# Patient Record
Sex: Female | Born: 1935
Health system: Southern US, Community
[De-identification: ages and names within clinical notes are randomized; demographics above are authoritative.]

## PROBLEM LIST (undated history)

## (undated) DIAGNOSIS — H548 Legal blindness, as defined in USA: Secondary | ICD-10-CM

## (undated) DIAGNOSIS — K5900 Constipation, unspecified: Secondary | ICD-10-CM

## (undated) DIAGNOSIS — S32000A Wedge compression fracture of unspecified lumbar vertebra, initial encounter for closed fracture: Secondary | ICD-10-CM

## (undated) DIAGNOSIS — M1990 Unspecified osteoarthritis, unspecified site: Secondary | ICD-10-CM

## (undated) DIAGNOSIS — M549 Dorsalgia, unspecified: Secondary | ICD-10-CM

## (undated) DIAGNOSIS — S72009A Fracture of unspecified part of neck of unspecified femur, initial encounter for closed fracture: Secondary | ICD-10-CM

## (undated) DIAGNOSIS — E871 Hypo-osmolality and hyponatremia: Secondary | ICD-10-CM

## (undated) DIAGNOSIS — M810 Age-related osteoporosis without current pathological fracture: Secondary | ICD-10-CM

## (undated) DIAGNOSIS — F419 Anxiety disorder, unspecified: Secondary | ICD-10-CM

## (undated) DIAGNOSIS — K219 Gastro-esophageal reflux disease without esophagitis: Secondary | ICD-10-CM

## (undated) DIAGNOSIS — R739 Hyperglycemia, unspecified: Secondary | ICD-10-CM

## (undated) DIAGNOSIS — C50912 Malignant neoplasm of unspecified site of left female breast: Secondary | ICD-10-CM

## (undated) DIAGNOSIS — S72142A Displaced intertrochanteric fracture of left femur, initial encounter for closed fracture: Secondary | ICD-10-CM

## (undated) DIAGNOSIS — R7303 Prediabetes: Secondary | ICD-10-CM

## (undated) DIAGNOSIS — H3530 Unspecified macular degeneration: Secondary | ICD-10-CM

## (undated) HISTORY — DX: Displaced intertrochanteric fracture of left femur, initial encounter for closed fracture: S72.142A

## (undated) HISTORY — PX: TONSILLECTOMY: SUR1361

## (undated) HISTORY — DX: Constipation, unspecified: K59.00

## (undated) HISTORY — DX: Hyperglycemia, unspecified: R73.9

## (undated) HISTORY — DX: Fracture of unspecified part of neck of unspecified femur, initial encounter for closed fracture: S72.009A

## (undated) HISTORY — PX: CATARACT EXTRACTION: SUR2

## (undated) HISTORY — DX: Hypo-osmolality and hyponatremia: E87.1

---

## 2007-08-28 ENCOUNTER — Encounter: Admission: RE | Admit: 2007-08-28 | Discharge: 2007-08-28 | Payer: Self-pay | Admitting: Orthopedic Surgery

## 2011-06-16 DIAGNOSIS — H31012 Macula scars of posterior pole (postinflammatory) (post-traumatic), left eye: Secondary | ICD-10-CM | POA: Insufficient documentation

## 2011-06-28 DIAGNOSIS — S32000A Wedge compression fracture of unspecified lumbar vertebra, initial encounter for closed fracture: Secondary | ICD-10-CM

## 2011-06-28 HISTORY — DX: Wedge compression fracture of unspecified lumbar vertebra, initial encounter for closed fracture: S32.000A

## 2011-11-04 ENCOUNTER — Emergency Department (HOSPITAL_COMMUNITY): Payer: Medicare Other

## 2011-11-04 ENCOUNTER — Encounter (HOSPITAL_COMMUNITY): Payer: Self-pay | Admitting: *Deleted

## 2011-11-04 ENCOUNTER — Emergency Department (HOSPITAL_COMMUNITY)
Admission: EM | Admit: 2011-11-04 | Discharge: 2011-11-04 | Disposition: A | Discharge status: Home / Self Care | Payer: Medicare Other | Attending: Emergency Medicine | Admitting: Emergency Medicine

## 2011-11-04 DIAGNOSIS — S32009A Unspecified fracture of unspecified lumbar vertebra, initial encounter for closed fracture: Secondary | ICD-10-CM | POA: Insufficient documentation

## 2011-11-04 DIAGNOSIS — M545 Low back pain, unspecified: Secondary | ICD-10-CM | POA: Insufficient documentation

## 2011-11-04 DIAGNOSIS — K5903 Drug induced constipation: Secondary | ICD-10-CM

## 2011-11-04 DIAGNOSIS — S32000A Wedge compression fracture of unspecified lumbar vertebra, initial encounter for closed fracture: Secondary | ICD-10-CM

## 2011-11-04 DIAGNOSIS — K5909 Other constipation: Secondary | ICD-10-CM | POA: Insufficient documentation

## 2011-11-04 DIAGNOSIS — X503XXA Overexertion from repetitive movements, initial encounter: Secondary | ICD-10-CM | POA: Insufficient documentation

## 2011-11-04 DIAGNOSIS — M129 Arthropathy, unspecified: Secondary | ICD-10-CM | POA: Insufficient documentation

## 2011-11-04 HISTORY — DX: Unspecified osteoarthritis, unspecified site: M19.90

## 2011-11-04 HISTORY — DX: Dorsalgia, unspecified: M54.9

## 2011-11-04 LAB — URINALYSIS, ROUTINE W REFLEX MICROSCOPIC
Glucose, UA: NEGATIVE mg/dL
Ketones, ur: 40 mg/dL — AB
Protein, ur: NEGATIVE mg/dL

## 2011-11-04 LAB — OCCULT BLOOD, POC DEVICE: Fecal Occult Bld: NEGATIVE

## 2011-11-04 MED ORDER — LUBIPROSTONE 24 MCG PO CAPS: 24.0000 ug | ORAL_CAPSULE | Freq: Two times a day (BID) | ORAL | Status: AC

## 2011-11-04 MED ORDER — DIPHENHYDRAMINE HCL 25 MG PO CAPS
ORAL_CAPSULE | ORAL | Status: AC
  Administered 2011-11-04: 12:00:00
  Filled 2011-11-04: qty 1

## 2011-11-04 MED ORDER — POLYETHYLENE GLYCOL 3350 17 GM/SCOOP PO POWD: 17.0000 g | Freq: Every day | ORAL | Status: AC

## 2011-11-04 MED ORDER — MORPHINE SULFATE 4 MG/ML IJ SOLN
4.0000 mg | Freq: Once | INTRAMUSCULAR | Status: AC
  Administered 2011-11-04: 4 mg via INTRAMUSCULAR
  Filled 2011-11-04: qty 1

## 2011-11-04 NOTE — ED Notes (Signed)
Patient transported to CT 

## 2011-11-04 NOTE — ED Notes (Signed)
Per ems: pt is having lower pain x 1. Pt is moving plants in her yard. Pt went to her pcp for pain no relief. Pt states she is also having decrease in appetite and constipation

## 2011-11-04 NOTE — ED Notes (Signed)
AVW:[id number]> Expected date:11/04/11<BR> Expected time: 9:14 AM<BR> Means of arrival:Ambulance<BR> Comments:<BR> Elderly,lower back pain

## 2011-11-04 NOTE — ED Provider Notes (Signed)
History     CSN: [id number]  Arrival date & time 11/04/11  7846   First MD Initiated Contact with Patient 11/04/11 [id number]      Chief Complaint  Patient presents with  . Back Pain    (Consider location/radiation/quality/duration/timing/severity/associated sxs/prior treatment) HPI  A generally healthy 78 year old female with history of arthritis presents with low back pain. Patient states for the past week she has been having pain to the low back. She described pain as a sharp and aching sensation worsening with movement and improves with rest. Pain initially started in the morning and at night but now has been ongoing throughout the day. Pain is nonradiating. Pain initially started with spasm sensation, worsening with positional changes.  Patient admits to having injured her back she was young due to heavy lifting. Sts she noticed that her back occasionally felt "weak, with occasional aches" but usually resolves with taking tylenol. Patient is very active and has been doing yard work more so than usual for the past several weeks.  She also has a job that has arthritis which requires for her to carry her 30lbs dog at least 8 times daily in and out of the house.  Pt also walks 45 minutes each day, which she is trying to continue walking but her back has been bothersome which limits her walk.  Pt was seen by her pcp for her pain and was given vicodin and muscle relaxant.  Pt has been taking her medication with some relief. Patient also complaining of having constipation for the past week and states her normal bowel routine is once daily.  Sts she doesn't have the urge to go.  Pt denies fever, rash, urinary or bowel incontinence, and no caudal equina sxs.  Pt denies nightsweats, or unexplained weight loss.    Past Medical History  Diagnosis Date  . Back pain   . Arthritis     Past Surgical History  Procedure Date  . Tonsillectomy     No family history on file.  History  Substance Use Topics   . Smoking status: Former Games developer  . Smokeless tobacco: Not on file  . Alcohol Use: Yes     wine    OB History    Grav Para Term Preterm Abortions TAB SAB Ect Mult Living                  Review of Systems  All other systems reviewed and are negative.    Allergies  Review of patient's allergies indicates no known allergies.  Home Medications  No current outpatient prescriptions on file.  BP 142/68  Pulse 83  Temp(Src) 98 F (36.7 C) (Oral)  Resp 17  SpO2 100%  Physical Exam  Nursing note and vitals reviewed. Constitutional: She is oriented to person, place, and time. She appears well-developed and well-nourished. No distress.       Awake, alert, nontoxic appearance  HENT:  Head: Atraumatic.  Eyes: Conjunctivae are normal. Right eye exhibits no discharge. Left eye exhibits no discharge.  Neck: Neck supple.  Cardiovascular: Normal rate and regular rhythm.   Pulmonary/Chest: Effort normal. No respiratory distress. She exhibits no tenderness.  Abdominal: Soft. There is no tenderness. There is no rebound.  Genitourinary: Rectum normal. Rectal exam shows no mass and anal tone normal. Guaiac negative stool.       Chaperone present  Musculoskeletal: She exhibits no tenderness.       Cervical back: Normal.       Thoracic back:  Normal.       Lumbar back: She exhibits decreased range of motion, tenderness and bony tenderness. She exhibits no swelling, no edema, no deformity and no laceration.       ROM appears intact, no obvious focal weakness  Neurological: She is alert and oriented to person, place, and time. She has normal strength. No sensory deficit. She exhibits normal muscle tone. She displays a negative Romberg sign. Coordination and gait normal. GCS eye subscore is 4. GCS verbal subscore is 5. GCS motor subscore is 6.  Reflex Scores:      Patellar reflexes are 2+ on the right side and 2+ on the left side.      Mental status and motor strength appears intact.  No foot  drop  Skin: No rash noted.  Psychiatric: She has a normal mood and affect.    ED Course  Procedures (including critical care time)  Labs Reviewed - No data to display No results found.   No diagnosis found.  Results for orders placed during the hospital encounter of 11/04/11  URINALYSIS, ROUTINE W REFLEX MICROSCOPIC      Component Value Range   Color, Urine YELLOW  YELLOW    APPearance CLOUDY (*) CLEAR    Specific Gravity, Urine 1.022  1.005 - 1.030    pH 7.0  5.0 - 8.0    Glucose, UA NEGATIVE  NEGATIVE (mg/dL)   Hgb urine dipstick NEGATIVE  NEGATIVE    Bilirubin Urine NEGATIVE  NEGATIVE    Ketones, ur 40 (*) NEGATIVE (mg/dL)   Protein, ur NEGATIVE  NEGATIVE (mg/dL)   Urobilinogen, UA 1.0  0.0 - 1.0 (mg/dL)   Nitrite NEGATIVE  NEGATIVE    Leukocytes, UA SMALL (*) NEGATIVE   URINE MICROSCOPIC-ADD ON      Component Value Range   Squamous Epithelial / LPF RARE  RARE    WBC, UA 0-2  <3 (WBC/hpf)   RBC / HPF 0-2  <3 (RBC/hpf)  OCCULT BLOOD, POC DEVICE      Component Value Range   Fecal Occult Bld NEGATIVE     Dg Lumbar Spine Complete  11/04/2011  *RADIOLOGY REPORT*  Clinical Data: Low back pain  LUMBAR SPINE - COMPLETE 4+ VIEW  Comparison: None.  Findings: Osteopenia.  Anatomic alignment.  There are compression deformities at L1 and T12.  Anterior cortical step off occurs at L1 worrisome for an acute element.  There is 20% loss of height anteriorly without obvious retropulsion.  T12 compression fracture has a more chronic appearance with 30% loss of height anteriorly and no obvious retropulsion.  Mild superior end plate depression at L4 is suspected with anterior cortical buckling.  IMPRESSION: Compression fractures of T12, L1, and L4 as described.  Superior endplate compression at L1 may be acute.  Further characterization with MRI can be performed.  Age of the T12 and L4 compression fractures are indeterminate.  Original Report Authenticated By: Donavan Burnet, M.D.   Dg Abd  Acute W/chest  11/04/2011  *RADIOLOGY REPORT*  Clinical Data: Abdominal pain with loss of left-sided constipation for past week.  ACUTE ABDOMEN SERIES (ABDOMEN 2 VIEW & CHEST 1 VIEW)  Comparison: Lumbar spine plain films same date.  No comparison chest x-ray.  Findings: Hyperinflation lungs with findings suggesting chronic obstructive pulmonary type changes with minimal peribronchial thickening. No infiltrate, congestive heart failure or pneumothorax.  No plain film evidence of pulmonary malignancy.  If this were of concern, CT imaging can be obtained for further delineation.  Heart size within normal limits.  Compression deformities lower thoracic / upper lumbar spine as seen on accompanying lumbar spine plain films.  Moderate amount of stool throughout the right colon / proximal transverse colon.  Gas filled slightly prominent sized small bowel loops.  No free intraperitoneal air.  IMPRESSION: Nonspecific bowel gas pattern with prominent amount of stool in the right colon and gas filled prominent sized small bowel loops. Please see above discussion.  Original Report Authenticated By: Fuller Canada, M.D.      MDM  Back pain likely secondary to inflammation due to arthralgia.  Constipation likely secondary to recent narcotic use.  Will perform rectal exam to r/o obstruction or impaction.  No red flags sign.    11:43 AM L-spine x-ray shows evidence of a compression fracture at T12, L1, and L4 of unknown age and a suspected acute fracture at L1.  My attending has seen xray and has discussed with patient.  Plan to have pt f/u with [facility] orthopedic for further management.  Discussion of kyphoplasty as option, pt acknowledge.  Will dc with stool softener, laxative and referral. Pt voice understanding.       Fayrene Helper, PA-C 11/04/11 1158

## 2011-11-04 NOTE — Discharge Instructions (Signed)
It appears that you have compression fracture of your lower back at T12, L1, and L4.  Please follow up with your orthopedic doctor for further evaluation.  Your constipation is likely due to pain medication.  Take stool softener as prescribed.  Return to ER if your symptoms worsen.  Avoid heavy lifting or strenuous work in the mean time.    Back, Compression Fracture A compression fracture happens when a force is put upon the length of your spine. Slipping and falling on your bottom are examples of such a force. When this happens, sometimes the force is great enough to compress the building blocks (vertebral bodies) of your spine. Although this causes a lot of pain, this can usually be treated at home, unless your caregiver feels hospitalization is needed for pain control. Your backbone (spinal column) is made up of 24 main vertebral bodies in addition to the sacrum and coccyx (see illustration). These are held together by tough fibrous tissues (ligaments) and by support of your muscles. Nerve roots pass through the openings between the vertebrae. A sudden wrenching move, injury, or a fall may cause a compression fracture of one of the vertebral bodies. This may result in back pain or spread of pain into the belly (abdomen), the buttocks, and down the leg into the foot. Pain may also be created by muscle spasm alone. Large studies have been undertaken to determine the best possible course of action to help your back following injury and also to prevent future problems. The recommendations are as follows. FOLLOWING A COMPRESSION FRACTURE: Do the following only if advised by your caregiver.   If a back brace has been suggested or provided, wear it as directed.   DO NOT stop wearing the back brace unless instructed by your caregiver.   When allowed to return to regular activities, avoid a sedentary life style. Actively exercise. Sporadic weekend binges of tennis, racquetball, water skiing, may actually  aggravate or create problems, especially if you are not in condition for that activity.   Avoid sports requiring sudden body movements until you are in condition for them. Swimming and walking are safer activities.   Maintain good posture.   Avoid obesity.   If not already done, you should have a DEXA scan. Based on the results, be treated for osteoporosis.  FOLLOWING ACUTE (SUDDEN) INJURY:  Only take over-the-counter or prescription medicines for pain, discomfort, or fever as directed by your caregiver.   Use bed rest for only the most extreme acute episode. Prolonged bed rest may aggravate your condition. Ice used for acute conditions is effective. Use a large plastic bag filled with ice. Wrap it in a towel. This also provides excellent pain relief. This may be continuous. Or use it for 30 minutes every 2 hours during acute phase, then as needed. Heat for 30 minutes prior to activities is helpful.   As soon as the acute phase (the time when your back is too painful for you to do normal activities) is over, it is important to resume normal activities and work Arboriculturist. Back injuries can cause potentially marked changes in lifestyle. So it is important to attack these problems aggressively.   See your caregiver for continued problems. He or she can help or refer you for appropriate exercises, physical therapy and work hardening if needed.   If you are given narcotic medications for your condition, for the next 24 hours DO NOT:   Drive   Operate machinery or power tools.  Sign legal documents.   DO NOT drink alcohol, take sleeping pills or other medications that may interfere with treatment.  If your caregiver has given you a follow-up appointment, it is very important to keep that appointment. Not keeping the appointment could result in a chronic or permanent injury, pain, and disability. If there is any problem keeping the appointment, you must call back to this facility for  assistance.  SEEK IMMEDIATE MEDICAL CARE IF:  You develop numbness, tingling, weakness, or problems with the use of your arms or legs.   You develop severe back pain not relieved with medications.   You have changes in bowel or bladder control.   You have increasing pain in any areas of the body.  Document Released: 06/13/2005 Document Revised: 06/02/2011 Document Reviewed: 01/16/2008 [facility] Patient Information 2012 [place], [place].  Constipation in Adults Constipation is having fewer than 2 bowel movements per week. Usually, the stools are hard. As we grow older, constipation is more common. If you try to fix constipation with laxatives, the problem may get worse. This is because laxatives taken over a long period of time make the colon muscles weaker. A low-fiber diet, not taking in enough fluids, and taking some medicines may make these problems worse. MEDICATIONS THAT MAY CAUSE CONSTIPATION  Water pills (diuretics).   Calcium channel blockers (used to control blood pressure and for the heart).   Certain pain medicines (narcotics).   Anticholinergics.   Anti-inflammatory agents.   Antacids that contain aluminum.  DISEASES THAT CONTRIBUTE TO CONSTIPATION  Diabetes.   Parkinson's disease.   Dementia.   Stroke.   Depression.   Illnesses that cause problems with salt and water metabolism.  HOME CARE INSTRUCTIONS   Constipation is usually best cared for without medicines. Increasing dietary fiber and eating more fruits and vegetables is the best way to manage constipation.   Slowly increase fiber intake to 25 to 38 grams per day. Whole grains, fruits, vegetables, and legumes are good sources of fiber. A dietitian can further help you incorporate high-fiber foods into your diet.   Drink enough water and fluids to keep your urine clear or pale yellow.   A fiber supplement may be added to your diet if you cannot get enough fiber from foods.   Increasing your activities  also helps improve regularity.   Suppositories, as suggested by your caregiver, will also help. If you are using antacids, such as aluminum or calcium containing products, it will be helpful to switch to products containing magnesium if your caregiver says it is okay.   If you have been given a liquid injection (enema) today, this is only a temporary measure. It should not be relied on for treatment of longstanding (chronic) constipation.   Stronger measures, such as magnesium sulfate, should be avoided if possible. This may cause uncontrollable diarrhea. Using magnesium sulfate may not allow you time to make it to the bathroom.  SEEK IMMEDIATE MEDICAL CARE IF:   There is bright red blood in the stool.   The constipation stays for more than 4 days.   There is belly (abdominal) or rectal pain.   You do not seem to be getting better.   You have any questions or concerns.  MAKE SURE YOU:   Understand these instructions.   Will watch your condition.   Will get help right away if you are not doing well or get worse.  Document Released: 03/11/2004 Document Revised: 06/02/2011 Document Reviewed: 05/17/2011 [facility] Patient Information 2012 [place]. 

## 2011-11-05 NOTE — ED Provider Notes (Signed)
Medical screening examination/treatment/procedure(s) were conducted as a shared visit with non-physician practitioner(s) and myself.  I personally evaluated the patient during the encounter Pt w low back pain, worse w bending, movement/turning. Compression fxs on xr. No numbness/weakness. No gi or gu c/o. abd no puls mass, no tenderness. Pain rx.   Suzi Roots, MD 11/05/11 [phone]

## 2013-03-20 DIAGNOSIS — H401114 Primary open-angle glaucoma, right eye, indeterminate stage: Secondary | ICD-10-CM | POA: Insufficient documentation

## 2013-07-11 DIAGNOSIS — H353290 Exudative age-related macular degeneration, unspecified eye, stage unspecified: Secondary | ICD-10-CM | POA: Diagnosis not present

## 2013-07-11 DIAGNOSIS — H2510 Age-related nuclear cataract, unspecified eye: Secondary | ICD-10-CM | POA: Diagnosis not present

## 2013-07-11 DIAGNOSIS — H31019 Macula scars of posterior pole (postinflammatory) (post-traumatic), unspecified eye: Secondary | ICD-10-CM | POA: Diagnosis not present

## 2013-07-11 DIAGNOSIS — H4011X0 Primary open-angle glaucoma, stage unspecified: Secondary | ICD-10-CM | POA: Diagnosis not present

## 2013-07-11 DIAGNOSIS — H409 Unspecified glaucoma: Secondary | ICD-10-CM | POA: Diagnosis not present

## 2013-09-19 DIAGNOSIS — H353290 Exudative age-related macular degeneration, unspecified eye, stage unspecified: Secondary | ICD-10-CM | POA: Diagnosis not present

## 2013-11-06 DIAGNOSIS — H2510 Age-related nuclear cataract, unspecified eye: Secondary | ICD-10-CM | POA: Diagnosis not present

## 2013-11-06 DIAGNOSIS — H409 Unspecified glaucoma: Secondary | ICD-10-CM | POA: Diagnosis not present

## 2013-11-06 DIAGNOSIS — H3530 Unspecified macular degeneration: Secondary | ICD-10-CM | POA: Diagnosis not present

## 2013-11-06 DIAGNOSIS — H4011X0 Primary open-angle glaucoma, stage unspecified: Secondary | ICD-10-CM | POA: Diagnosis not present

## 2013-11-28 DIAGNOSIS — H353290 Exudative age-related macular degeneration, unspecified eye, stage unspecified: Secondary | ICD-10-CM | POA: Diagnosis not present

## 2013-11-29 DIAGNOSIS — H2510 Age-related nuclear cataract, unspecified eye: Secondary | ICD-10-CM | POA: Diagnosis not present

## 2013-12-05 DIAGNOSIS — I209 Angina pectoris, unspecified: Secondary | ICD-10-CM | POA: Diagnosis not present

## 2013-12-05 DIAGNOSIS — H4010X0 Unspecified open-angle glaucoma, stage unspecified: Secondary | ICD-10-CM | POA: Diagnosis not present

## 2013-12-05 DIAGNOSIS — H409 Unspecified glaucoma: Secondary | ICD-10-CM | POA: Diagnosis not present

## 2013-12-05 DIAGNOSIS — H2510 Age-related nuclear cataract, unspecified eye: Secondary | ICD-10-CM | POA: Diagnosis not present

## 2013-12-05 DIAGNOSIS — Z79899 Other long term (current) drug therapy: Secondary | ICD-10-CM | POA: Diagnosis not present

## 2013-12-05 DIAGNOSIS — H2589 Other age-related cataract: Secondary | ICD-10-CM | POA: Diagnosis not present

## 2013-12-05 DIAGNOSIS — H538 Other visual disturbances: Secondary | ICD-10-CM | POA: Diagnosis not present

## 2013-12-05 DIAGNOSIS — Z87891 Personal history of nicotine dependence: Secondary | ICD-10-CM | POA: Diagnosis not present

## 2013-12-06 DIAGNOSIS — Z961 Presence of intraocular lens: Secondary | ICD-10-CM | POA: Insufficient documentation

## 2013-12-17 DIAGNOSIS — H2510 Age-related nuclear cataract, unspecified eye: Secondary | ICD-10-CM | POA: Diagnosis not present

## 2013-12-17 DIAGNOSIS — H31019 Macula scars of posterior pole (postinflammatory) (post-traumatic), unspecified eye: Secondary | ICD-10-CM | POA: Diagnosis not present

## 2013-12-17 DIAGNOSIS — H409 Unspecified glaucoma: Secondary | ICD-10-CM | POA: Diagnosis not present

## 2013-12-17 DIAGNOSIS — Z79899 Other long term (current) drug therapy: Secondary | ICD-10-CM | POA: Diagnosis not present

## 2013-12-17 DIAGNOSIS — Z87891 Personal history of nicotine dependence: Secondary | ICD-10-CM | POA: Diagnosis not present

## 2013-12-17 DIAGNOSIS — H3530 Unspecified macular degeneration: Secondary | ICD-10-CM | POA: Diagnosis not present

## 2013-12-17 DIAGNOSIS — H2589 Other age-related cataract: Secondary | ICD-10-CM | POA: Diagnosis not present

## 2013-12-18 DIAGNOSIS — Z961 Presence of intraocular lens: Secondary | ICD-10-CM | POA: Diagnosis not present

## 2014-02-06 DIAGNOSIS — Z961 Presence of intraocular lens: Secondary | ICD-10-CM | POA: Insufficient documentation

## 2014-02-06 DIAGNOSIS — H353290 Exudative age-related macular degeneration, unspecified eye, stage unspecified: Secondary | ICD-10-CM | POA: Diagnosis not present

## 2014-02-07 DIAGNOSIS — H26491 Other secondary cataract, right eye: Secondary | ICD-10-CM | POA: Insufficient documentation

## 2014-04-08 DIAGNOSIS — Z23 Encounter for immunization: Secondary | ICD-10-CM | POA: Diagnosis not present

## 2014-04-17 DIAGNOSIS — Z961 Presence of intraocular lens: Secondary | ICD-10-CM | POA: Diagnosis not present

## 2014-04-17 DIAGNOSIS — H31012 Macula scars of posterior pole (postinflammatory) (post-traumatic), left eye: Secondary | ICD-10-CM | POA: Diagnosis not present

## 2014-04-17 DIAGNOSIS — H3532 Exudative age-related macular degeneration: Secondary | ICD-10-CM | POA: Diagnosis not present

## 2014-04-17 DIAGNOSIS — H4011X4 Primary open-angle glaucoma, indeterminate stage: Secondary | ICD-10-CM | POA: Diagnosis not present

## 2014-04-18 DIAGNOSIS — H26491 Other secondary cataract, right eye: Secondary | ICD-10-CM | POA: Diagnosis not present

## 2014-06-04 DIAGNOSIS — Z23 Encounter for immunization: Secondary | ICD-10-CM | POA: Diagnosis not present

## 2014-06-27 HISTORY — PX: HIP FRACTURE SURGERY: SHX118

## 2014-07-03 DIAGNOSIS — H31012 Macula scars of posterior pole (postinflammatory) (post-traumatic), left eye: Secondary | ICD-10-CM | POA: Diagnosis not present

## 2014-07-03 DIAGNOSIS — H4011X4 Primary open-angle glaucoma, indeterminate stage: Secondary | ICD-10-CM | POA: Diagnosis not present

## 2014-07-03 DIAGNOSIS — H3532 Exudative age-related macular degeneration: Secondary | ICD-10-CM | POA: Diagnosis not present

## 2014-07-03 DIAGNOSIS — Z961 Presence of intraocular lens: Secondary | ICD-10-CM | POA: Diagnosis not present

## 2014-09-18 DIAGNOSIS — H4011X4 Primary open-angle glaucoma, indeterminate stage: Secondary | ICD-10-CM | POA: Diagnosis not present

## 2014-09-18 DIAGNOSIS — Z961 Presence of intraocular lens: Secondary | ICD-10-CM | POA: Diagnosis not present

## 2014-09-18 DIAGNOSIS — H31012 Macula scars of posterior pole (postinflammatory) (post-traumatic), left eye: Secondary | ICD-10-CM | POA: Diagnosis not present

## 2014-09-18 DIAGNOSIS — H3532 Exudative age-related macular degeneration: Secondary | ICD-10-CM | POA: Diagnosis not present

## 2014-11-27 DIAGNOSIS — H3532 Exudative age-related macular degeneration: Secondary | ICD-10-CM | POA: Diagnosis not present

## 2015-03-05 DIAGNOSIS — H31012 Macula scars of posterior pole (postinflammatory) (post-traumatic), left eye: Secondary | ICD-10-CM | POA: Diagnosis not present

## 2015-03-05 DIAGNOSIS — H4011X4 Primary open-angle glaucoma, indeterminate stage: Secondary | ICD-10-CM | POA: Diagnosis not present

## 2015-03-05 DIAGNOSIS — Z961 Presence of intraocular lens: Secondary | ICD-10-CM | POA: Diagnosis not present

## 2015-03-05 DIAGNOSIS — H3532 Exudative age-related macular degeneration: Secondary | ICD-10-CM | POA: Diagnosis not present

## 2015-03-05 DIAGNOSIS — H2513 Age-related nuclear cataract, bilateral: Secondary | ICD-10-CM | POA: Diagnosis not present

## 2015-03-23 ENCOUNTER — Emergency Department (HOSPITAL_COMMUNITY): Payer: Medicare Other

## 2015-03-23 ENCOUNTER — Encounter (HOSPITAL_COMMUNITY): Payer: Self-pay | Admitting: Emergency Medicine

## 2015-03-23 ENCOUNTER — Inpatient Hospital Stay (HOSPITAL_COMMUNITY)
Admission: EM | Admit: 2015-03-23 | Discharge: 2015-03-26 | DRG: 481 | Disposition: A | Discharge status: Skilled Nursing Facility | Payer: Medicare Other | Attending: Family Medicine | Admitting: Family Medicine

## 2015-03-23 DIAGNOSIS — E440 Moderate protein-calorie malnutrition: Secondary | ICD-10-CM | POA: Diagnosis present

## 2015-03-23 DIAGNOSIS — M25552 Pain in left hip: Secondary | ICD-10-CM | POA: Diagnosis present

## 2015-03-23 DIAGNOSIS — S32000A Wedge compression fracture of unspecified lumbar vertebra, initial encounter for closed fracture: Secondary | ICD-10-CM | POA: Insufficient documentation

## 2015-03-23 DIAGNOSIS — E1165 Type 2 diabetes mellitus with hyperglycemia: Secondary | ICD-10-CM | POA: Diagnosis present

## 2015-03-23 DIAGNOSIS — W541XXA Struck by dog, initial encounter: Secondary | ICD-10-CM | POA: Diagnosis not present

## 2015-03-23 DIAGNOSIS — E871 Hypo-osmolality and hyponatremia: Secondary | ICD-10-CM | POA: Diagnosis present

## 2015-03-23 DIAGNOSIS — W1839XA Other fall on same level, initial encounter: Secondary | ICD-10-CM | POA: Diagnosis not present

## 2015-03-23 DIAGNOSIS — S72142C Displaced intertrochanteric fracture of left femur, initial encounter for open fracture type IIIA, IIIB, or IIIC: Secondary | ICD-10-CM | POA: Diagnosis not present

## 2015-03-23 DIAGNOSIS — S72102A Unspecified trochanteric fracture of left femur, initial encounter for closed fracture: Secondary | ICD-10-CM | POA: Diagnosis not present

## 2015-03-23 DIAGNOSIS — R739 Hyperglycemia, unspecified: Secondary | ICD-10-CM | POA: Diagnosis not present

## 2015-03-23 DIAGNOSIS — S72142A Displaced intertrochanteric fracture of left femur, initial encounter for closed fracture: Secondary | ICD-10-CM | POA: Diagnosis not present

## 2015-03-23 DIAGNOSIS — Z87891 Personal history of nicotine dependence: Secondary | ICD-10-CM

## 2015-03-23 DIAGNOSIS — S72142S Displaced intertrochanteric fracture of left femur, sequela: Secondary | ICD-10-CM | POA: Diagnosis not present

## 2015-03-23 DIAGNOSIS — M1990 Unspecified osteoarthritis, unspecified site: Secondary | ICD-10-CM | POA: Diagnosis present

## 2015-03-23 DIAGNOSIS — Z9181 History of falling: Secondary | ICD-10-CM | POA: Diagnosis not present

## 2015-03-23 DIAGNOSIS — M810 Age-related osteoporosis without current pathological fracture: Secondary | ICD-10-CM | POA: Diagnosis not present

## 2015-03-23 DIAGNOSIS — M6281 Muscle weakness (generalized): Secondary | ICD-10-CM | POA: Diagnosis not present

## 2015-03-23 DIAGNOSIS — Z0389 Encounter for observation for other suspected diseases and conditions ruled out: Secondary | ICD-10-CM | POA: Diagnosis not present

## 2015-03-23 DIAGNOSIS — M21259 Flexion deformity, unspecified hip: Secondary | ICD-10-CM | POA: Diagnosis not present

## 2015-03-23 DIAGNOSIS — Z4789 Encounter for other orthopedic aftercare: Secondary | ICD-10-CM | POA: Diagnosis not present

## 2015-03-23 DIAGNOSIS — R278 Other lack of coordination: Secondary | ICD-10-CM | POA: Diagnosis not present

## 2015-03-23 DIAGNOSIS — H548 Legal blindness, as defined in USA: Secondary | ICD-10-CM | POA: Diagnosis present

## 2015-03-23 DIAGNOSIS — E876 Hypokalemia: Secondary | ICD-10-CM | POA: Diagnosis not present

## 2015-03-23 DIAGNOSIS — R262 Difficulty in walking, not elsewhere classified: Secondary | ICD-10-CM | POA: Diagnosis not present

## 2015-03-23 DIAGNOSIS — Z09 Encounter for follow-up examination after completed treatment for conditions other than malignant neoplasm: Secondary | ICD-10-CM

## 2015-03-23 DIAGNOSIS — S72145D Nondisplaced intertrochanteric fracture of left femur, subsequent encounter for closed fracture with routine healing: Secondary | ICD-10-CM | POA: Diagnosis not present

## 2015-03-23 DIAGNOSIS — M79605 Pain in left leg: Secondary | ICD-10-CM | POA: Diagnosis not present

## 2015-03-23 DIAGNOSIS — M549 Dorsalgia, unspecified: Secondary | ICD-10-CM | POA: Insufficient documentation

## 2015-03-23 DIAGNOSIS — E119 Type 2 diabetes mellitus without complications: Secondary | ICD-10-CM | POA: Diagnosis not present

## 2015-03-23 DIAGNOSIS — S72002A Fracture of unspecified part of neck of left femur, initial encounter for closed fracture: Secondary | ICD-10-CM | POA: Diagnosis not present

## 2015-03-23 DIAGNOSIS — Z681 Body mass index (BMI) 19 or less, adult: Secondary | ICD-10-CM | POA: Diagnosis not present

## 2015-03-23 DIAGNOSIS — S72001A Fracture of unspecified part of neck of right femur, initial encounter for closed fracture: Secondary | ICD-10-CM | POA: Diagnosis not present

## 2015-03-23 DIAGNOSIS — Z79899 Other long term (current) drug therapy: Secondary | ICD-10-CM

## 2015-03-23 DIAGNOSIS — T148 Other injury of unspecified body region: Secondary | ICD-10-CM | POA: Diagnosis not present

## 2015-03-23 DIAGNOSIS — S72009A Fracture of unspecified part of neck of unspecified femur, initial encounter for closed fracture: Secondary | ICD-10-CM | POA: Diagnosis present

## 2015-03-23 DIAGNOSIS — S72132A Displaced apophyseal fracture of left femur, initial encounter for closed fracture: Secondary | ICD-10-CM | POA: Diagnosis not present

## 2015-03-23 HISTORY — DX: Age-related osteoporosis without current pathological fracture: M81.0

## 2015-03-23 HISTORY — DX: Anxiety disorder, unspecified: F41.9

## 2015-03-23 HISTORY — DX: Prediabetes: R73.03

## 2015-03-23 HISTORY — DX: Gastro-esophageal reflux disease without esophagitis: K21.9

## 2015-03-23 HISTORY — DX: Wedge compression fracture of unspecified lumbar vertebra, initial encounter for closed fracture: S32.000A

## 2015-03-23 HISTORY — DX: Unspecified macular degeneration: H35.30

## 2015-03-23 LAB — CBC WITH DIFFERENTIAL/PLATELET
BASOS ABS: 0 10*3/uL (ref 0.0–0.1)
BASOS PCT: 0 %
EOS ABS: 0.1 10*3/uL (ref 0.0–0.7)
Eosinophils Relative: 1 %
HCT: 39.1 % (ref 36.0–46.0)
HEMOGLOBIN: 13.5 g/dL (ref 12.0–15.0)
Lymphocytes Relative: 9 %
Lymphs Abs: 0.8 10*3/uL (ref 0.7–4.0)
MCH: 32 pg (ref 26.0–34.0)
MCHC: 34.5 g/dL (ref 30.0–36.0)
MCV: 92.7 fL (ref 78.0–100.0)
MONOS PCT: 3 %
Monocytes Absolute: 0.3 10*3/uL (ref 0.1–1.0)
NEUTROS PCT: 87 %
Neutro Abs: 7.5 10*3/uL (ref 1.7–7.7)
Platelets: 194 10*3/uL (ref 150–400)
RBC: 4.22 MIL/uL (ref 3.87–5.11)
RDW: 12.9 % (ref 11.5–15.5)
WBC: 8.8 10*3/uL (ref 4.0–10.5)

## 2015-03-23 LAB — BASIC METABOLIC PANEL
ANION GAP: 7 (ref 5–15)
BUN: 18 mg/dL (ref 6–20)
CO2: 29 mmol/L (ref 22–32)
Calcium: 8.8 mg/dL — ABNORMAL LOW (ref 8.9–10.3)
Chloride: 96 mmol/L — ABNORMAL LOW (ref 101–111)
Creatinine, Ser: 0.51 mg/dL (ref 0.44–1.00)
GLUCOSE: 122 mg/dL — AB (ref 65–99)
Potassium: 3.5 mmol/L (ref 3.5–5.1)
SODIUM: 132 mmol/L — AB (ref 135–145)

## 2015-03-23 LAB — PROTIME-INR
INR: 0.98 (ref 0.00–1.49)
PROTHROMBIN TIME: 13.2 s (ref 11.6–15.2)

## 2015-03-23 MED ORDER — TRAZODONE HCL 50 MG PO TABS: 25.0000 mg | ORAL_TABLET | Freq: Every evening | ORAL | Status: DC | PRN

## 2015-03-23 MED ORDER — MORPHINE SULFATE (PF) 2 MG/ML IV SOLN: 1.0000 mg | INTRAVENOUS | Status: DC | PRN

## 2015-03-23 MED ORDER — CHLORHEXIDINE GLUCONATE 4 % EX LIQD
60.0000 mL | Freq: Once | CUTANEOUS | Status: AC
  Administered 2015-03-24: 4 via TOPICAL
  Filled 2015-03-23: qty 60

## 2015-03-23 MED ORDER — CEFAZOLIN SODIUM-DEXTROSE 2-3 GM-% IV SOLR
2.0000 g | INTRAVENOUS | Status: AC
  Administered 2015-03-24: 2 g via INTRAVENOUS
  Filled 2015-03-23: qty 50

## 2015-03-23 MED ORDER — SODIUM CHLORIDE 0.9 % IV SOLN
INTRAVENOUS | Status: DC
  Administered 2015-03-23: via INTRAVENOUS

## 2015-03-23 MED ORDER — MORPHINE SULFATE (PF) 2 MG/ML IV SOLN
1.0000 mg | INTRAVENOUS | Status: DC | PRN
  Administered 2015-03-23 – 2015-03-24 (×3): 2 mg via INTRAVENOUS
  Filled 2015-03-23 (×3): qty 1

## 2015-03-23 MED ORDER — ONDANSETRON HCL 4 MG/2ML IJ SOLN: 4.0000 mg | Freq: Four times a day (QID) | INTRAMUSCULAR | Status: DC | PRN

## 2015-03-23 MED ORDER — ACETAMINOPHEN 325 MG PO TABS: 650.0000 mg | ORAL_TABLET | Freq: Four times a day (QID) | ORAL | Status: DC | PRN

## 2015-03-23 MED ORDER — ACETAMINOPHEN 650 MG RE SUPP: 650.0000 mg | Freq: Four times a day (QID) | RECTAL | Status: DC | PRN

## 2015-03-23 MED ORDER — LATANOPROST 0.005 % OP SOLN
1.0000 [drp] | Freq: Every day | OPHTHALMIC | Status: DC
  Administered 2015-03-23 – 2015-03-25 (×3): 1 [drp] via OPHTHALMIC
  Filled 2015-03-23: qty 2.5

## 2015-03-23 MED ORDER — DORZOLAMIDE HCL-TIMOLOL MAL 2-0.5 % OP SOLN
1.0000 [drp] | Freq: Two times a day (BID) | OPHTHALMIC | Status: DC
  Administered 2015-03-23 – 2015-03-26 (×6): 1 [drp] via OPHTHALMIC
  Filled 2015-03-23: qty 10

## 2015-03-23 MED ORDER — METHOCARBAMOL 1000 MG/10ML IJ SOLN
500.0000 mg | Freq: Four times a day (QID) | INTRAMUSCULAR | Status: DC | PRN
  Filled 2015-03-23: qty 5

## 2015-03-23 MED ORDER — DOCUSATE SODIUM 100 MG PO CAPS
100.0000 mg | ORAL_CAPSULE | Freq: Two times a day (BID) | ORAL | Status: DC
  Administered 2015-03-25 – 2015-03-26 (×3): 100 mg via ORAL
  Filled 2015-03-23 (×3): qty 1

## 2015-03-23 MED ORDER — ONDANSETRON HCL 4 MG PO TABS: 4.0000 mg | ORAL_TABLET | Freq: Four times a day (QID) | ORAL | Status: DC | PRN

## 2015-03-23 NOTE — ED Provider Notes (Signed)
CSN: [id number]     Arrival date & time 03/23/15  1131 History   First MD Initiated Contact with Patient 03/23/15 1221     Chief Complaint  Patient presents with  . Hip Pain     (Consider location/radiation/quality/duration/timing/severity/associated sxs/prior Treatment) HPI Comments: Patient fell onto left hip as her friend's dog jumped on her pushing her to the ground. Did not hit head or lose consciousness. Complains of pain and left systems unable to bear weight. Denies neck or back pain. History of compression fracture in the past. No focal weakness, numbness or tingling. No chest pain or shortness of breath. She does not take any blood thinners. She has poor vision at baseline.  Patient is a 81 y.o. female presenting with hip pain. The history is provided by the patient and the EMS personnel.  Hip Pain Pertinent negatives include no chest pain, no abdominal pain and no shortness of breath.    Past Medical History  Diagnosis Date  . Back pain   . Arthritis   . Osteoporosis   . Compression fracture of lumbar vertebra 2013   Past Surgical History  Procedure Laterality Date  . Tonsillectomy     History reviewed. No pertinent family history. Social History  Substance Use Topics  . Smoking status: Former Research scientist (life sciences)  . Smokeless tobacco: None  . Alcohol Use: Yes     Comment: wine   OB History    No data available     Review of Systems  Constitutional: Negative for activity change and appetite change.  Respiratory: Negative for cough, chest tightness and shortness of breath.   Cardiovascular: Negative for chest pain.  Gastrointestinal: Negative for nausea, vomiting and abdominal pain.  Genitourinary: Negative for dysuria and hematuria.  Musculoskeletal: Positive for myalgias and arthralgias. Negative for back pain and neck pain.  Skin: Negative for rash.  Neurological: Negative for dizziness and weakness.  A complete 10 system review of systems was obtained and all systems  are negative except as noted in the HPI and PMH.      Allergies  Review of patient's allergies indicates no known allergies.  Home Medications   Prior to Admission medications   Medication Sig Start Date End Date Taking? Authorizing Provider  acetaminophen (TYLENOL) 500 MG tablet Take 1,000 mg by mouth every 6 (six) hours as needed. For pain.   Yes Historical Provider, MD  AMINO ACIDS PO Take 1 tablet by mouth daily.   Yes Historical Provider, MD  cholecalciferol (VITAMIN D) 1000 UNITS tablet Take 2,000 Units by mouth daily.   Yes Historical Provider, MD  dorzolamide-timolol (COSOPT) 22.3-6.8 MG/ML ophthalmic solution Place 1 drop into the right eye 2 (two) times daily.   Yes Historical Provider, MD  Multiple Vitamin (MULITIVITAMIN WITH MINERALS) TABS Take 1 tablet by mouth daily.   Yes Historical Provider, MD  Multiple Vitamins-Minerals (PRESERVISION AREDS PO) Take 1 capsule by mouth daily.   Yes Historical Provider, MD  omega-3 acid ethyl esters (LOVAZA) 1 G capsule Take 1 g by mouth daily.   Yes Historical Provider, MD  TRAVATAN Z 0.004 % SOLN ophthalmic solution Place 1 drop into the right eye at bedtime.  01/08/15  Yes Historical Provider, MD   BP 132/61 mmHg  Pulse 73  Temp(Src) 97.8 F (36.6 C) (Oral)  Resp 11  Ht 5\' 10"  (1.778 m)  Wt 110 lb (49.896 kg)  BMI 15.78 kg/m2  SpO2 100% Physical Exam  Constitutional: She is oriented to person, place, and time. She  appears well-developed and well-nourished. No distress.  HENT:  Head: Normocephalic and atraumatic.  Mouth/Throat: Oropharynx is clear and moist. No oropharyngeal exudate.  Eyes: Conjunctivae and EOM are normal. Pupils are equal, round, and reactive to light.  Neck: Normal range of motion. Neck supple.  No meningismus.  Cardiovascular: Normal rate, regular rhythm, normal heart sounds and intact distal pulses.   No murmur heard. Pulmonary/Chest: Effort normal and breath sounds normal. No respiratory distress.   Abdominal: Soft. There is no tenderness. There is no rebound and no guarding.  Musculoskeletal: She exhibits edema and tenderness.  Left hip shortened and externally rotated. Tenderness anterior laterally. Intact DP pulses.  No C-spine tenderness. No T or L-spine tenderness  Neurological: She is alert and oriented to person, place, and time. No cranial nerve deficit. She exhibits normal muscle tone. Coordination normal.  No ataxia on finger to nose bilaterally. No pronator drift. 5/5 strength throughout. CN 2-12 intact. Equal grip strength. Sensation intact.  Skin: Skin is warm.  Psychiatric: She has a normal mood and affect. Her behavior is normal.  Nursing note and vitals reviewed.   ED Course  Procedures (including critical care time) Labs Review Labs Reviewed  BASIC METABOLIC PANEL - Abnormal; Notable for the following:    Sodium 132 (*)    Chloride 96 (*)    Glucose, Bld 122 (*)    Calcium 8.8 (*)    All other components within normal limits  CBC WITH DIFFERENTIAL/PLATELET    Imaging Review Ct Hip Left Wo Contrast  03/23/2015   CLINICAL DATA:  Pt reports dog jumped up on her and pushed her down, landing on left hip. Pt c/o hip pain, no deformity or weakness.  EXAM: CT OF THE LEFT HIP WITHOUT CONTRAST  TECHNIQUE: Multidetector CT imaging of the left hip was performed according to the standard protocol. Multiplanar CT image reconstructions were also generated.  COMPARISON:  None.  FINDINGS: There is a mildly comminuted left femoral basicervical fracture with an intertrochanteric component and a fracture cleft extending into the proximal femoral diaphysis. There is no dislocation. There is no lytic or sclerotic osseous lesion. There is severe osteopenia. There is mild osteoarthritis of the left hip.  There is sequela of prior avulsive injury of the left hamstring origin.  There is fat stranding anterior to the rectus femoris muscle likely representing a small amount of hemorrhage.  There is no focal fluid collection or hematoma.  IMPRESSION: 1. Mildly comminuted left femoral basicervical fracture with an intertrochanteric component and a fracture cleft extending into the proximal femoral diaphysis along the medial aspect. There is no dislocation.   Electronically Signed   By: Kathreen Devoid   On: 03/23/2015 14:33   Dg Hip Unilat With Pelvis 2-3 Views Left  03/23/2015   CLINICAL DATA:  Left hip pain  EXAM: DG HIP (WITH OR WITHOUT PELVIS) 2-3V LEFT  COMPARISON:  None  FINDINGS: Mild to moderate bilateral hip osteoarthritis. There is moderate diffuse osteopenia. Suspicious lucency extending through the intertrochanteric portion of the proximal left femur is identified. No dislocation.  IMPRESSION: 1. Examination is suspicious for intertrochanteric fracture the proximal left femur. Suggests confirmatory imaging with cross-sectional exam either MRI or CT. Consider further evaluation with cross-sectional imaging. The study of choice would be a MRI.   Electronically Signed   By: Kerby Moors M.D.   On: 03/23/2015 13:03   I have personally reviewed and evaluated these images and lab results as part of my medical decision-making.  EKG Interpretation   Date/Time:  Monday March 23 2015 12:53:50 EDT Ventricular Rate:  71 PR Interval:  181 QRS Duration: 110 QT Interval:  406 QTC Calculation: 441 R Axis:   81 Text Interpretation:  Sinus rhythm Borderline right axis deviation No  previous ECGs available Confirmed by Wyvonnia Dusky  MD, Jaisean Monteforte [phone]) on  03/23/2015 1:24:28 PM      MDM   Final diagnoses:  Intertrochanteric fracture of left hip, closed, initial encounter   Fall with concern for hip fracture. Did not hit head. No neck or back pain. No blood thinner use. No chest pain, abdominal pain or back pain.  Concern for hip fracture. Neurovascularly intact.  X-ray shows probable intertrochanteric fracture of the left proximal femur.  No orthopedic coverage at [facility].  Patient has seen [facility] orthopedics in the past. Discussed with Dr. Veverly Fells who accepted patient to [facility] for surgical repair today.  Hospitalist admission discussed with Dr. Jerilee Hoh.   Ezequiel Essex, MD 03/23/15 1556

## 2015-03-23 NOTE — Consult Note (Signed)
Reason for Consult:Broken Left Hip Referring Physician: [name] is an 81 y.o. female.  HPI: 81 yo female s/p mechanical fall today after a dog knocked her down.  Patient complained of immediate left hip pain and the inability to bear weight on the left LE after the injury.  She has a history of prior vertebral compression fracture managed by my partner Dr Krystal [name]  Past Medical History  Diagnosis Date  . Back pain   . Arthritis   . Osteoporosis   . Compression fracture of lumbar vertebra 2013    Past Surgical History  Procedure Laterality Date  . Tonsillectomy      History reviewed. No pertinent family history.  Social History:  reports that she has quit smoking. She does not have any smokeless tobacco history on file. She reports that she drinks alcohol. She reports that she does not use illicit drugs.  Allergies: No Known Allergies  Medications: I have reviewed the patient's current medications.  Results for orders placed or performed during the hospital encounter of 03/23/15 (from the past 48 hour(s))  CBC with Differential/Platelet     Status: None   Collection Time: 03/23/15  1:00 PM  Result Value Ref Range   WBC 8.8 4.0 - 10.5 K/uL   RBC 4.22 3.87 - 5.11 MIL/uL   Hemoglobin 13.5 12.0 - 15.0 g/dL   HCT 39.1 36.0 - 46.0 %   MCV 92.7 78.0 - 100.0 fL   MCH 32.0 26.0 - 34.0 pg   MCHC 34.5 30.0 - 36.0 g/dL   RDW 12.9 11.5 - 15.5 %   Platelets 194 150 - 400 K/uL   Neutrophils Relative % 87 %   Neutro Abs 7.5 1.7 - 7.7 K/uL   Lymphocytes Relative 9 %   Lymphs Abs 0.8 0.7 - 4.0 K/uL   Monocytes Relative 3 %   Monocytes Absolute 0.3 0.1 - 1.0 K/uL   Eosinophils Relative 1 %   Eosinophils Absolute 0.1 0.0 - 0.7 K/uL   Basophils Relative 0 %   Basophils Absolute 0.0 0.0 - 0.1 K/uL  Basic metabolic panel     Status: Abnormal   Collection Time: 03/23/15  1:00 PM  Result Value Ref Range   Sodium 132 (L) 135 - 145 mmol/L   Potassium 3.5 3.5 - 5.1 mmol/L    Chloride 96 (L) 101 - 111 mmol/L   CO2 29 22 - 32 mmol/L   Glucose, Bld 122 (H) 65 - 99 mg/dL   BUN 18 6 - 20 mg/dL   Creatinine, Ser 0.51 0.44 - 1.00 mg/dL   Calcium 8.8 (L) 8.9 - 10.3 mg/dL   GFR calc non Af Amer >60 >60 mL/min   GFR calc Af Amer >60 >60 mL/min    Comment: (NOTE) The eGFR has been calculated using the CKD EPI equation. This calculation has not been validated in all clinical situations. eGFR's persistently <60 mL/min signify possible Chronic Kidney Disease.    Anion gap 7 5 - 15  Protime-INR     Status: None   Collection Time: 03/23/15  7:33 PM  Result Value Ref Range   Prothrombin Time 13.2 11.6 - 15.2 seconds   INR 0.98 0.00 - 1.49    Ct Hip Left Wo Contrast  03/23/2015   CLINICAL DATA:  Pt reports dog jumped up on her and pushed her down, landing on left hip. Pt c/o hip pain, no deformity or weakness.  EXAM: CT OF THE LEFT HIP WITHOUT CONTRAST  TECHNIQUE: Multidetector CT imaging of the left hip was performed according to the standard protocol. Multiplanar CT image reconstructions were also generated.  COMPARISON:  None.  FINDINGS: There is a mildly comminuted left femoral basicervical fracture with an intertrochanteric component and a fracture cleft extending into the proximal femoral diaphysis. There is no dislocation. There is no lytic or sclerotic osseous lesion. There is severe osteopenia. There is mild osteoarthritis of the left hip.  There is sequela of prior avulsive injury of the left hamstring origin.  There is fat stranding anterior to the rectus femoris muscle likely representing a small amount of hemorrhage. There is no focal fluid collection or hematoma.  IMPRESSION: 1. Mildly comminuted left femoral basicervical fracture with an intertrochanteric component and a fracture cleft extending into the proximal femoral diaphysis along the medial aspect. There is no dislocation.   Electronically Signed   By: Krystal [name]   On: 03/23/2015 14:33   Dg Hip Unilat  With Pelvis 2-3 Views Left  03/23/2015   CLINICAL DATA:  Left hip pain  EXAM: DG HIP (WITH OR WITHOUT PELVIS) 2-3V LEFT  COMPARISON:  None  FINDINGS: Mild to moderate bilateral hip osteoarthritis. There is moderate diffuse osteopenia. Suspicious lucency extending through the intertrochanteric portion of the proximal left femur is identified. No dislocation.  IMPRESSION: 1. Examination is suspicious for intertrochanteric fracture the proximal left femur. Suggests confirmatory imaging with cross-sectional exam either MRI or CT. Consider further evaluation with cross-sectional imaging. The study of choice would be a MRI.   Electronically Signed   By: Krystal Moors M.D.   On: 03/23/2015 13:03    ROS Blood pressure 123/54, pulse 78, temperature 99.2 F (37.3 C), temperature source Oral, resp. rate 16, height _0  (1.778 m), weight 49.896 kg (110 lb), SpO2 99 %. Physical Exam  Healthy appearing female in no apparent distress, neck non tender with normal AROM, T and L spine with no stepoff and no tenderness. Bilateral UEs with 5/5 motor strength and normal sensation, good push, pull, Chest nontender with normal excursion and chest rise, heart regular Right LE with pain free AROM, no pain with log roll, NVI distally Left LE, unable to assess ROM due to pain,  NVI distally   Assessment/Plan: Left intertrochanteric hip fracture.  I have discussed this case with Dr Krystal [name], orthopedic hip specialist who has plans for IM nailing tomorrow.  NPO after midnight. Place foley catheter due to broken hip and difficulty getting on and off the bedpan.  DVT prophylaxis with mechanical for now Overhead frame and trapeze  Krystal [name] 03/23/2015, 9:42 PM

## 2015-03-23 NOTE — ED Notes (Signed)
Per EMS: Pt reports dog jumped up on her and pushed her down, landing on left hip.  Pt c/o hip pain, no deformity or weakness.  Pt denies head trauma.  Pt alert and oriented.   171/68, 66p, 99%

## 2015-03-23 NOTE — H&P (Signed)
[facility] History and Physical  Krystal [name]:[id number] DOB: 09-Feb-1932 DOA: 03/23/2015  Referring physician: Wyvonnia Dusky PCP: Redge Gainer, MD   Chief Complaint: hip pain  HPI: Krystal [name] is a 81 y.o. female past medical history of osteo-porosis, arthritis chronic back pain presents emergency department with the chief complaint of left hip pain. Initial evaluation reveals intertrochanteric fracture proximal left femur. [facility] Dr. Chaney Malling with orthopedic surgery will see the patient She was in her usual state of health today when she visited a friend whose dog jumped on her and she fell. She was pushed to the ground and landed on her backside. She did not hit her head or lose consciousness. Medial he felt pain on the left hip and was unable to get up. She denies any neck or back pain. She denies chest pain palpitations shortness of breath headache. She denies any recent fever chills abdominal pain nausea vomiting. She denies dysuria hematuria frequency or urgency.  Workup in the emergency department reveals a sodium of 132 chloride 96 calcium 8.8 glucose 122. X-ray of the left hip Mildly comminuted left femoral basicervical fracture with an intertrochanteric component and a fracture cleft extending into the proximal femoral diaphysis along the medial aspect. There is no dislocation. Urgency room doctor spoke with Dr. Veverly Fells orthopedic surgeon in [place] who agreed to see the patient. She is transferred to the hospitalist service In the emergency department she's afebrile hemodynamically stable and not hypoxic.   Review of Systems:  10 point review of systems complete and all systems are negative except as noted in the history of present illness  Past Medical History  Diagnosis Date  . Back pain   . Arthritis   . Osteoporosis   . Compression fracture of lumbar vertebra 2013   Past Surgical History  Procedure Laterality Date  .  Tonsillectomy     Social History:  reports that she has quit smoking. She does not have any smokeless tobacco history on file. She reports that she drinks alcohol. She reports that she does not use illicit drugs. Lives at home alone she is independent with ADLs she is legally blind she stopped smoking 30 years ago she is a retired Engineer, technical sales with [employer] No Known Allergies  History reviewed. No pertinent family history. family medical history reviewed noncontributory to the admission of this elderly lady  Prior to Admission medications   Medication Sig Start Date End Date Taking? Authorizing Provider  acetaminophen (TYLENOL) 500 MG tablet Take 1,000 mg by mouth every 6 (six) hours as needed. For pain.   Yes Historical Provider, MD  AMINO ACIDS PO Take 1 tablet by mouth daily.   Yes Historical Provider, MD  cholecalciferol (VITAMIN D) 1000 UNITS tablet Take 2,000 Units by mouth daily.   Yes Historical Provider, MD  dorzolamide-timolol (COSOPT) 22.3-6.8 MG/ML ophthalmic solution Place 1 drop into the right eye 2 (two) times daily.   Yes Historical Provider, MD  Multiple Vitamin (MULITIVITAMIN WITH MINERALS) TABS Take 1 tablet by mouth daily.   Yes Historical Provider, MD  Multiple Vitamins-Minerals (PRESERVISION AREDS PO) Take 1 capsule by mouth daily.   Yes Historical Provider, MD  omega-3 acid ethyl esters (LOVAZA) 1 G capsule Take 1 g by mouth daily.   Yes Historical Provider, MD  TRAVATAN Z 0.004 % SOLN ophthalmic solution Place 1 drop into the right eye at bedtime.  01/08/15  Yes Historical Provider, MD   Physical Exam: Filed Vitals:  03/23/15 1330 03/23/15 1400 03/23/15 1430 03/23/15 1500  BP: 147/72 139/58 135/62 132/61  Pulse:  71 70 73  Temp:      TempSrc:      Resp:  14 12 11   Height:      Weight:      SpO2:  100% 98% 100%    Wt Readings from Last 3 Encounters:  03/23/15 49.896 kg (110 lb)    General:  Appears calm and comfortable Eyes: PERRL, normal lids, right  eye slightly bloodshot somewhat smaller ENT: grossly normal hearing, l mucous membranes of her mouth are pink but dry Neck: no LAD, masses or thyromegaly Cardiovascular: RRR, no m/r/g. No LE edema.  Respiratory: CTA bilaterally, no w/r/r. Normal respiratory effort. Abdomen: soft, ntnd positive bowel sounds Skin: no rash or induration seen on limited exam Musculoskeletal: grossly normal tone BUE/BLE. Left leg internally rotated slightly shorter Psychiatric: grossly normal mood and affect, speech fluent and appropriate Neurologic: grossly non-focal. Speech clear facial symmetry           Labs on Admission:  Basic Metabolic Panel:  Recent Labs Lab 03/23/15 1300  NA 132*  K 3.5  CL 96*  CO2 29  GLUCOSE 122*  BUN 18  CREATININE 0.51  CALCIUM 8.8*   Liver Function Tests: No results for input(s): AST, ALT, ALKPHOS, BILITOT, PROT, ALBUMIN in the last 168 hours. No results for input(s): LIPASE, AMYLASE in the last 168 hours. No results for input(s): AMMONIA in the last 168 hours. CBC:  Recent Labs Lab 03/23/15 1300  WBC 8.8  NEUTROABS 7.5  HGB 13.5  HCT 39.1  MCV 92.7  PLT 194   Cardiac Enzymes: No results for input(s): CKTOTAL, CKMB, CKMBINDEX, TROPONINI in the last 168 hours.  BNP (last 3 results) No results for input(s): BNP in the last 8760 hours.  ProBNP (last 3 results) No results for input(s): PROBNP in the last 8760 hours.  CBG: No results for input(s): GLUCAP in the last 168 hours.  Radiological Exams on Admission: Ct Hip Left Wo Contrast  03/23/2015   CLINICAL DATA:  Pt reports dog jumped up on her and pushed her down, landing on left hip. Pt c/o hip pain, no deformity or weakness.  EXAM: CT OF THE LEFT HIP WITHOUT CONTRAST  TECHNIQUE: Multidetector CT imaging of the left hip was performed according to the standard protocol. Multiplanar CT image reconstructions were also generated.  COMPARISON:  None.  FINDINGS: There is a mildly comminuted left femoral  basicervical fracture with an intertrochanteric component and a fracture cleft extending into the proximal femoral diaphysis. There is no dislocation. There is no lytic or sclerotic osseous lesion. There is severe osteopenia. There is mild osteoarthritis of the left hip.  There is sequela of prior avulsive injury of the left hamstring origin.  There is fat stranding anterior to the rectus femoris muscle likely representing a small amount of hemorrhage. There is no focal fluid collection or hematoma.  IMPRESSION: 1. Mildly comminuted left femoral basicervical fracture with an intertrochanteric component and a fracture cleft extending into the proximal femoral diaphysis along the medial aspect. There is no dislocation.   Electronically Signed   By: Kathreen Devoid   On: 03/23/2015 14:33   Dg Hip Unilat With Pelvis 2-3 Views Left  03/23/2015   CLINICAL DATA:  Left hip pain  EXAM: DG HIP (WITH OR WITHOUT PELVIS) 2-3V LEFT  COMPARISON:  None  FINDINGS: Mild to moderate bilateral hip osteoarthritis. There is moderate diffuse osteopenia.  Suspicious lucency extending through the intertrochanteric portion of the proximal left femur is identified. No dislocation.  IMPRESSION: 1. Examination is suspicious for intertrochanteric fracture the proximal left femur. Suggests confirmatory imaging with cross-sectional exam either MRI or CT. Consider further evaluation with cross-sectional imaging. The study of choice would be a MRI.   Electronically Signed   By: Kerby Moors M.D.   On: 03/23/2015 13:03    EKG: Independently reviewed. Sinus rhythm  Assessment/Plan Principal Problem:   Intertrochanteric fracture of left hip: As a result of mechanical fall. Per x-ray. Patient with a history of osteoporosis compression fracture. Will admit to [facility]. She will be seen by Dr.[name] for likely surgical repair. Per ED physician will keep nothing by mouth as orthopedic surgeon may be able to repair tonight. Gentle IV fluids. Pain  control.  Active Problems:   Hyponatremia: Mild. Likely related to decreased oral intake. Gentle IV fluids. Recheck in the morning    Hyperglycemia: Mild no history of diabetes. Will obtain a hemoglobin A1c likely reactive     Dr Veverly Fells orthopedic surgery   Code Status: full DVT Prophylaxis: Family Communication: friend at bedside Disposition Plan: will likely need short term snf  Time spent: 68 minutes [facility] Pager [phone]

## 2015-03-23 NOTE — ED Notes (Signed)
Pt undressed heat to toe, belongings placed in pt bag.

## 2015-03-24 ENCOUNTER — Inpatient Hospital Stay (HOSPITAL_COMMUNITY): Payer: Medicare Other | Admitting: Anesthesiology

## 2015-03-24 ENCOUNTER — Inpatient Hospital Stay (HOSPITAL_COMMUNITY): Payer: Medicare Other

## 2015-03-24 ENCOUNTER — Encounter (HOSPITAL_COMMUNITY)
Admission: EM | Disposition: A | Discharge status: Skilled Nursing Facility | Payer: Self-pay | Source: Home / Self Care | Attending: Family Medicine

## 2015-03-24 ENCOUNTER — Encounter (HOSPITAL_COMMUNITY): Payer: Self-pay | Admitting: Anesthesiology

## 2015-03-24 DIAGNOSIS — R739 Hyperglycemia, unspecified: Secondary | ICD-10-CM

## 2015-03-24 DIAGNOSIS — E871 Hypo-osmolality and hyponatremia: Secondary | ICD-10-CM

## 2015-03-24 DIAGNOSIS — S72142S Displaced intertrochanteric fracture of left femur, sequela: Secondary | ICD-10-CM

## 2015-03-24 DIAGNOSIS — S72102A Unspecified trochanteric fracture of left femur, initial encounter for closed fracture: Secondary | ICD-10-CM | POA: Diagnosis present

## 2015-03-24 HISTORY — PX: INTRAMEDULLARY (IM) NAIL INTERTROCHANTERIC: SHX5875

## 2015-03-24 LAB — URINALYSIS, ROUTINE W REFLEX MICROSCOPIC
BILIRUBIN URINE: NEGATIVE
GLUCOSE, UA: NEGATIVE mg/dL
HGB URINE DIPSTICK: NEGATIVE
KETONES UR: 40 mg/dL — AB
Leukocytes, UA: NEGATIVE
Nitrite: NEGATIVE
PH: 6.5 (ref 5.0–8.0)
Protein, ur: NEGATIVE mg/dL
Specific Gravity, Urine: 1.022 (ref 1.005–1.030)
Urobilinogen, UA: 0.2 mg/dL (ref 0.0–1.0)

## 2015-03-24 LAB — CBC
HCT: 37 % (ref 36.0–46.0)
HEMOGLOBIN: 12.6 g/dL (ref 12.0–15.0)
MCH: 31.7 pg (ref 26.0–34.0)
MCHC: 34.1 g/dL (ref 30.0–36.0)
MCV: 93.2 fL (ref 78.0–100.0)
PLATELETS: 159 10*3/uL (ref 150–400)
RBC: 3.97 MIL/uL (ref 3.87–5.11)
RDW: 13 % (ref 11.5–15.5)
WBC: 7.2 10*3/uL (ref 4.0–10.5)

## 2015-03-24 LAB — HEMOGLOBIN A1C
HEMOGLOBIN A1C: 6.1 % — AB (ref 4.8–5.6)
MEAN PLASMA GLUCOSE: 128 mg/dL

## 2015-03-24 LAB — BASIC METABOLIC PANEL
Anion gap: 8 (ref 5–15)
BUN: 19 mg/dL (ref 6–20)
CALCIUM: 9.3 mg/dL (ref 8.9–10.3)
CO2: 30 mmol/L (ref 22–32)
CREATININE: 0.61 mg/dL (ref 0.44–1.00)
Chloride: 97 mmol/L — ABNORMAL LOW (ref 101–111)
GFR calc Af Amer: >60 mL/min (ref >60)
GFR calc non Af Amer: >60 mL/min (ref >60)
GLUCOSE: 142 mg/dL — AB (ref 65–99)
Potassium: 4 mmol/L (ref 3.5–5.1)
Sodium: 135 mmol/L (ref 135–145)

## 2015-03-24 LAB — SURGICAL PCR SCREEN
MRSA, PCR: NEGATIVE
Staphylococcus aureus: NEGATIVE

## 2015-03-24 LAB — GLUCOSE, CAPILLARY: Glucose-Capillary: 89 mg/dL (ref 65–99)

## 2015-03-24 SURGERY — FIXATION, FRACTURE, INTERTROCHANTERIC, WITH INTRAMEDULLARY ROD
Anesthesia: General | Laterality: Left

## 2015-03-24 MED ORDER — HYDROCODONE-ACETAMINOPHEN 5-325 MG PO TABS
1.0000 | ORAL_TABLET | Freq: Four times a day (QID) | ORAL | Status: DC | PRN
  Administered 2015-03-25 – 2015-03-26 (×3): 1 via ORAL
  Filled 2015-03-24 (×3): qty 1

## 2015-03-24 MED ORDER — OXYCODONE HCL 5 MG/5ML PO SOLN
5.0000 mg | Freq: Once | ORAL | Status: DC | PRN
Start: 2015-03-24 — End: 2015-03-24

## 2015-03-24 MED ORDER — NEOSTIGMINE METHYLSULFATE 10 MG/10ML IV SOLN
INTRAVENOUS | Status: DC | PRN
  Administered 2015-03-24: 2.5 mg via INTRAVENOUS

## 2015-03-24 MED ORDER — GLYCOPYRROLATE 0.2 MG/ML IJ SOLN
INTRAMUSCULAR | Status: DC | PRN
  Administered 2015-03-24: 0.3 mg via INTRAVENOUS

## 2015-03-24 MED ORDER — MENTHOL 3 MG MT LOZG: 1.0000 | LOZENGE | OROMUCOSAL | Status: DC | PRN

## 2015-03-24 MED ORDER — LIDOCAINE HCL (CARDIAC) 20 MG/ML IV SOLN
INTRAVENOUS | Status: DC | PRN
  Administered 2015-03-24: 60 mg via INTRAVENOUS

## 2015-03-24 MED ORDER — INFLUENZA VAC SPLIT QUAD 0.5 ML IM SUSY
0.5000 mL | PREFILLED_SYRINGE | INTRAMUSCULAR | Status: AC
  Administered 2015-03-26: 0.5 mL via INTRAMUSCULAR
  Filled 2015-03-24 (×2): qty 0.5

## 2015-03-24 MED ORDER — LACTATED RINGERS IV SOLN: INTRAVENOUS | Status: DC

## 2015-03-24 MED ORDER — ENOXAPARIN SODIUM 30 MG/0.3ML ~~LOC~~ SOLN
30.0000 mg | SUBCUTANEOUS | Status: DC
  Administered 2015-03-25 – 2015-03-26 (×2): 30 mg via SUBCUTANEOUS
  Filled 2015-03-24 (×2): qty 0.3

## 2015-03-24 MED ORDER — FENTANYL CITRATE (PF) 100 MCG/2ML IJ SOLN: 25.0000 ug | INTRAMUSCULAR | Status: DC | PRN

## 2015-03-24 MED ORDER — PROPOFOL 10 MG/ML IV BOLUS
INTRAVENOUS | Status: DC | PRN
  Administered 2015-03-24: 100 mg via INTRAVENOUS

## 2015-03-24 MED ORDER — OXYCODONE HCL 5 MG PO TABS: 5.0000 mg | ORAL_TABLET | Freq: Once | ORAL | Status: DC | PRN

## 2015-03-24 MED ORDER — FENTANYL CITRATE (PF) 250 MCG/5ML IJ SOLN
INTRAMUSCULAR | Status: AC
  Filled 2015-03-24: qty 5

## 2015-03-24 MED ORDER — ADULT MULTIVITAMIN W/MINERALS CH
1.0000 | ORAL_TABLET | Freq: Every day | ORAL | Status: DC
  Administered 2015-03-25 – 2015-03-26 (×2): 1 via ORAL
  Filled 2015-03-24: qty 1

## 2015-03-24 MED ORDER — 0.9 % SODIUM CHLORIDE (POUR BTL) OPTIME
TOPICAL | Status: DC | PRN
  Administered 2015-03-24: 1000 mL

## 2015-03-24 MED ORDER — CEFAZOLIN SODIUM-DEXTROSE 2-3 GM-% IV SOLR
2.0000 g | Freq: Four times a day (QID) | INTRAVENOUS | Status: AC
  Administered 2015-03-24 – 2015-03-25 (×2): 2 g via INTRAVENOUS
  Filled 2015-03-24 (×2): qty 50

## 2015-03-24 MED ORDER — FENTANYL CITRATE (PF) 100 MCG/2ML IJ SOLN
INTRAMUSCULAR | Status: DC | PRN
  Administered 2015-03-24 (×2): 50 ug via INTRAVENOUS

## 2015-03-24 MED ORDER — ROCURONIUM BROMIDE 100 MG/10ML IV SOLN
INTRAVENOUS | Status: DC | PRN
  Administered 2015-03-24: 30 mg via INTRAVENOUS

## 2015-03-24 MED ORDER — PHENYLEPHRINE HCL 10 MG/ML IJ SOLN
INTRAMUSCULAR | Status: DC | PRN
  Administered 2015-03-24 (×2): 80 ug via INTRAVENOUS

## 2015-03-24 MED ORDER — ONDANSETRON HCL 4 MG/2ML IJ SOLN
4.0000 mg | Freq: Once | INTRAMUSCULAR | Status: DC | PRN
Start: 2015-03-24 — End: 2015-03-24

## 2015-03-24 MED ORDER — EPHEDRINE SULFATE 50 MG/ML IJ SOLN
INTRAMUSCULAR | Status: DC | PRN
  Administered 2015-03-24 (×2): 10 mg via INTRAVENOUS

## 2015-03-24 MED ORDER — LACTATED RINGERS IV SOLN
INTRAVENOUS | Status: DC | PRN
  Administered 2015-03-24 (×2): via INTRAVENOUS

## 2015-03-24 MED ORDER — DEXAMETHASONE SODIUM PHOSPHATE 4 MG/ML IJ SOLN
INTRAMUSCULAR | Status: DC | PRN
  Administered 2015-03-24: 4 mg via INTRAVENOUS

## 2015-03-24 MED ORDER — CEFAZOLIN SODIUM-DEXTROSE 2-3 GM-% IV SOLR
INTRAVENOUS | Status: AC
  Filled 2015-03-24: qty 50

## 2015-03-24 MED ORDER — ONDANSETRON HCL 4 MG/2ML IJ SOLN
INTRAMUSCULAR | Status: DC | PRN
  Administered 2015-03-24: 4 mg via INTRAVENOUS

## 2015-03-24 MED ORDER — METOCLOPRAMIDE HCL 5 MG/ML IJ SOLN: 5.0000 mg | Freq: Three times a day (TID) | INTRAMUSCULAR | Status: DC | PRN

## 2015-03-24 MED ORDER — PHENYLEPHRINE 40 MCG/ML (10ML) SYRINGE FOR IV PUSH (FOR BLOOD PRESSURE SUPPORT)
PREFILLED_SYRINGE | INTRAVENOUS | Status: AC
  Filled 2015-03-24: qty 30

## 2015-03-24 MED ORDER — ONDANSETRON HCL 4 MG/2ML IJ SOLN
INTRAMUSCULAR | Status: AC
  Filled 2015-03-24: qty 4

## 2015-03-24 MED ORDER — PHENOL 1.4 % MT LIQD: 1.0000 | OROMUCOSAL | Status: DC | PRN

## 2015-03-24 MED ORDER — DEXAMETHASONE SODIUM PHOSPHATE 4 MG/ML IJ SOLN
INTRAMUSCULAR | Status: AC
  Filled 2015-03-24: qty 1

## 2015-03-24 MED ORDER — METOCLOPRAMIDE HCL 5 MG PO TABS: 5.0000 mg | ORAL_TABLET | Freq: Three times a day (TID) | ORAL | Status: DC | PRN

## 2015-03-24 SURGICAL SUPPLY — 57 items
ADH SKN CLS APL DERMABOND .7 (GAUZE/BANDAGES/DRESSINGS)
ALCOHOL ISOPROPYL (RUBBING) (MISCELLANEOUS) ×3 IMPLANT
BIT DRILL 4.2 (DRILL) IMPLANT
BLADE HELICAL TFNA 90 HIP (Anchor) ×1 IMPLANT
BLADE HELICAL TFNA 90MM HIP (Anchor) ×1 IMPLANT
BNDG COHESIVE 4X5 TAN STRL (GAUZE/BANDAGES/DRESSINGS) IMPLANT
CHLORAPREP W/TINT 26ML (MISCELLANEOUS) ×3 IMPLANT
COVER PERINEAL POST (MISCELLANEOUS) ×3 IMPLANT
COVER SURGICAL LIGHT HANDLE (MISCELLANEOUS) ×5 IMPLANT
DERMABOND ADVANCED (GAUZE/BANDAGES/DRESSINGS)
DERMABOND ADVANCED .7 DNX12 (GAUZE/BANDAGES/DRESSINGS) ×1 IMPLANT
DRAPE C-ARM 42X72 X-RAY (DRAPES) ×3 IMPLANT
DRAPE C-ARMOR (DRAPES) ×3 IMPLANT
DRAPE IMP U-DRAPE 54X76 (DRAPES) ×6 IMPLANT
DRAPE STERI IOBAN 125X83 (DRAPES) ×3 IMPLANT
DRAPE TABLE COVER HEAVY DUTY (DRAPES) ×2 IMPLANT
DRAPE U-SHAPE 47X51 STRL (DRAPES) ×6 IMPLANT
DRAPE UNIVERSAL PACK (DRAPES) ×3 IMPLANT
DRILL 4.2 (DRILL) ×3
DRSG MEPILEX BORDER 4X4 (GAUZE/BANDAGES/DRESSINGS) ×8 IMPLANT
DRSG PAD ABDOMINAL 8X10 ST (GAUZE/BANDAGES/DRESSINGS) IMPLANT
ELECT REM PT RETURN 9FT ADLT (ELECTROSURGICAL) ×3
ELECTRODE REM PT RTRN 9FT ADLT (ELECTROSURGICAL) ×1 IMPLANT
FACESHIELD WRAPAROUND (MASK) ×3 IMPLANT
FACESHIELD WRAPAROUND OR TEAM (MASK) ×1 IMPLANT
GLOVE BIOGEL PI IND STRL 7.5 (GLOVE) IMPLANT
GLOVE BIOGEL PI IND STRL 8.5 (GLOVE) ×2 IMPLANT
GLOVE BIOGEL PI INDICATOR 7.5 (GLOVE) ×2
GLOVE BIOGEL PI INDICATOR 8.5 (GLOVE) ×4
GLOVE ECLIPSE 6.5 STRL STRAW (GLOVE) ×2 IMPLANT
GLOVE SURG ORTHO 8.5 STRL (GLOVE) ×3 IMPLANT
GOWN STRL REUS W/ TWL LRG LVL3 (GOWN DISPOSABLE) ×2 IMPLANT
GOWN STRL REUS W/TWL 2XL LVL3 (GOWN DISPOSABLE) ×3 IMPLANT
GOWN STRL REUS W/TWL LRG LVL3 (GOWN DISPOSABLE) ×6
GUIDEWIRE 3.2X400 (WIRE) ×2 IMPLANT
KIT ROOM TURNOVER OR (KITS) ×3 IMPLANT
LIQUID BAND (GAUZE/BANDAGES/DRESSINGS) ×4 IMPLANT
MANIFOLD NEPTUNE II (INSTRUMENTS) ×3 IMPLANT
MARKER SKIN DUAL TIP RULER LAB (MISCELLANEOUS) ×2 IMPLANT
NAIL TROCH FIX LNG 11X420LT (Nail) ×2 IMPLANT
NS IRRIG 1000ML POUR BTL (IV SOLUTION) ×3 IMPLANT
PACK GENERAL/GYN (CUSTOM PROCEDURE TRAY) ×3 IMPLANT
PAD ARMBOARD 7.5X6 YLW CONV (MISCELLANEOUS) ×6 IMPLANT
PADDING CAST ABS 4INX4YD NS (CAST SUPPLIES)
PADDING CAST ABS COTTON 4X4 ST (CAST SUPPLIES) IMPLANT
REAMER ROD DEEP FLUTE 2.5X950 (INSTRUMENTS) ×2 IMPLANT
SCREW LOCKING 5.0MMX40MM (Screw) ×2 IMPLANT
SUT MNCRL AB 3-0 PS2 18 (SUTURE) ×2 IMPLANT
SUT MNCRL AB 3-0 PS2 27 (SUTURE) ×1 IMPLANT
SUT MON AB 2-0 CT1 27 (SUTURE) ×1 IMPLANT
SUT MON AB 2-0 CT1 36 (SUTURE) ×2 IMPLANT
SUT VIC AB 1 CT1 27 (SUTURE)
SUT VIC AB 1 CT1 27XBRD ANBCTR (SUTURE) ×1 IMPLANT
SUT VIC AB 1 CTX 36 (SUTURE) ×6
SUT VIC AB 1 CTX36XBRD ANBCTR (SUTURE) IMPLANT
TOWEL OR 17X24 6PK STRL BLUE (TOWEL DISPOSABLE) ×3 IMPLANT
TOWEL OR 17X26 10 PK STRL BLUE (TOWEL DISPOSABLE) ×3 IMPLANT

## 2015-03-24 NOTE — Transfer of Care (Signed)
Immediate Anesthesia Transfer of Care Note  Patient: Krystal [name]  Procedure(s) Performed: Procedure(s): INTRAMEDULLARY (IM) NAIL INTERTROCHANTRIC (Left)  Patient Location: PACU  Anesthesia Type:General  Level of Consciousness: awake, oriented, sedated, patient cooperative and responds to stimulation  Airway & Oxygen Therapy: Patient Spontanous Breathing and Patient connected to nasal cannula oxygen  Post-op Assessment: Report given to RN, Post -op Vital signs reviewed and stable, Patient moving all extremities and Patient moving all extremities X 4  Post vital signs: Reviewed and stable  Last Vitals:  Filed Vitals:   03/24/15 1505  BP: 141/51  Pulse: 69  Temp: 37.2 C  Resp: 16    Complications: No apparent anesthesia complications

## 2015-03-24 NOTE — Anesthesia Preprocedure Evaluation (Signed)
Anesthesia Evaluation  Patient identified by MRN, date of birth, ID band Patient awake    Reviewed: Allergy & Precautions, NPO status , Patient's Chart, lab work & pertinent test results  Airway Mallampati: II   Neck ROM: full    Dental   Pulmonary former smoker,    breath sounds clear to auscultation       Cardiovascular negative cardio ROS   Rhythm:regular Rate:Normal     Neuro/Psych    GI/Hepatic   Endo/Other    Renal/GU      Musculoskeletal  (+) Arthritis ,   Abdominal   Peds  Hematology   Anesthesia Other Findings   Reproductive/Obstetrics                             Anesthesia Physical Anesthesia Plan  ASA: III  Anesthesia Plan: General   Post-op Pain Management:    Induction: Intravenous  Airway Management Planned: Oral ETT  Additional Equipment:   Intra-op Plan:   Post-operative Plan: Extubation in OR  Informed Consent: I have reviewed the patients History and Physical, chart, labs and discussed the procedure including the risks, benefits and alternatives for the proposed anesthesia with the patient or authorized representative who has indicated his/her understanding and acceptance.     Plan Discussed with: CRNA, Anesthesiologist and Surgeon  Anesthesia Plan Comments:         Anesthesia Quick Evaluation

## 2015-03-24 NOTE — Anesthesia Procedure Notes (Signed)
Procedure Name: Intubation Date/Time: 03/24/2015 5:07 PM Performed by: Jacquiline Doe A Pre-anesthesia Checklist: Patient identified, Emergency Drugs available, Suction available, Patient being monitored and Timeout performed Patient Re-evaluated:Patient Re-evaluated prior to inductionOxygen Delivery Method: Circle system utilized Preoxygenation: Pre-oxygenation with 100% oxygen Intubation Type: IV induction and Cricoid Pressure applied Ventilation: Mask ventilation without difficulty Laryngoscope Size: Mac and 3 Grade View: Grade II Tube type: Oral Tube size: 7.5 mm Number of attempts: 1 Airway Equipment and Method: Stylet Placement Confirmation: ETT inserted through vocal cords under direct vision,  positive ETCO2 and breath sounds checked- equal and bilateral Secured at: 22 cm Tube secured with: Tape Dental Injury: Teeth and Oropharynx as per pre-operative assessment

## 2015-03-24 NOTE — Brief Op Note (Signed)
03/23/2015 - 03/24/2015  6:33 PM  PATIENT:  Krystal [name]  81 y.o. female  PRE-OPERATIVE DIAGNOSIS:  LEFT FEMUR FRACTURE  POST-OPERATIVE DIAGNOSIS:  LEFT FEMUR FRACTURE  PROCEDURE:  Procedure(s): INTRAMEDULLARY (IM) NAIL INTERTROCHANTRIC (Left)  SURGEON:  Surgeon(s) and Role:    * Rod Can, MD - Primary  PHYSICIAN ASSISTANT: none  ASSISTANTS: staff   ANESTHESIA:   general  EBL:  Total I/O In: 2219.6 [I.V.:2219.6] Out: 800 [Urine:650; Blood:150]  BLOOD ADMINISTERED:none  DRAINS: none   LOCAL MEDICATIONS USED:  NONE  SPECIMEN:  No Specimen  DISPOSITION OF SPECIMEN:  N/A  COUNTS:  YES  TOURNIQUET:  * No tourniquets in log *  DICTATION: .Other Dictation: Dictation Number [id number]  PLAN OF CARE: Admit to inpatient   PATIENT DISPOSITION:  PACU - hemodynamically stable.   Delay start of Pharmacological VTE agent (>24hrs) due to surgical blood loss or risk of bleeding: no

## 2015-03-24 NOTE — Progress Notes (Signed)
Asked to assume care by Dr. Veverly Fells. See his H&P for complete details. H/o osteoporosis. She has a left high IT / low basicervical femoral neck fx with GT involvement. Recommend IM nail fixation. If she fails to heal, conversion to arthroplasty would be the plan. She understands the risks, benefits, and alternatives. Plan for OR today. Cont NPO. Hold anticoagulants.

## 2015-03-24 NOTE — Progress Notes (Signed)
[facility] Progress Note   Krystal [name]  BSW:[id number]  DOB: [birth year]  DOA: 03/23/2015 PCP: Redge Gainer, MD  Brief narrative: Krystal [name] is a 81 y.o. female with osteoporosis and arthritis presented to the hospital with left hip pain after a fall and is found to have a left intertrochanteric fracture of the femur.   Subjective: No pain in left hip unless she moves her leg. No complaints of nausea vomiting abdominal pain diarrhea cough or shortness of breath.  Assessment/Plan: Principal Problem:   Intertrochanteric fracture of left hip -Management per orthopedic surgery  Active Problems:   Hyponatremia -Improved with IV fluids-she states that she has not eaten much in the past 3 days-continue IV fluids which have been ordered to be discontinued once she is eating again after her procedure today    Hyperglycemia/ diabetes mellitus -A1c 6.1- have recommended dietary changes- nutrition consult requested   Code Status:     Code Status Orders        Start     Ordered   03/23/15 1829  Full code   Continuous     03/23/15 1828    Advance Directive Documentation        Most Recent Value   Type of Advance Directive  Healthcare Power of [place], Living will [employer] ]   Pre-existing out of facility DNR order (yellow form or pink MOST form)     "MOST" Form in Place?       Disposition Plan: Home when stable DVT prophylaxis: Per orthopedic surgery Consultants: Orthopedic surgery Procedures:  Antibiotics: Anti-infectives    Start     Dose/Rate Route Frequency Ordered Stop   03/24/15 0430  ceFAZolin (ANCEF) IVPB 2 g/50 mL premix     2 g 100 mL/hr over 30 Minutes Intravenous To [facility] 03/23/15 2215 03/25/15 0430      Objective: Filed Weights   03/23/15 1133  Weight: 49.896 kg (110 lb)    Intake/Output Summary (Last 24 hours) at 03/24/15 1259 Last data filed at 03/24/15 1200  Gross per 24 hour  Intake 420.83 ml  Output    450 ml  Net  -29.17 ml     Vitals Filed Vitals:   03/23/15 1600 03/23/15 1653 03/23/15 2022 03/24/15 0506  BP: 147/69 137/60 123/54 116/57  Pulse: 89 72 78 66  Temp:  99 F (37.2 C) 99.2 F (37.3 C) 98.1 F (36.7 C)  TempSrc:   Oral Oral  Resp: 14 15 16 15   Height:      Weight:      SpO2: 96% 97% 99% 100%    Exam:  General:  Pt is alert, not in acute distress  HEENT: No icterus, No thrush, oral mucosa moist  Cardiovascular: regular rate and rhythm, S1/S2 No murmur  Respiratory: clear to auscultation bilaterally   Abdomen: Soft, +Bowel sounds, non tender, non distended, no guarding  MSK: No LE edema, cyanosis or clubbing  Data Reviewed: Basic Metabolic Panel:  Recent Labs Lab 03/23/15 1300 03/24/15 0454  NA 132* 135  K 3.5 4.0  CL 96* 97*  CO2 29 30  GLUCOSE 122* 142*  BUN 18 19  CREATININE 0.51 0.61  CALCIUM 8.8* 9.3   Liver Function Tests: No results for input(s): AST, ALT, ALKPHOS, BILITOT, PROT, ALBUMIN in the last 168 hours. No results for input(s): LIPASE, AMYLASE in the last 168 hours. No results for input(s): AMMONIA in the last 168 hours. CBC:  Recent Labs Lab 03/23/15 1300 03/24/15 0454  WBC 8.8 7.2  NEUTROABS 7.5  --   HGB 13.5 12.6  HCT 39.1 37.0  MCV 92.7 93.2  PLT 194 159   Cardiac Enzymes: No results for input(s): CKTOTAL, CKMB, CKMBINDEX, TROPONINI in the last 168 hours. BNP (last 3 results) No results for input(s): BNP in the last 8760 hours.  ProBNP (last 3 results) No results for input(s): PROBNP in the last 8760 hours.  CBG: No results for input(s): GLUCAP in the last 168 hours.  Recent Results (from the past 240 hour(s))  Surgical pcr screen     Status: None   Collection Time: 03/24/15  6:40 AM  Result Value Ref Range Status   MRSA, PCR NEGATIVE NEGATIVE Final   Staphylococcus aureus NEGATIVE NEGATIVE Final    Comment:        The Xpert SA Assay (FDA approved for NASAL specimens in patients over 8 years of age), is one  component of a comprehensive surveillance program.  Test performance has been validated by [facility] for patients greater than or equal to 8 year old. It is not intended to diagnose infection nor to guide or monitor treatment.      Studies: Ct Hip Left Wo Contrast  03/23/2015   CLINICAL DATA:  Pt reports dog jumped up on her and pushed her down, landing on left hip. Pt c/o hip pain, no deformity or weakness.  EXAM: CT OF THE LEFT HIP WITHOUT CONTRAST  TECHNIQUE: Multidetector CT imaging of the left hip was performed according to the standard protocol. Multiplanar CT image reconstructions were also generated.  COMPARISON:  None.  FINDINGS: There is a mildly comminuted left femoral basicervical fracture with an intertrochanteric component and a fracture cleft extending into the proximal femoral diaphysis. There is no dislocation. There is no lytic or sclerotic osseous lesion. There is severe osteopenia. There is mild osteoarthritis of the left hip.  There is sequela of prior avulsive injury of the left hamstring origin.  There is fat stranding anterior to the rectus femoris muscle likely representing a small amount of hemorrhage. There is no focal fluid collection or hematoma.  IMPRESSION: 1. Mildly comminuted left femoral basicervical fracture with an intertrochanteric component and a fracture cleft extending into the proximal femoral diaphysis along the medial aspect. There is no dislocation.   Electronically Signed   By: Kathreen Devoid   On: 03/23/2015 14:33   Dg Hip Unilat With Pelvis 2-3 Views Left  03/23/2015   CLINICAL DATA:  Left hip pain  EXAM: DG HIP (WITH OR WITHOUT PELVIS) 2-3V LEFT  COMPARISON:  None  FINDINGS: Mild to moderate bilateral hip osteoarthritis. There is moderate diffuse osteopenia. Suspicious lucency extending through the intertrochanteric portion of the proximal left femur is identified. No dislocation.  IMPRESSION: 1. Examination is suspicious for intertrochanteric fracture  the proximal left femur. Suggests confirmatory imaging with cross-sectional exam either MRI or CT. Consider further evaluation with cross-sectional imaging. The study of choice would be a MRI.   Electronically Signed   By: Kerby Moors M.D.   On: 03/23/2015 13:03    Scheduled Meds:  Scheduled Meds: .  ceFAZolin (ANCEF) IV  2 g Intravenous To [facility]-Surg  . chlorhexidine  60 mL Topical Once  . docusate sodium  100 mg Oral BID  . dorzolamide-timolol  1 drop Right Eye BID  . [START ON 03/25/2015] Influenza vac split quadrivalent PF  0.5 mL Intramuscular Tomorrow-1000  . latanoprost  1 drop Both Eyes QHS  . [START ON 03/25/2015] multivitamin  with minerals  1 tablet Oral Daily   Continuous Infusions: . sodium chloride 125 mL/hr at 03/24/15 1041    Time spent on care of this patient: 35 min   Causey, MD 03/24/2015, 12:59 PM  LOS: 1 day   [facility] Office  [phone] Pager - Text Page per [email or web address] If 7PM-7AM, please contact night-coverage [email or web address]

## 2015-03-24 NOTE — Progress Notes (Signed)
Utilization Review Completed.Dowell, Deborah T9/27/2016  

## 2015-03-25 ENCOUNTER — Encounter (HOSPITAL_COMMUNITY): Payer: Self-pay | Admitting: Orthopedic Surgery

## 2015-03-25 DIAGNOSIS — S72142C Displaced intertrochanteric fracture of left femur, initial encounter for open fracture type IIIA, IIIB, or IIIC: Secondary | ICD-10-CM

## 2015-03-25 DIAGNOSIS — S72001A Fracture of unspecified part of neck of right femur, initial encounter for closed fracture: Secondary | ICD-10-CM

## 2015-03-25 LAB — CBC
HEMATOCRIT: 31 % — AB (ref 36.0–46.0)
Hemoglobin: 10.2 g/dL — ABNORMAL LOW (ref 12.0–15.0)
MCH: 30.7 pg (ref 26.0–34.0)
MCHC: 32.9 g/dL (ref 30.0–36.0)
MCV: 93.4 fL (ref 78.0–100.0)
Platelets: 136 10*3/uL — ABNORMAL LOW (ref 150–400)
RBC: 3.32 MIL/uL — AB (ref 3.87–5.11)
RDW: 13.2 % (ref 11.5–15.5)
WBC: 6.5 10*3/uL (ref 4.0–10.5)

## 2015-03-25 LAB — BASIC METABOLIC PANEL
ANION GAP: 7 (ref 5–15)
BUN: 16 mg/dL (ref 6–20)
CO2: 25 mmol/L (ref 22–32)
Calcium: 8.1 mg/dL — ABNORMAL LOW (ref 8.9–10.3)
Chloride: 100 mmol/L — ABNORMAL LOW (ref 101–111)
Creatinine, Ser: 0.48 mg/dL (ref 0.44–1.00)
GFR calc Af Amer: >60 mL/min (ref >60)
GFR calc non Af Amer: >60 mL/min (ref >60)
GLUCOSE: 172 mg/dL — AB (ref 65–99)
POTASSIUM: 3.4 mmol/L — AB (ref 3.5–5.1)
Sodium: 132 mmol/L — ABNORMAL LOW (ref 135–145)

## 2015-03-25 NOTE — Clinical Social Work Note (Signed)
Clinical Social Work Assessment  Patient Details  Name: Krystal [name] MRN: [id number] Date of Birth: 1931/08/08  Date of referral:  03/25/15               Reason for consult:  Facility Placement                Permission sought to share information with:  Chartered certified accountant granted to share information::  Yes, Verbal Permission Granted  Name::        Agency::   [facility]; [facility])  Relationship::     Contact Information:     Housing/Transportation Living arrangements for the past 2 months:  [place] of Information:  Patient Patient Interpreter Needed:  None Criminal Activity/Legal Involvement Pertinent to Current Situation/Hospitalization:  No - Comment as needed Significant Relationships:  Neighbor Lives with:  Self Do you feel safe going back to the place where you live?  No (fall risk ) Need for family participation in patient care:  No (Coment)  Care giving concerns:  No caregiver present   Facilities manager / plan:  CSW met with patient to review disposition after PT evaluation/recommendation: SNF. Patient fell over a friend's dog while visiting her friend.  Patient states there is no possible way she can be discharged home as she has no assistance and will need assistance and therapies at time of discharge.  Patient states she has familiarity with [facility] and would like to check on the possibility of receiving STR at [facility] at time of discharge.  CSW sent clinical referral and will continue to follow and assist with discharge plans.  Employment status:  Retired Forensic scientist:  [employer] PT Recommendations:  [place] / Referral to community resources:  [place]  Patient/Family's Response to care:  agreeable  Patient/Family's Understanding of and Emotional Response to Diagnosis, Current Treatment, and Prognosis:  Patient is very motivated and continues  to be realistic regarding level of care needed at time of discharge.   Emotional Assessment Appearance:  Appears younger than stated age Attitude/Demeanor/Rapport:   (appropriate) Affect (typically observed):  Accepting, Adaptable Orientation:  Oriented to Self, Oriented to Place, Oriented to  Time, Oriented to Situation Alcohol / Substance use:  Not Applicable Psych involvement (Current and /or in the community):  No (Comment)  Discharge Needs  Concerns to be addressed:  No discharge needs identified Readmission within the last 30 days:    Current discharge risk:  None Barriers to Discharge:  No Barriers Identified   Dulcy Fanny, LCSW 03/25/2015, 12:17 PM

## 2015-03-25 NOTE — Evaluation (Signed)
Occupational Therapy Evaluation Patient Details Name: Krystal [name] MRN: [id number] DOB: [birth year] Today's Date: 03/25/2015    History of Present Illness Krystal [name] is a 81 y.o. female past medical history of osteo-porosis, arthritis chronic back pain presents emergency department with the chief complaint of left hip pain after falling when a dog jumped on her. Initial evaluation reveals intertrochanteric fracture proximal left femur. Underwent IM nailing   Clinical Impression   [name] currently needs mod assist overall for simulated selfcare tasks.  Feel she will need extensive SNF level rehab to achieve modified independent level for discharge home.  Will defer further OT treatment to SNF as she will likely leave in the next day or so if medically stable per MD.     Follow Up Recommendations  SNF    Equipment Recommendations  Other (comment) (TBD next venue of care)       Precautions / Restrictions Precautions Precautions: Fall Restrictions Weight Bearing Restrictions: Yes LLE Weight Bearing: Partial weight bearing LLE Partial Weight Bearing Percentage or Pounds: 30 %      Mobility Bed Mobility Overal bed mobility: Needs Assistance Bed Mobility: Supine to Sit;Sit to Supine     Supine to sit: Min assist Sit to supine: Min assist   General bed mobility comments: Pt needs assistance with moving the LLE in and out of the bed.   Transfers Overall transfer level: Needs assistance Equipment used: Rolling walker (2 wheeled) Transfers: Sit to/from [facility] Sit to Stand: Min assist Stand pivot transfers: Min assist       General transfer comment: Min assist for sit to stand however pt with decreased ability to maintain 30% weightbearing over the LLE in order to move the RLE.  Pt relying on heel to toe method for advancing the RLE.    Balance     Sitting balance-Leahy Scale: Good       Standing balance-Leahy Scale: Poor                              ADL Overall ADL's : Needs assistance/impaired Eating/Feeding: Modified independent;Sitting   Grooming: Wash/dry hands;Wash/dry face;Sitting;Supervision/safety   Upper Body Bathing: Supervision/ safety;Sitting   Lower Body Bathing: Moderate assistance;Sit to/from stand   Upper Body Dressing : Sitting;Supervision/safety   Lower Body Dressing: Sit to/from stand;Moderate assistance   Toilet Transfer: RW;BSC;Stand-pivot   Toileting- Clothing Manipulation and Hygiene: Minimal assistance;Sit to/from stand         General ADL Comments: Pt only stood to take a couple of steps up toward the top of the bed during session.  She was unable to lift the RLE off of the ground and instead used heel to toe method for transfer.  Began education on AE use however pt will need extensive practice as she has visual impairment.  Max assist for use of sockaide as pt cannot reach down below her knees secondary to increased hip pain.  Will defer continued OT treatment to SNF with likely discharge tomorrow.      Vision Vision Assessment?: No apparent visual deficits   Perception Perception Perception Tested?: No   Praxis Praxis Praxis tested?: Within functional limits    Pertinent Vitals/Pain Pain Assessment: 0-10 Pain Score: 3  Pain Location: left hip Pain Intervention(s): Repositioned;Monitored during session     Hand Dominance  right   Extremity/Trunk Assessment Upper Extremity Assessment Upper Extremity Assessment: Generalized weakness   Lower Extremity Assessment Lower Extremity Assessment:  Defer to PT evaluation   Cervical / Trunk Assessment Cervical / Trunk Assessment: Kyphotic   Communication Communication Communication: No difficulties   Cognition Arousal/Alertness: Awake/alert Behavior During Therapy: WFL for tasks assessed/performed Overall Cognitive Status: Within Functional Limits for tasks assessed                                Home  Living Family/patient expects to be discharged to:: Private residence   Available Help at Discharge: [place]                             Additional Comments: pt lives alone      Prior Functioning/Environment Level of Independence: Independent             OT Diagnosis: Generalized weakness;Acute pain;Blindness and low vision   OT Problem List: Decreased strength;Impaired balance (sitting and/or standing);Pain;Decreased knowledge of use of DME or AE                       End of Session Equipment Utilized During Treatment: Rolling walker Nurse Communication: Mobility status;Other (comment) (Pt's IV leaking)  Activity Tolerance: Patient tolerated treatment well Patient left: in bed;with call bell/phone within reach   Time: 1510-1552 OT Time Calculation (min): 42 min Charges:  OT General Charges $OT Visit: 1 Procedure OT Evaluation $Initial OT Evaluation Tier I: 1 Procedure OT Treatments $Self Care/Home Management : 23-37 mins  MCGUIRE,JAMES OTR/L 03/25/2015, 4:04 PM

## 2015-03-25 NOTE — Clinical Social Work Placement (Signed)
   CLINICAL SOCIAL WORK PLACEMENT  NOTE  Date:  03/25/2015  Patient Details  Name: Krystal [name] MRN: [id number] Date of Birth: [birth year]  Clinical Social Work is seeking post-discharge placement for this patient at the [place] level of care (*CSW will initial, date and re-position this form in  chart as items are completed):  Yes   Patient/family provided with [place] Work Department's list of facilities offering this level of care within the geographic area requested by the patient (or if unable, by the patient's family).  Yes   Patient/family informed of their freedom to choose among providers that offer the needed level of care, that participate in Medicare, Medicaid or managed care program needed by the patient, have an available bed and are willing to accept the patient.  Yes   Patient/family informed of [facility]'s ownership interest in [facility] and [facility], as well as of the fact that they are under no obligation to receive care at these facilities.  PASRR submitted to EDS on 03/25/15     PASRR number received on 03/25/15     Existing PASRR number confirmed on       FL2 transmitted to all facilities in geographic area requested by pt/family on       FL2 transmitted to all facilities within larger geographic area on       Patient informed that his/her managed care company has contracts with or will negotiate with certain facilities, including the following:        Yes   Patient/family informed of bed offers received.  Patient chooses bed at [facility]     Physician recommends and patient chooses bed at      Patient to be transferred to [facility] on 03/26/15.  Patient to be transferred to facility by [facility]     Patient family notified on 03/26/15 of transfer.  Name of family member notified:  patient is alert and oriented and does not request CSW to make any further contacts      PHYSICIAN Please prepare priority discharge summary, including medications     Additional Comment:    _______________________________________________ Dulcy Fanny, LCSW 03/25/2015, 12:19 PM

## 2015-03-25 NOTE — Anesthesia Postprocedure Evaluation (Signed)
  Anesthesia Post-op Note  Patient: Krystal [name]  Procedure(s) Performed: Procedure(s): INTRAMEDULLARY (IM) NAIL INTERTROCHANTRIC (Left)  Patient Location: PACU  Anesthesia Type:General  Level of Consciousness: awake, alert  and patient cooperative  Airway and Oxygen Therapy: Patient Spontanous Breathing and Patient connected to nasal cannula oxygen  Post-op Pain: mild  Post-op Assessment: Post-op Vital signs reviewed, Patient's Cardiovascular Status Stable, Respiratory Function Stable, Patent Airway, No signs of Nausea or vomiting and Pain level controlled LLE Motor Response: Purposeful movement, Responds to commands LLE Sensation: Full sensation, Pain RLE Motor Response: Purposeful movement, Responds to commands RLE Sensation: Full sensation, No numbness      Post-op Vital Signs: Reviewed and stable  Last Vitals:  Filed Vitals:   03/25/15 1516  BP: 121/41  Pulse: 77  Temp:   Resp:     Complications: No apparent anesthesia complications

## 2015-03-25 NOTE — Progress Notes (Signed)
   Subjective:  Patient reports pain as mild to moderate.  Complains that she is getting too many xrays. Denies N/V/Cp/SOB.  Objective:   VITALS:   Filed Vitals:   03/24/15 2020 03/24/15 2039 03/25/15 0149 03/25/15 0547  BP:  128/53 113/51 113/36  Pulse: 63 61 77 78  Temp: 97.2 F (36.2 C) 97.5 F (36.4 C) 98.8 F (37.1 C) 98.4 F (36.9 C)  TempSrc:      Resp: 11 16 16 16   Height:      Weight:      SpO2: 98% 100% 95% 94%    ABD soft Sensation intact distally Intact pulses distally Dorsiflexion/Plantar flexion intact Incision: dressing C/D/I Compartment soft   Lab Results  Component Value Date   WBC 6.5 03/25/2015   HGB 10.2* 03/25/2015   HCT 31.0* 03/25/2015   MCV 93.4 03/25/2015   PLT 136* 03/25/2015   BMET    Component Value Date/Time   NA 135 03/24/2015 0454   K 4.0 03/24/2015 0454   CL 97* 03/24/2015 0454   CO2 30 03/24/2015 0454   GLUCOSE 142* 03/24/2015 0454   BUN 19 03/24/2015 0454   CREATININE 0.61 03/24/2015 0454   CALCIUM 9.3 03/24/2015 0454   GFRNONAA >60 03/24/2015 0454   GFRAA >60 03/24/2015 0454     Assessment/Plan: 1 Day Post-Op   Principal Problem:   Intertrochanteric fracture of left hip Active Problems:   Hyponatremia   Hyperglycemia   Hip fx   Pertrochanteric fracture of left femur   30% WB LLE with walker lovenox x30 days for DVT ppx PT/OT PO pain control D/C planning   Swinteck, Horald Pollen 03/25/2015, 7:36 AM   Rod Can, MD Cell [phone]

## 2015-03-25 NOTE — Evaluation (Signed)
Physical Therapy Evaluation Patient Details Name: Krystal [name] MRN: [id number] DOB: 30-Jul-1931 Today's Date: 03/25/2015   History of Present Illness  Krystal [name] is a 81 y.o. female past medical history of osteo-porosis, arthritis chronic back pain presents emergency department with the chief complaint of left hip pain after falling when a dog jumped on her. Initial evaluation reveals intertrochanteric fracture proximal left femur. Underwent IM nailing  Clinical Impression  Pt admitted with above diagnosis. Pt currently with functional limitations due to the deficits listed below (see PT Problem List). Pt transferred bed to chair with mod A to stand and min A to pivot with RW.  Pt will benefit from skilled PT to increase their independence and safety with mobility to allow discharge to the venue listed below.       Follow Up Recommendations SNF;Supervision/Assistance - 24 hour    Equipment Recommendations  Rolling walker with 5" wheels    Recommendations for Other Services OT consult     Precautions / Restrictions Precautions Precautions: Fall Precaution Comments: pt reports having balance issues at baseline and has macular degeneration as well as osteoporosis Restrictions Weight Bearing Restrictions: Yes LLE Weight Bearing: Partial weight bearing LLE Partial Weight Bearing Percentage or Pounds: 30      Mobility  Bed Mobility Overal bed mobility: Needs Assistance Bed Mobility: Supine to Sit     Supine to sit: Max assist     General bed mobility comments: max A to pivot hips to EOB with bed pad, this was painful for pt. Pt was able to assist by pulling on rail with right hand across body  Transfers Overall transfer level: Needs assistance Equipment used: Rolling walker (2 wheeled) Transfers: Sit to/from [facility] Sit to Stand: Mod assist Stand pivot transfers: Min assist       General transfer comment: mod A for power up, vc's for only 30% WB'ing  which pt kept effectively. Pivot steps to chair. Pt was very fearful of lifting right LE because of wt on left so pivoted feet instead and slid them on floor  Ambulation/Gait             General Gait Details: unable today  Stairs            Wheelchair Mobility    Modified Rankin (Stroke Patients Only)       Balance Overall balance assessment: Needs assistance Sitting-balance support: Feet supported;Bilateral upper extremity supported Sitting balance-Leahy Scale: Good   Postural control: Right lateral lean Standing balance support: Bilateral upper extremity supported Standing balance-Leahy Scale: Poor                               Pertinent Vitals/Pain Pain Assessment: Faces Faces Pain Scale: Hurts even more Pain Location: left hip with movement (no pain at rest) Pain Intervention(s): Limited activity within patient's tolerance;Monitored during session    Home Living Family/patient expects to be discharged to:: Skilled nursing facility                 Additional Comments: pt lives alone    Prior Function Level of Independence: Independent               Hand Dominance        Extremity/Trunk Assessment   Upper Extremity Assessment: Defer to OT evaluation           Lower Extremity Assessment: LLE deficits/detail   LLE Deficits / Details: hip  flex 1/5, knee ext 2/5, knee flex 2/5  Cervical / Trunk Assessment: Normal  Communication   Communication: No difficulties  Cognition Arousal/Alertness: Awake/alert Behavior During Therapy: WFL for tasks assessed/performed Overall Cognitive Status: Within Functional Limits for tasks assessed                      General Comments      Exercises General Exercises - Lower Extremity Ankle Circles/Pumps: AROM;Both;10 reps;Seated      Assessment/Plan    PT Assessment Patient needs continued PT services  PT Diagnosis Difficulty walking;Abnormality of gait;Acute pain    PT Problem List Decreased strength;Decreased activity tolerance;Decreased balance;Decreased mobility;Decreased knowledge of use of DME;Decreased knowledge of precautions;Pain  PT Treatment Interventions DME instruction;Gait training;Functional mobility training;Therapeutic activities;Therapeutic exercise;Balance training;Patient/family education;Neuromuscular re-education   PT Goals (Current goals can be found in the Care Plan section) Acute Rehab PT Goals Patient Stated Goal: return home and be independent PT Goal Formulation: With patient Time For Goal Achievement: 04/01/15 Potential to Achieve Goals: Good    Frequency Min 4X/week   Barriers to discharge Decreased caregiver support will need post acute rehab    Co-evaluation               End of Session Equipment Utilized During Treatment: Gait belt Activity Tolerance: Patient tolerated treatment well Patient left: in chair;with call bell/phone within reach Nurse Communication: Mobility status         Time: 5974-1638 PT Time Calculation (min) (ACUTE ONLY): 31 min   Charges:   PT Evaluation $Initial PT Evaluation Tier I: 1 Procedure PT Treatments $Therapeutic Activity: 8-22 mins   PT G Codes:       Leighton Roach, PT  Acute Rehab Services  [phone]' [place], [place] 03/25/2015, 11:59 AM

## 2015-03-25 NOTE — Progress Notes (Signed)
Initial Nutrition Assessment  DOCUMENTATION CODES:   Underweight  INTERVENTION:   Glucerna Shake po daily prn, each supplement provides 220 kcal and 10 grams of protein  NUTRITION DIAGNOSIS:   Increased nutrient needs related to  (post op healing) as evidenced by estimated needs  GOAL:   Patient will meet greater than or equal to 90% of their needs  MONITOR:   PO intake, Supplement acceptance, Labs, Weight trends, I & O's  REASON FOR ASSESSMENT:   Consult Diet education  ASSESSMENT:   81 y.o. Female with PMH of osteoporosis, arthritis chronic back pain presented to ED with the chief complaint of left hip pain. Initial evaluation reveals intertrochanteric fracture proximal left femur.    Patient s/p procedure CEPHALOMEDULLARY NAIL FIXATION OF LEFT PERTROCHANTERIC FEMUR FRACTURE  Patient reports her appetite isn't very good in the hospital.  Having pain and hurting "like the dickens".  Typically eats well and makes healthy meal choices.  Weight has been stable.  Amenable to having oral nutrition supplements available as needed.  Limited Nutrition-Focused physical exam completed. Findings are no fat depletion, severe muscle depletion, and no edema.  Diet Order:  Diet Carb Modified Fluid consistency:: Thin; Room service appropriate?: Yes  Skin:  Reviewed, no issues  Last BM:  9/25  Height:   Ht Readings from Last 1 Encounters:  03/24/15 5\' 10"  (1.778 m)    Weight:   Wt Readings from Last 1 Encounters:  03/24/15 110 lb (49.896 kg)    Ideal Body Weight:  68 kg  BMI:  Body mass index is 15.78 kg/(m^2).  Estimated Nutritional Needs:   Kcal:  1250-1450  Protein:  60-70 gm  Fluid:  >/= 1.5 L  EDUCATION NEEDS:   Education needs addressed  -- Carbohydrate Counting for People With Diabetes handouts provided  Arthur Holms, RD, LDN Pager #: [phone] After-Hours Pager #: [phone]

## 2015-03-25 NOTE — Discharge Instructions (Signed)
Dr. Rod Can Hip & Knee Specialist [facility] [address], [place], [place] [address] [phone]    POSTOPERATIVE DIRECTIONS    Hip Rehabilitation, Guidelines Following Surgery   WEIGHT BEARING Partial weight bearing with assist device as directed.  30% left leg with a walker  The results of a hip operation are greatly improved after range of motion and muscle strengthening exercises. Follow all safety measures which are given to protect your hip. If any of these exercises cause increased pain or swelling in your joint, decrease the amount until you are comfortable again. Then slowly increase the exercises. Call your caregiver if you have problems or questions.   RANGE OF MOTION AND STRENGTHENING EXERCISES  These exercises are designed to help you keep full movement of your hip joint. Follow your caregiver's or physical therapist's instructions. Perform all exercises about fifteen times, three times per day or as directed. Exercise both hips, even if you have had only one joint replacement. These exercises can be done on a training (exercise) mat, on the floor, on a table or on a bed. Use whatever works the best and is most comfortable for you. Use music or television while you are exercising so that the exercises are a pleasant break in your day. This will make your life better with the exercises acting as a break in routine you can look forward to.  Lying on your back, slowly slide your foot toward your buttocks, raising your knee up off the floor. Then slowly slide your foot back down until your leg is straight again.  Lying on your back spread your legs as far apart as you can without causing discomfort.  Lying on your side, raise your upper leg and foot straight up from the floor as far as is comfortable. Slowly lower the leg and repeat.  Lying on your back, tighten up the muscle in the front of your thigh (quadriceps muscles). You can do this by  keeping your leg straight and trying to raise your heel off the floor. This helps strengthen the largest muscle supporting your knee.  Lying on your back, tighten up the muscles of your buttocks both with the legs straight and with the knee bent at a comfortable angle while keeping your heel on the floor.   SKILLED REHAB INSTRUCTIONS: If the patient is transferred to a skilled rehab facility following release from the hospital, a list of the current medications will be sent to the facility for the patient to continue.  When discharged from the skilled rehab facility, please have the facility set up the patient's [place] prior to being released. Also, the skilled facility will be responsible for providing the patient with their medications at time of release from the facility to include their pain medication and their blood thinner medication. If the patient is still at the rehab facility at time of the two week follow up appointment, the skilled rehab facility will also need to assist the patient in arranging follow up appointment in our office and any transportation needs.  MAKE SURE YOU:  Understand these instructions.  Will watch your condition.  Will get help right away if you are not doing well or get worse.  Pick up stool softner and laxative for home use following surgery while on pain medications. Change your dressing daily as needed. Continue to use ice for pain and swelling after surgery. Do not use any lotions or creams on the incision until instructed by  your surgeon.

## 2015-03-25 NOTE — Op Note (Signed)
NAMESARENA, JEZEK NO.:  [id number]  MEDICAL RECORD NO.:  [id number]  LOCATION:  5N08C                        FACILITY:  [facility]  PHYSICIAN:  Rod Can, MD     DATE OF BIRTH:  02-Mar-1932  DATE OF PROCEDURE:  03/24/2015 DATE OF DISCHARGE:                              OPERATIVE REPORT   SURGEON:  Rod Can, MD.  ASSISTANT:  None.  PREOPERATIVE DIAGNOSIS:  Left pertrochanteric femur fracture with basicervical component.  POSTOPERATIVE DIAGNOSIS:  Left pertrochanteric femur fracture with basicervical component.  PROCEDURE PERFORMED:  Cephalomedullary nail fixation of left pertrochanteric femur fracture.  IMPLANTS: 1. Synthes TFN advanced femoral nail 11 x 420 mm, 130 degrees. 2. TFNA helical blade 90 mm. 3. A 5 mm distal interlocking screw x1.  ANESTHESIA:  General.  ANTIBIOTICS:  1 g Ancef.  COMPLICATIONS:  None.  EBL:  150 mL.  DISPOSITION:  Stable to PACU.  SPECIMENS:  None.  INDICATIONS:  The patient is an 81 year old female, who was knocked over by her dog last night.  She had hip pain and inability to weight bear. She was brought to the emergency department where x-rays revealed a pertrochanteric femur fracture with the above mentioned pattern.  She was admitted to the Hospitalist Service.  She underwent perioperative risk stratification, medical optimization.  Risks, benefits, and alternatives of above-mentioned procedure were explained, and the patient elected to proceed.  DESCRIPTION OF PROCEDURE:  The patient was correctly identified in the holding area using 2 identifiers.  I marked the surgical site.  She was taken to the operating room.  General anesthesia was induced on the bed. She already had a Foley catheter.  She was transferred to the [facility] table.  Right lower extremity was scissored underneath the left.  I reduced the fractured with standard traction internal rotation and adduction.  The left lower extremity was  prepped and draped in normal surgical fashion.  Time-out was called verifying side and site of surgery.  I made a standard 4 cm incision proximal to the tip of the trochanter.  I used an awl to find the starting point for trochanteric entry nail and I passed the guide wire.  I then used an entry reamer and then I placed the guidewire down to the physeal scar of the knee.  The fracture had a near anatomic reduction and then I measured the length of the nail, I reamed up to a 12.5 reamer, and the real nail was selected and impacted into place without any difficulty.  Through a separate stab incision, I inserted the cannula for the cephalomedullary device down to the bone.  I then placed the guide pin center-center using AP and lateral fluoroscopy views.  I measured the length of the cephalomedullary device and then I drilled the appropriate length.  The real TFNA blade was placed.  Tip apex distance was appropriate. Fracture reduction was near anatomic.  Neck shaft angle was very good. I then compressed the fracture through the jig and I tightened the set screw.  I then removed the jig entirely.  I turned my attention distally.  Using perfect circle technique, I placed 1 distal interlocking screw.  Final  AP and lateral fluoroscopy views were obtained to confirm fracture reduction and appropriate hardware placement.  Tip apex distance was appropriate.  There was no chondral penetration.  Wounds were copiously irrigated with saline.  I then closed the fascia with #1 Vicryl.  2-0 Monocryl for the deep dermal layer, running 3-0 Monocryl subcuticular stitch.  Glue was applied to the skin.  Once the glue was dried, sterile dressings were applied.  The patient was then transferred to bed, extubated, taken to PACU in stable condition.  Sponge, needle, and instrument counts were correct at the end of the case x2.  There were no known complications.  We will readmit the patient to the Hospitalist  Service.  She will be touchdown weightbearing, left lower extremity with a walker.  We are going to place her on chemical DVT prophylaxis in the form of Lovenox. We will have her work with Physical and Occupational Therapy.  We will work on disposition planning.  I will see her in the office 2 weeks after discharge.          ______________________________ Rod Can, MD     BS/MEDQ  D:  03/24/2015  T:  03/25/2015  Job:  [id number]

## 2015-03-25 NOTE — Progress Notes (Signed)
[facility] Progress Note   Krystal [name]  HKV:[id number]  DOB: 1932-03-26  DOA: 03/23/2015 PCP: Redge Gainer, MD  Brief narrative: Krystal [name] is a 81 y.o. female with osteoporosis  and arthritis presented to the hospital with left hip pain after a fall and is found to have a left intertrochanteric fracture of the femur. Underwent repair 9/   Subjective:  Fair Pain is controlled She is worried about her leg pain and wonders when she will ambulate  Assessment/Plan: Principal Problem:   Intertrochanteric fracture of left hip -Management per orthopedic surgery -30% WBAT -Lovenox for DVT prophylaxis  Active Problems:   Hyponatremia/hypokalemia -Improved with IV fluids-she states that she has not eaten much in the past 3 days -saline lock iv 9/28  Expected blood loss anemai from surgery/Hemodilution -monitor in am    Hyperglycemia/ diabetes mellitus -A1c 6.1- have recommended dietary changes- nutrition consult requested   Code Status:     Code Status Orders        Start     Ordered   03/23/15 1829  Full code   Continuous     03/23/15 1828    Advance Directive Documentation        Most Recent Value   Type of Advance Directive  Healthcare Power of [place], Living will [employer] ]   Pre-existing out of facility DNR order (yellow form or pink MOST form)     "MOST" Form in Place?       Disposition Plan: Home when stable DVT prophylaxis: Per orthopedic surgery Consultants: Orthopedic surgery Procedures:  Antibiotics: Anti-infectives    Start     Dose/Rate Route Frequency Ordered Stop   03/24/15 2300  ceFAZolin (ANCEF) IVPB 2 g/50 mL premix     2 g 100 mL/hr over 30 Minutes Intravenous Every 6 hours 03/24/15 2039 03/25/15 0715   03/24/15 0430  ceFAZolin (ANCEF) IVPB 2 g/50 mL premix     2 g 100 mL/hr over 30 Minutes Intravenous To [facility] 03/23/15 2215 03/24/15 1730      Objective: Filed Weights   03/23/15 1133 03/24/15 1633   Weight: 49.896 kg (110 lb) 49.896 kg (110 lb)    Intake/Output Summary (Last 24 hours) at 03/25/15 1010 Last data filed at 03/25/15 0654  Gross per 24 hour  Intake 2348.75 ml  Output   1550 ml  Net 798.75 ml     Vitals Filed Vitals:   03/24/15 2020 03/24/15 2039 03/25/15 0149 03/25/15 0547  BP:  128/53 113/51 113/36  Pulse: 63 61 77 78  Temp: 97.2 F (36.2 C) 97.5 F (36.4 C) 98.8 F (37.1 C) 98.4 F (36.9 C)  TempSrc:      Resp: 11 16 16 16   Height:      Weight:      SpO2: 98% 100% 95% 94%    Exam:  General:  Pt is alert, not in acute distress  HEENT: No icterus, No thrush, oral mucosa moist  Cardiovascular: regular rate and rhythm, S1/S2 No murmur  Respiratory: clear to auscultation bilaterally   Abdomen: Soft, +Bowel sounds, non tender, non distended, no guarding   Data Reviewed: Basic Metabolic Panel:  Recent Labs Lab 03/23/15 1300 03/24/15 0454 03/25/15 0610  NA 132* 135 132*  K 3.5 4.0 3.4*  CL 96* 97* 100*  CO2 29 30 25   GLUCOSE 122* 142* 172*  BUN 18 19 16   CREATININE 0.51 0.61 0.48  CALCIUM 8.8* 9.3 8.1*   Liver Function Tests: No results for  input(s): AST, ALT, ALKPHOS, BILITOT, PROT, ALBUMIN in the last 168 hours. No results for input(s): LIPASE, AMYLASE in the last 168 hours. No results for input(s): AMMONIA in the last 168 hours. CBC:  Recent Labs Lab 03/23/15 1300 03/24/15 0454 03/25/15 0610  WBC 8.8 7.2 6.5  NEUTROABS 7.5  --   --   HGB 13.5 12.6 10.2*  HCT 39.1 37.0 31.0*  MCV 92.7 93.2 93.4  PLT 194 159 136*   Cardiac Enzymes: No results for input(s): CKTOTAL, CKMB, CKMBINDEX, TROPONINI in the last 168 hours. BNP (last 3 results) No results for input(s): BNP in the last 8760 hours.  ProBNP (last 3 results) No results for input(s): PROBNP in the last 8760 hours.  CBG:  Recent Labs Lab 03/24/15 1605  GLUCAP 89    Recent Results (from the past 240 hour(s))  Surgical pcr screen     Status: None   Collection  Time: 03/24/15  6:40 AM  Result Value Ref Range Status   MRSA, PCR NEGATIVE NEGATIVE Final   Staphylococcus aureus NEGATIVE NEGATIVE Final    Comment:        The Xpert SA Assay (FDA approved for NASAL specimens in patients over 26 years of age), is one component of a comprehensive surveillance program.  Test performance has been validated by [facility] [place] for patients greater than or equal to 38 year old. It is not intended to diagnose infection nor to guide or monitor treatment.      Studies: Pelvis Portable  03/24/2015   CLINICAL DATA:  Postop check  EXAM: PORTABLE PELVIS 1-2 VIEWS  COMPARISON:  03/24/2015  FINDINGS: Status post open reduction internal fixation of an intertrochanteric left femur fracture, with more complete visualization on dedicated femur imaging. No evidence of periprosthetic postoperative fracture.  Both hips are located. No evidence of pelvic ring fracture or diastasis, with limitation due to leftward rotation.  IMPRESSION: Intertrochanteric left femur fracture ORIF.  No acute finding.   Electronically Signed   By: Monte Fantasia M.D.   On: 03/24/2015 20:59   Ct Hip Left Wo Contrast  03/23/2015   CLINICAL DATA:  Pt reports dog jumped up on her and pushed her down, landing on left hip. Pt c/o hip pain, no deformity or weakness.  EXAM: CT OF THE LEFT HIP WITHOUT CONTRAST  TECHNIQUE: Multidetector CT imaging of the left hip was performed according to the standard protocol. Multiplanar CT image reconstructions were also generated.  COMPARISON:  None.  FINDINGS: There is a mildly comminuted left femoral basicervical fracture with an intertrochanteric component and a fracture cleft extending into the proximal femoral diaphysis. There is no dislocation. There is no lytic or sclerotic osseous lesion. There is severe osteopenia. There is mild osteoarthritis of the left hip.  There is sequela of prior avulsive injury of the left hamstring origin.  There is fat stranding anterior  to the rectus femoris muscle likely representing a small amount of hemorrhage. There is no focal fluid collection or hematoma.  IMPRESSION: 1. Mildly comminuted left femoral basicervical fracture with an intertrochanteric component and a fracture cleft extending into the proximal femoral diaphysis along the medial aspect. There is no dislocation.   Electronically Signed   By: Kathreen Devoid   On: 03/23/2015 14:33   Dg C-arm 1-60 Min  03/24/2015   CLINICAL DATA:  ORIF left femur fracture  EXAM: LEFT FEMUR 2 VIEWS; DG C-ARM 61-120 MIN  COMPARISON:  None  FLUOROSCOPY TIME:  2 minutes 4 seconds  Four images  FINDINGS: Interval placement of a left femoral IM nail with an interlocking cannulated femoral neck screw transfixing a comminuted left intertrochanteric fracture in near anatomic alignment. No dislocation.  IMPRESSION: ORIF left intertrochanteric fracture.   Electronically Signed   By: Kathreen Devoid   On: 03/24/2015 18:44   Dg Hip Unilat With Pelvis 2-3 Views Left  03/23/2015   CLINICAL DATA:  Left hip pain  EXAM: DG HIP (WITH OR WITHOUT PELVIS) 2-3V LEFT  COMPARISON:  None  FINDINGS: Mild to moderate bilateral hip osteoarthritis. There is moderate diffuse osteopenia. Suspicious lucency extending through the intertrochanteric portion of the proximal left femur is identified. No dislocation.  IMPRESSION: 1. Examination is suspicious for intertrochanteric fracture the proximal left femur. Suggests confirmatory imaging with cross-sectional exam either MRI or CT. Consider further evaluation with cross-sectional imaging. The study of choice would be a MRI.   Electronically Signed   By: Kerby Moors M.D.   On: 03/23/2015 13:03   Dg Femur Min 2 Views Left  03/24/2015   CLINICAL DATA:  ORIF left femur fracture  EXAM: LEFT FEMUR 2 VIEWS  COMPARISON:  None.  FINDINGS: Left femoral IM nail with interlocking femoral neck cannulated screw transfixing an intertrochanteric fracture with approximately 5 mm of distraction.  No dislocation. Postsurgical changes in the surrounding soft tissues.  IMPRESSION: ORIF left femoral intertrochanteric fracture.   Electronically Signed   By: Kathreen Devoid   On: 03/24/2015 20:57   Dg Femur Min 2 Views Left  03/24/2015   CLINICAL DATA:  ORIF left femur fracture  EXAM: LEFT FEMUR 2 VIEWS; DG C-ARM 61-120 MIN  COMPARISON:  None  FLUOROSCOPY TIME:  2 minutes 4 seconds  Four images  FINDINGS: Interval placement of a left femoral IM nail with an interlocking cannulated femoral neck screw transfixing a comminuted left intertrochanteric fracture in near anatomic alignment. No dislocation.  IMPRESSION: ORIF left intertrochanteric fracture.   Electronically Signed   By: Kathreen Devoid   On: 03/24/2015 18:44   Dg Femur Min 2 Views Left  03/24/2015   CLINICAL DATA:  initial encounter left proximal intertrochanteric femur fracture  EXAM: LEFT FEMUR 2 VIEWS  COMPARISON:  None.  FINDINGS: There is an intertrochanteric left femur fracture which is mildly comminuted and mildly angulated with mild Barrett's angulation.  IMPRESSION: Proximal left femur fracture   Electronically Signed   By: Skipper Cliche M.D.   On: 03/24/2015 14:19    Scheduled Meds:  Scheduled Meds: . docusate sodium  100 mg Oral BID  . dorzolamide-timolol  1 drop Right Eye BID  . enoxaparin (LOVENOX) injection  30 mg Subcutaneous Q24H  . Influenza vac split quadrivalent PF  0.5 mL Intramuscular Tomorrow-1000  . latanoprost  1 drop Both Eyes QHS  . multivitamin with minerals  1 tablet Oral Daily   Continuous Infusions: . sodium chloride 125 mL/hr at 03/24/15 2225    Time spent on care of this patient: 25 min Verneita Griffes, MD [facility] [phone] com

## 2015-03-25 NOTE — Clinical Social Work Note (Signed)
Patient chose bed at [facility] at time of discharge.  Patient was offered a semi-private room until a private room is available.  Patient is agreeable to this plan.  Nonnie Done, LCSW [phone]  Psychiatric & Orthopedics (5N 1-8) Clinical Social Worker

## 2015-03-26 ENCOUNTER — Encounter (HOSPITAL_COMMUNITY): Payer: Self-pay | Admitting: General Practice

## 2015-03-26 DIAGNOSIS — E876 Hypokalemia: Secondary | ICD-10-CM | POA: Diagnosis not present

## 2015-03-26 DIAGNOSIS — M25552 Pain in left hip: Secondary | ICD-10-CM | POA: Diagnosis present

## 2015-03-26 DIAGNOSIS — S72102D Unspecified trochanteric fracture of left femur, subsequent encounter for closed fracture with routine healing: Secondary | ICD-10-CM | POA: Diagnosis not present

## 2015-03-26 DIAGNOSIS — S72102A Unspecified trochanteric fracture of left femur, initial encounter for closed fracture: Secondary | ICD-10-CM

## 2015-03-26 DIAGNOSIS — Z87891 Personal history of nicotine dependence: Secondary | ICD-10-CM | POA: Diagnosis not present

## 2015-03-26 DIAGNOSIS — M1990 Unspecified osteoarthritis, unspecified site: Secondary | ICD-10-CM | POA: Diagnosis not present

## 2015-03-26 DIAGNOSIS — H409 Unspecified glaucoma: Secondary | ICD-10-CM | POA: Diagnosis not present

## 2015-03-26 DIAGNOSIS — M810 Age-related osteoporosis without current pathological fracture: Secondary | ICD-10-CM | POA: Diagnosis not present

## 2015-03-26 DIAGNOSIS — Z9181 History of falling: Secondary | ICD-10-CM | POA: Diagnosis not present

## 2015-03-26 DIAGNOSIS — R278 Other lack of coordination: Secondary | ICD-10-CM | POA: Diagnosis not present

## 2015-03-26 DIAGNOSIS — Z96642 Presence of left artificial hip joint: Secondary | ICD-10-CM | POA: Diagnosis not present

## 2015-03-26 DIAGNOSIS — E1165 Type 2 diabetes mellitus with hyperglycemia: Secondary | ICD-10-CM | POA: Diagnosis not present

## 2015-03-26 DIAGNOSIS — E440 Moderate protein-calorie malnutrition: Secondary | ICD-10-CM | POA: Diagnosis not present

## 2015-03-26 DIAGNOSIS — H548 Legal blindness, as defined in USA: Secondary | ICD-10-CM | POA: Diagnosis not present

## 2015-03-26 DIAGNOSIS — R739 Hyperglycemia, unspecified: Secondary | ICD-10-CM | POA: Diagnosis not present

## 2015-03-26 DIAGNOSIS — M6281 Muscle weakness (generalized): Secondary | ICD-10-CM | POA: Diagnosis not present

## 2015-03-26 DIAGNOSIS — Z4789 Encounter for other orthopedic aftercare: Secondary | ICD-10-CM | POA: Diagnosis not present

## 2015-03-26 DIAGNOSIS — D649 Anemia, unspecified: Secondary | ICD-10-CM | POA: Diagnosis not present

## 2015-03-26 DIAGNOSIS — Z79899 Other long term (current) drug therapy: Secondary | ICD-10-CM | POA: Diagnosis not present

## 2015-03-26 DIAGNOSIS — M21259 Flexion deformity, unspecified hip: Secondary | ICD-10-CM | POA: Diagnosis not present

## 2015-03-26 DIAGNOSIS — S72145D Nondisplaced intertrochanteric fracture of left femur, subsequent encounter for closed fracture with routine healing: Secondary | ICD-10-CM | POA: Diagnosis not present

## 2015-03-26 DIAGNOSIS — S72002A Fracture of unspecified part of neck of left femur, initial encounter for closed fracture: Secondary | ICD-10-CM | POA: Diagnosis not present

## 2015-03-26 DIAGNOSIS — H540 Blindness, both eyes: Secondary | ICD-10-CM | POA: Diagnosis not present

## 2015-03-26 DIAGNOSIS — R262 Difficulty in walking, not elsewhere classified: Secondary | ICD-10-CM | POA: Diagnosis not present

## 2015-03-26 DIAGNOSIS — W1839XA Other fall on same level, initial encounter: Secondary | ICD-10-CM | POA: Diagnosis not present

## 2015-03-26 DIAGNOSIS — Z681 Body mass index (BMI) 19 or less, adult: Secondary | ICD-10-CM | POA: Diagnosis not present

## 2015-03-26 DIAGNOSIS — S72002D Fracture of unspecified part of neck of left femur, subsequent encounter for closed fracture with routine healing: Secondary | ICD-10-CM | POA: Diagnosis not present

## 2015-03-26 DIAGNOSIS — E871 Hypo-osmolality and hyponatremia: Secondary | ICD-10-CM | POA: Diagnosis not present

## 2015-03-26 DIAGNOSIS — E119 Type 2 diabetes mellitus without complications: Secondary | ICD-10-CM | POA: Diagnosis not present

## 2015-03-26 DIAGNOSIS — K5900 Constipation, unspecified: Secondary | ICD-10-CM | POA: Diagnosis not present

## 2015-03-26 DIAGNOSIS — S32000S Wedge compression fracture of unspecified lumbar vertebra, sequela: Secondary | ICD-10-CM | POA: Diagnosis not present

## 2015-03-26 DIAGNOSIS — W541XXA Struck by dog, initial encounter: Secondary | ICD-10-CM | POA: Diagnosis not present

## 2015-03-26 DIAGNOSIS — S72142A Displaced intertrochanteric fracture of left femur, initial encounter for closed fracture: Secondary | ICD-10-CM | POA: Diagnosis not present

## 2015-03-26 LAB — CBC
HEMATOCRIT: 28.9 % — AB (ref 36.0–46.0)
HEMOGLOBIN: 9.6 g/dL — AB (ref 12.0–15.0)
MCH: 31.1 pg (ref 26.0–34.0)
MCHC: 33.2 g/dL (ref 30.0–36.0)
MCV: 93.5 fL (ref 78.0–100.0)
Platelets: 132 10*3/uL — ABNORMAL LOW (ref 150–400)
RBC: 3.09 MIL/uL — ABNORMAL LOW (ref 3.87–5.11)
RDW: 13.4 % (ref 11.5–15.5)
WBC: 5.7 10*3/uL (ref 4.0–10.5)

## 2015-03-26 LAB — BASIC METABOLIC PANEL
ANION GAP: 7 (ref 5–15)
BUN: 17 mg/dL (ref 6–20)
CALCIUM: 8.4 mg/dL — AB (ref 8.9–10.3)
CHLORIDE: 101 mmol/L (ref 101–111)
CO2: 26 mmol/L (ref 22–32)
Creatinine, Ser: 0.43 mg/dL — ABNORMAL LOW (ref 0.44–1.00)
GFR calc non Af Amer: >60 mL/min (ref >60)
GLUCOSE: 101 mg/dL — AB (ref 65–99)
Potassium: 3.7 mmol/L (ref 3.5–5.1)
Sodium: 134 mmol/L — ABNORMAL LOW (ref 135–145)

## 2015-03-26 MED ORDER — ENOXAPARIN SODIUM 30 MG/0.3ML ~~LOC~~ SOLN: 30.0000 mg | SUBCUTANEOUS | Status: DC

## 2015-03-26 MED ORDER — TRAZODONE HCL 50 MG PO TABS: 25.0000 mg | ORAL_TABLET | Freq: Every evening | ORAL | Status: DC | PRN

## 2015-03-26 MED ORDER — HYDROCODONE-ACETAMINOPHEN 5-325 MG PO TABS: 1.0000 | ORAL_TABLET | Freq: Four times a day (QID) | ORAL | Status: DC | PRN

## 2015-03-26 NOTE — Discharge Planning (Signed)
Patient will discharge today per MD order. Patient will discharge to: [facility] RN to call report prior to transportation to: [phone] Room 309 Transportation: [facility] scheduled for 1pm (per SNF request)  CSW sent discharge summary to SNF for review.  Packet is complete.  RN, patient and family aware of discharge plans.  Nonnie Done, [place] [phone]  Psychiatric & Orthopedics (5N 1-16) Clinical Social Worker

## 2015-03-26 NOTE — Progress Notes (Signed)
   Subjective:  Patient reports pain as mild to moderate. Denies N/V/Cp/SOB.  Objective:   VITALS:   Filed Vitals:   03/25/15 1500 03/25/15 1516 03/25/15 2001 03/26/15 0601  BP: 121/41 121/41 121/47 123/51  Pulse: 66 77 78 80  Temp: 98.1 F (36.7 C)  98.8 F (37.1 C) 99 F (37.2 C)  TempSrc:    Oral  Resp: 16  16 16   Height:      Weight:      SpO2: 97% 92% 97% 100%    ABD soft Sensation intact distally Intact pulses distally Dorsiflexion/Plantar flexion intact Incision: dressing C/D/I Compartment soft   Lab Results  Component Value Date   WBC 5.7 03/26/2015   HGB 9.6* 03/26/2015   HCT 28.9* 03/26/2015   MCV 93.5 03/26/2015   PLT 132* 03/26/2015   BMET    Component Value Date/Time   NA 134* 03/26/2015 0336   K 3.7 03/26/2015 0336   CL 101 03/26/2015 0336   CO2 26 03/26/2015 0336   GLUCOSE 101* 03/26/2015 0336   BUN 17 03/26/2015 0336   CREATININE 0.43* 03/26/2015 0336   CALCIUM 8.4* 03/26/2015 0336   GFRNONAA >60 03/26/2015 0336   GFRAA >60 03/26/2015 0336     Assessment/Plan: 2 Days Post-Op   Principal Problem:   Intertrochanteric fracture of left hip Active Problems:   Hyponatremia   Hyperglycemia   Hip fx   Pertrochanteric fracture of left femur   30% WB LLE with walker lovenox x30 days for DVT ppx PT/OT PO pain control D/C planning   [place], Horald Pollen 03/26/2015, 7:42 AM   Rod Can, MD Cell [phone]

## 2015-03-26 NOTE — Care Management Important Message (Signed)
Important Message  Patient Details  Name: Krystal [name] MRN: [id number] Date of Birth: 05-29-1932   Medicare Important Message Given:  Yes-second notification given    Delorse Lek 03/26/2015, 11:56 AM

## 2015-03-26 NOTE — Progress Notes (Signed)
Physical Therapy Treatment Patient Details Name: Krystal [name] MRN: [id number] DOB: Jul 21, 1931 Today's Date: 03/26/2015    History of Present Illness Krystal [name] is a 81 y.o. female past medical history of osteo-porosis, arthritis chronic back pain presents emergency department with the chief complaint of left hip pain after falling when a dog jumped on her. Initial evaluation reveals intertrochanteric fracture proximal left femur. Underwent IM nailing    PT Comments    Krystal [name] is making progress w/ therapy and ambulated 8 ft in room while adhering to [facility] status.  She is limited by pain in her Lt hip and fatigue.  She is anticipating d/c to SNF today.   Follow Up Recommendations  SNF;Supervision/Assistance - 24 hour     Equipment Recommendations  Rolling walker with 5" wheels    Recommendations for Other Services OT consult     Precautions / Restrictions Precautions Precautions: Fall Precaution Comments: pt reports having balance issues at baseline and has macular degeneration as well as osteoporosis Restrictions Weight Bearing Restrictions: Yes LLE Weight Bearing: Partial weight bearing LLE Partial Weight Bearing Percentage or Pounds: 30    Mobility  Bed Mobility Overal bed mobility: Needs Assistance Bed Mobility: Supine to Sit     Supine to sit: Min assist     General bed mobility comments: Min assist managing Lt LE to EOB.  HOB elevated but no use of bed rail.  Increased time 2/2 pain in Lt hip  Transfers Overall transfer level: Needs assistance Equipment used: Rolling walker (2 wheeled) Transfers: Sit to/from Stand Sit to Stand: Min assist;From elevated surface         General transfer comment: Min assist to stabilize RW while pt powers to standing from elevated bed.Cues for technique.  Ambulation/Gait Ambulation/Gait assistance: Min guard Ambulation Distance (Feet): 8 Feet Assistive device: Rolling walker (2 wheeled) Gait Pattern/deviations: Step-to  pattern;Shuffle;Antalgic;Trunk flexed;Decreased weight shift to left;Decreased stride length   Gait velocity interpretation: <1.8 ft/sec, indicative of risk for recurrent falls General Gait Details: Pt adheres to PWB status by increasing weight placed through Bil UEs.  Shuffle at time but responds well to verbal cues to pick foot up off floor.     Stairs            Wheelchair Mobility    Modified Rankin (Stroke Patients Only)       Balance Overall balance assessment: Needs assistance Sitting-balance support: Bilateral upper extremity supported;Feet supported Sitting balance-Leahy Scale: Good     Standing balance support: Bilateral upper extremity supported;During functional activity Standing balance-Leahy Scale: Poor Standing balance comment: Relies on RW for support                    Cognition Arousal/Alertness: Awake/alert Behavior During Therapy: WFL for tasks assessed/performed Overall Cognitive Status: Within Functional Limits for tasks assessed                      Exercises General Exercises - Lower Extremity Ankle Circles/Pumps: AROM;Both;10 reps;Supine Quad Sets: Strengthening;Both;10 reps;Supine Gluteal Sets: Strengthening;Both;10 reps;Supine    General Comments        Pertinent Vitals/Pain Pain Assessment: 0-10 Pain Score: 8  Pain Location: Lt hip Pain Descriptors / Indicators: Aching;Constant;Moaning Pain Intervention(s): Limited activity within patient's tolerance;Monitored during session;Repositioned;Premedicated before session    Home Living Family/patient expects to be discharged to:: Other (Comment) ([facility] ) Living Arrangements: Alone  Prior Function            PT Goals (current goals can now be found in the care plan section) Acute Rehab PT Goals Patient Stated Goal: go to rehab today PT Goal Formulation: With patient Time For Goal Achievement: 04/01/15 Potential to Achieve Goals:  Good Progress towards PT goals: Progressing toward goals    Frequency  Min 4X/week    PT Plan Current plan remains appropriate    Co-evaluation             End of Session Equipment Utilized During Treatment: Gait belt Activity Tolerance: Patient limited by pain;Patient limited by fatigue Patient left: in chair;with call bell/phone within reach;with nursing/sitter in room     Time: 0141-0301 PT Time Calculation (min) (ACUTE ONLY): 24 min  Charges:  $Gait Training: 8-22 mins $Therapeutic Exercise: 8-22 mins                    G Codes:      Joslyn Hy PT, [place] [phone] Pager: [phone] 03/26/2015, 12:29 PM

## 2015-03-26 NOTE — Discharge Summary (Signed)
Physician Discharge Summary  MERIEM LEMIEUX DDU:[id number] DOB: 03/23/1932 DOA: 03/23/2015  PCP: Redge Gainer, MD  Admit date: 03/23/2015 Discharge date: 03/26/2015  Time spent: 35 minutes  Recommendations for Outpatient Follow-up:  1. Recommend 30 days of Lovenox for DVT prophylaxis post hip surgery ending 04/26/15 approximately 2. 30% weightbearing as tolerated on left hip and follow-up with orthopedics as needed 3. Needs pain management with Vicodin/Percocet and scripts prescribed for the same for hip fracture 4. Recommend complete blood count within a week 5. Recommend checking vitamin D levels probably in about one month and supplement as appropriate  6. Patient will be discharged to skilled nursing facility [facility] for therapy services  Discharge Diagnoses:  Principal Problem:   Intertrochanteric fracture of left hip Active Problems:   Hyponatremia   Hyperglycemia   Hip fx   Pertrochanteric fracture of left femur   Discharge Condition: Fair hyperglycemia Diet recommendation: Diabetic heart healthy  Filed Weights   03/23/15 1133 03/24/15 1633  Weight: 49.896 kg (110 lb) 49.896 kg (110 lb)    History of present illness:  Krystal [name] is a 81 y.o. female with osteoporosis  and arthritis presented to the hospital with left hip pain after a fall wherein she was bumped by a dog in her backyard fell down and broke her hip and was found to have a left intertrochanteric fracture of the femur 03/23/15 She was transferred from [facility] to [facility] and was seen by Dr. Wiliam Ke of orthopedics  Underwent repair 03/24/15  Hospital Course:  Assessment/Plan: Principal Problem:  Intertrochanteric fracture of left hip -Management per orthopedic surgery -30% WBAT -Lovenox for DVT prophylaxis  -scripts have been written for pain medications on discharge   Active Problems:  Hyponatremia/hypokalemia -Improved with IV fluids-she states that she has not  eaten much in the past 3 days -saline lock iv 9/28  Expected blood loss anemai from surgery/Hemodilution -Hemoglobin on admission was around 12.6 and dropped to 9.6 -She will need recheck of blood counts and suggest supplementation with iron at around hemoglobin of 8   Hyperglycemia/ diabetes mellitus -A1c 6.1- have recommended dietary changes- nutrition consult requested   moderate malnutrition , BMI 15   recommend shakes at the facility if he should contact our   Consultations: Orthopedics  Alert and oriented no rge Exam: Filed Vitals:   03/26/15 0601  BP: 123/51  Pulse: 80  Temp: 99 F (37.2 C)  Resp: 16    General: Alert oriented no apparent distress Cardiovascular-S1-S2 no murmur rub or gallop  Respiratory-Clinically clear no added sound  Discharge Instructions   Discharge Instructions    Diet - low sodium heart healthy    Complete by:  As directed      Increase activity slowly    Complete by:  As directed           Current Discharge Medication List    START taking these medications   Details  enoxaparin (LOVENOX) 30 MG/0.3ML injection Inject 0.3 mLs (30 mg total) into the skin daily. Qty: 30 Syringe, Refills: 0    HYDROcodone-acetaminophen (NORCO/VICODIN) 5-325 MG tablet Take 1-2 tablets by mouth every 6 (six) hours as needed for moderate pain. Qty: 90 tablet, Refills: 0    traZODone (DESYREL) 50 MG tablet Take 0.5 tablets (25 mg total) by mouth at bedtime as needed for sleep. Qty: 30 tablet, Refills: 0      CONTINUE these medications which have NOT CHANGED   Details  acetaminophen (TYLENOL)  500 MG tablet Take 1,000 mg by mouth every 6 (six) hours as needed. For pain.    AMINO ACIDS PO Take 1 tablet by mouth daily.    cholecalciferol (VITAMIN D) 1000 UNITS tablet Take 2,000 Units by mouth daily.    dorzolamide-timolol (COSOPT) 22.3-6.8 MG/ML ophthalmic solution Place 1 drop into the right eye 2 (two) times daily.    Multiple Vitamin  (MULITIVITAMIN WITH MINERALS) TABS Take 1 tablet by mouth daily.    omega-3 acid ethyl esters (LOVAZA) 1 G capsule Take 1 g by mouth daily.    TRAVATAN Z 0.004 % SOLN ophthalmic solution Place 1 drop into the right eye at bedtime.  Refills: 10       No Known Allergies Follow-up Information    Follow up with Swinteck, Horald Pollen, MD. Schedule an appointment as soon as possible for a visit in 2 weeks.   Specialty:  Orthopedic Surgery   Why:  For wound re-check   Contact information:   [address] [address] [phone]        The results of significant diagnostics from this hospitalization (including imaging, microbiology, ancillary and laboratory) are listed below for reference.    Significant Diagnostic Studies: Pelvis Portable  03/24/2015   CLINICAL DATA:  Postop check  EXAM: PORTABLE PELVIS 1-2 VIEWS  COMPARISON:  03/24/2015  FINDINGS: Status post open reduction internal fixation of an intertrochanteric left femur fracture, with more complete visualization on dedicated femur imaging. No evidence of periprosthetic postoperative fracture.  Both hips are located. No evidence of pelvic ring fracture or diastasis, with limitation due to leftward rotation.  IMPRESSION: Intertrochanteric left femur fracture ORIF.  No acute finding.   Electronically Signed   By: Monte Fantasia M.D.   On: 03/24/2015 20:59   Ct Hip Left Wo Contrast  03/23/2015   CLINICAL DATA:  Pt reports dog jumped up on her and pushed her down, landing on left hip. Pt c/o hip pain, no deformity or weakness.  EXAM: CT OF THE LEFT HIP WITHOUT CONTRAST  TECHNIQUE: Multidetector CT imaging of the left hip was performed according to the standard protocol. Multiplanar CT image reconstructions were also generated.  COMPARISON:  None.  FINDINGS: There is a mildly comminuted left femoral basicervical fracture with an intertrochanteric component and a fracture cleft extending into the proximal femoral  diaphysis. There is no dislocation. There is no lytic or sclerotic osseous lesion. There is severe osteopenia. There is mild osteoarthritis of the left hip.  There is sequela of prior avulsive injury of the left hamstring origin.  There is fat stranding anterior to the rectus femoris muscle likely representing a small amount of hemorrhage. There is no focal fluid collection or hematoma.  IMPRESSION: 1. Mildly comminuted left femoral basicervical fracture with an intertrochanteric component and a fracture cleft extending into the proximal femoral diaphysis along the medial aspect. There is no dislocation.   Electronically Signed   By: Kathreen Devoid   On: 03/23/2015 14:33   Dg C-arm 1-60 Min  03/24/2015   CLINICAL DATA:  ORIF left femur fracture  EXAM: LEFT FEMUR 2 VIEWS; DG C-ARM 61-120 MIN  COMPARISON:  None  FLUOROSCOPY TIME:  2 minutes 4 seconds  Four images  FINDINGS: Interval placement of a left femoral IM nail with an interlocking cannulated femoral neck screw transfixing a comminuted left intertrochanteric fracture in near anatomic alignment. No dislocation.  IMPRESSION: ORIF left intertrochanteric fracture.   Electronically Signed   By: Elbert Ewings  Patel   On: 03/24/2015 18:44   Dg Hip Unilat With Pelvis 2-3 Views Left  03/23/2015   CLINICAL DATA:  Left hip pain  EXAM: DG HIP (WITH OR WITHOUT PELVIS) 2-3V LEFT  COMPARISON:  None  FINDINGS: Mild to moderate bilateral hip osteoarthritis. There is moderate diffuse osteopenia. Suspicious lucency extending through the intertrochanteric portion of the proximal left femur is identified. No dislocation.  IMPRESSION: 1. Examination is suspicious for intertrochanteric fracture the proximal left femur. Suggests confirmatory imaging with cross-sectional exam either MRI or CT. Consider further evaluation with cross-sectional imaging. The study of choice would be a MRI.   Electronically Signed   By: Kerby Moors M.D.   On: 03/23/2015 13:03   Dg Femur Min 2 Views  Left  03/24/2015   CLINICAL DATA:  ORIF left femur fracture  EXAM: LEFT FEMUR 2 VIEWS  COMPARISON:  None.  FINDINGS: Left femoral IM nail with interlocking femoral neck cannulated screw transfixing an intertrochanteric fracture with approximately 5 mm of distraction. No dislocation. Postsurgical changes in the surrounding soft tissues.  IMPRESSION: ORIF left femoral intertrochanteric fracture.   Electronically Signed   By: Kathreen Devoid   On: 03/24/2015 20:57   Dg Femur Min 2 Views Left  03/24/2015   CLINICAL DATA:  ORIF left femur fracture  EXAM: LEFT FEMUR 2 VIEWS; DG C-ARM 61-120 MIN  COMPARISON:  None  FLUOROSCOPY TIME:  2 minutes 4 seconds  Four images  FINDINGS: Interval placement of a left femoral IM nail with an interlocking cannulated femoral neck screw transfixing a comminuted left intertrochanteric fracture in near anatomic alignment. No dislocation.  IMPRESSION: ORIF left intertrochanteric fracture.   Electronically Signed   By: Kathreen Devoid   On: 03/24/2015 18:44   Dg Femur Min 2 Views Left  03/24/2015   CLINICAL DATA:  initial encounter left proximal intertrochanteric femur fracture  EXAM: LEFT FEMUR 2 VIEWS  COMPARISON:  None.  FINDINGS: There is an intertrochanteric left femur fracture which is mildly comminuted and mildly angulated with mild Barrett's angulation.  IMPRESSION: Proximal left femur fracture   Electronically Signed   By: Skipper Cliche M.D.   On: 03/24/2015 14:19    Microbiology: Recent Results (from the past 240 hour(s))  Surgical pcr screen     Status: None   Collection Time: 03/24/15  6:40 AM  Result Value Ref Range Status   MRSA, PCR NEGATIVE NEGATIVE Final   Staphylococcus aureus NEGATIVE NEGATIVE Final    Comment:        The Xpert SA Assay (FDA approved for NASAL specimens in patients over 65 years of age), is one component of a comprehensive surveillance program.  Test performance has been validated by [facility] for patients greater than or equal to 37  year old. It is not intended to diagnose infection nor to guide or monitor treatment.      Labs: Basic Metabolic Panel:  Recent Labs Lab 03/23/15 1300 03/24/15 0454 03/25/15 0610 03/26/15 0336  NA 132* 135 132* 134*  K 3.5 4.0 3.4* 3.7  CL 96* 97* 100* 101  CO2 29 30 25 26   GLUCOSE 122* 142* 172* 101*  BUN 18 19 16 17   CREATININE 0.51 0.61 0.48 0.43*  CALCIUM 8.8* 9.3 8.1* 8.4*   Liver Function Tests: No results for input(s): AST, ALT, ALKPHOS, BILITOT, PROT, ALBUMIN in the last 168 hours. No results for input(s): LIPASE, AMYLASE in the last 168 hours. No results for input(s): AMMONIA in the last 168 hours.  CBC:  Recent Labs Lab 03/23/15 1300 03/24/15 0454 03/25/15 0610 03/26/15 0336  WBC 8.8 7.2 6.5 5.7  NEUTROABS 7.5  --   --   --   HGB 13.5 12.6 10.2* 9.6*  HCT 39.1 37.0 31.0* 28.9*  MCV 92.7 93.2 93.4 93.5  PLT 194 159 136* 132*   Cardiac Enzymes: No results for input(s): CKTOTAL, CKMB, CKMBINDEX, TROPONINI in the last 168 hours. BNP: BNP (last 3 results) No results for input(s): BNP in the last 8760 hours.  ProBNP (last 3 results) No results for input(s): PROBNP in the last 8760 hours.  CBG:  Recent Labs Lab 03/24/15 1605  GLUCAP 89       Signed:  Nita Sells  [facility] 03/26/2015, 8:33 AM

## 2015-03-26 NOTE — Progress Notes (Addendum)
Called report to [place], Therapist, sports at [facility].  Patient will be transported via [facility] today.  Patient is aware of transportation method and awaiting transport at this time.

## 2015-03-27 DIAGNOSIS — E119 Type 2 diabetes mellitus without complications: Secondary | ICD-10-CM | POA: Diagnosis not present

## 2015-03-27 DIAGNOSIS — S72002A Fracture of unspecified part of neck of left femur, initial encounter for closed fracture: Secondary | ICD-10-CM | POA: Diagnosis not present

## 2015-03-27 DIAGNOSIS — D649 Anemia, unspecified: Secondary | ICD-10-CM | POA: Diagnosis not present

## 2015-03-27 DIAGNOSIS — M1990 Unspecified osteoarthritis, unspecified site: Secondary | ICD-10-CM | POA: Diagnosis not present

## 2015-03-27 DIAGNOSIS — H409 Unspecified glaucoma: Secondary | ICD-10-CM | POA: Diagnosis not present

## 2015-03-30 DIAGNOSIS — M1990 Unspecified osteoarthritis, unspecified site: Secondary | ICD-10-CM | POA: Diagnosis not present

## 2015-03-30 DIAGNOSIS — M810 Age-related osteoporosis without current pathological fracture: Secondary | ICD-10-CM | POA: Diagnosis not present

## 2015-03-30 DIAGNOSIS — S72002D Fracture of unspecified part of neck of left femur, subsequent encounter for closed fracture with routine healing: Secondary | ICD-10-CM | POA: Diagnosis not present

## 2015-03-30 DIAGNOSIS — R739 Hyperglycemia, unspecified: Secondary | ICD-10-CM | POA: Diagnosis not present

## 2015-03-31 DIAGNOSIS — M810 Age-related osteoporosis without current pathological fracture: Secondary | ICD-10-CM | POA: Diagnosis not present

## 2015-03-31 DIAGNOSIS — R739 Hyperglycemia, unspecified: Secondary | ICD-10-CM | POA: Diagnosis not present

## 2015-03-31 DIAGNOSIS — S72002D Fracture of unspecified part of neck of left femur, subsequent encounter for closed fracture with routine healing: Secondary | ICD-10-CM | POA: Diagnosis not present

## 2015-03-31 DIAGNOSIS — M1990 Unspecified osteoarthritis, unspecified site: Secondary | ICD-10-CM | POA: Diagnosis not present

## 2015-04-06 DIAGNOSIS — M1990 Unspecified osteoarthritis, unspecified site: Secondary | ICD-10-CM | POA: Diagnosis not present

## 2015-04-06 DIAGNOSIS — S72002D Fracture of unspecified part of neck of left femur, subsequent encounter for closed fracture with routine healing: Secondary | ICD-10-CM | POA: Diagnosis not present

## 2015-04-06 DIAGNOSIS — K5900 Constipation, unspecified: Secondary | ICD-10-CM | POA: Diagnosis not present

## 2015-04-08 DIAGNOSIS — Z96642 Presence of left artificial hip joint: Secondary | ICD-10-CM | POA: Diagnosis not present

## 2015-04-08 DIAGNOSIS — S72145D Nondisplaced intertrochanteric fracture of left femur, subsequent encounter for closed fracture with routine healing: Secondary | ICD-10-CM | POA: Diagnosis not present

## 2015-04-09 DIAGNOSIS — S72002D Fracture of unspecified part of neck of left femur, subsequent encounter for closed fracture with routine healing: Secondary | ICD-10-CM | POA: Diagnosis not present

## 2015-04-09 DIAGNOSIS — R739 Hyperglycemia, unspecified: Secondary | ICD-10-CM | POA: Diagnosis not present

## 2015-04-09 DIAGNOSIS — M810 Age-related osteoporosis without current pathological fracture: Secondary | ICD-10-CM | POA: Diagnosis not present

## 2015-04-22 DIAGNOSIS — S72002D Fracture of unspecified part of neck of left femur, subsequent encounter for closed fracture with routine healing: Secondary | ICD-10-CM | POA: Diagnosis not present

## 2015-04-22 DIAGNOSIS — R739 Hyperglycemia, unspecified: Secondary | ICD-10-CM | POA: Diagnosis not present

## 2015-04-22 DIAGNOSIS — M1990 Unspecified osteoarthritis, unspecified site: Secondary | ICD-10-CM | POA: Diagnosis not present

## 2015-04-22 DIAGNOSIS — M810 Age-related osteoporosis without current pathological fracture: Secondary | ICD-10-CM | POA: Diagnosis not present

## 2015-05-06 DIAGNOSIS — S72145D Nondisplaced intertrochanteric fracture of left femur, subsequent encounter for closed fracture with routine healing: Secondary | ICD-10-CM | POA: Diagnosis not present

## 2015-05-19 ENCOUNTER — Encounter: Payer: Self-pay | Admitting: Internal Medicine

## 2015-05-19 ENCOUNTER — Non-Acute Institutional Stay: Payer: Medicare Other | Admitting: Internal Medicine

## 2015-05-19 DIAGNOSIS — H540 Blindness, both eyes: Secondary | ICD-10-CM

## 2015-05-19 DIAGNOSIS — S32000S Wedge compression fracture of unspecified lumbar vertebra, sequela: Secondary | ICD-10-CM

## 2015-05-19 DIAGNOSIS — H548 Legal blindness, as defined in USA: Secondary | ICD-10-CM | POA: Insufficient documentation

## 2015-05-19 DIAGNOSIS — E871 Hypo-osmolality and hyponatremia: Secondary | ICD-10-CM

## 2015-05-19 DIAGNOSIS — S72102D Unspecified trochanteric fracture of left femur, subsequent encounter for closed fracture with routine healing: Secondary | ICD-10-CM | POA: Diagnosis not present

## 2015-05-19 DIAGNOSIS — R739 Hyperglycemia, unspecified: Secondary | ICD-10-CM

## 2015-05-19 DIAGNOSIS — M810 Age-related osteoporosis without current pathological fracture: Secondary | ICD-10-CM

## 2015-05-19 DIAGNOSIS — H547 Unspecified visual loss: Secondary | ICD-10-CM

## 2015-05-19 NOTE — Progress Notes (Signed)
Patient ID: Krystal [name], female   DOB: 1932/05/19, 81 y.o.   MRN: [id number]  Provider:  Rexene Edison. Mariea Clonts, D.O., C.M.D. Location:  [facility] PCP: Lether Tesch, DO prior to admission, will now be followed by [facility]  Code Status: DNR Goals of Care: Advanced Directive information Does patient have an advance directive?: Yes, Type of Advance Directive: [place];Living will, Pre-existing out of facility DNR order (yellow form or pink MOST form): Pink MOST form placed in chart (order not valid for inpatient use);Yellow form placed in chart (order not valid for inpatient use), Does patient want to make changes to advanced directive?: No - Patient declined   Chief Complaint  Patient presents with  . New Evaluation    newly admitted to [facility] from [facility] due to legal blindness    HPI: Patient is a 81 y.o. female seen today for admission to [facility] from [facility].  She had initially been at [facility] for rehab s/p fall with left hip fracture.  She continues to work on her ambulation with her walker.  Her blindness is due to advanced macular degeneration and began around age 13.  She suspects that her sister had a similar condition.  She also has glaucoma.  She's had hyperglycemia and is quite educated about dietary changes to prevent this from progressing to diabetes.  She was not happy with the food at her previous facility.  Historically, she's also had hyponatremia and a lumbar compression fx from senile osteoporosis, on vitamin D.  She has some chronic difficulty with constipation and takes miralax for this.  She has osteoarthritis and uses tylenol for pain.  Review of Systems  Constitutional: Negative for fever and chills.  HENT: Negative for congestion and hearing loss.   Eyes: Negative for discharge.       Blind  Respiratory: Negative for shortness of breath.   Cardiovascular: Negative for chest pain and leg swelling.    Gastrointestinal: Positive for constipation. Negative for abdominal pain, blood in stool and melena.  Genitourinary: Positive for urgency. Negative for dysuria, frequency and hematuria.  Musculoskeletal: Positive for back pain, joint pain and falls.  Skin: Negative for rash.  Neurological: Negative for dizziness, loss of consciousness and headaches.  Endo/Heme/Allergies: Bruises/bleeds easily.  Psychiatric/Behavioral: Negative for depression and memory loss.    Past Medical History  Diagnosis Date  . Back pain   . Arthritis   . Osteoporosis   . Compression fracture of lumbar vertebra ([place]) 2013  . Borderline diabetes   . Anxiety   . GERD (gastroesophageal reflux disease)   . Macular degeneration     both eyes, blind  . Hypo-osmolality and hyponatremia   . Hyperglycemia   . Fracture of unspecified part of neck of unspecified femur, initial encounter for closed fracture ([place])   . Displaced intertrochanteric fracture of left femur [facility])    Past Surgical History  Procedure Laterality Date  . Tonsillectomy    . Intramedullary (im) nail intertrochanteric Left 03/24/2015    Procedure: INTRAMEDULLARY (IM) NAIL INTERTROCHANTRIC;  Surgeon: Rod Can, MD;  Location: [facility];  Service: Orthopedics;  Laterality: Left;  . Cataract extraction    . Hip fracture surgery Left 2016    reports that she quit smoking about 49 years ago. She has never used smokeless tobacco. She reports that she drinks alcohol. She reports that she does not use illicit drugs. Family History  Problem Relation Age of Onset  . Cancer Mother  breast  . Heart disease Father     No Known Allergies    Medication List       This list is accurate as of: 05/19/15 11:59 PM.  Always use your most recent med list.               acetaminophen 500 MG tablet  Commonly known as:  TYLENOL  Take 1,000 mg by mouth every 6 (six) hours as needed. For pain.     cholecalciferol 1000 UNITS tablet  Commonly known as:   VITAMIN D  Take 2,000 Units by mouth daily.     dorzolamide-timolol 22.3-6.8 MG/ML ophthalmic solution  Commonly known as:  COSOPT  Place 1 drop into the right eye 2 (two) times daily.     multivitamin with minerals Tabs tablet  Take 1 tablet by mouth daily.     omega-3 acid ethyl esters 1 G capsule  Commonly known as:  LOVAZA  Take 1 g by mouth daily.     polyethylene glycol packet  Commonly known as:  MIRALAX / GLYCOLAX  Take 17 g by mouth daily.     TRAVATAN Z 0.004 % Soln ophthalmic solution  Generic drug:  Travoprost (BAK Free)  Place 1 drop into the right eye at bedtime.        Physical Exam: Filed Vitals:   05/19/15 1635  BP: 147/71  Pulse: 65  Temp: 97.6 F (36.4 C)  TempSrc: Oral  Resp: 20  Height: 5\' 10"  (1.778 m)  Weight: 109 lb 3.2 oz (49.533 kg)  SpO2: 99%   Body mass index is 15.67 kg/(m^2). Physical Exam  Constitutional: She is oriented to person, place, and time. She appears well-developed and well-nourished. No distress.  HENT:  Head: Normocephalic and atraumatic.  Right Ear: External ear normal.  Left Ear: External ear normal.  Nose: Nose normal.  Mouth/Throat: Oropharynx is clear and moist. No oropharyngeal exudate.  Eyes: Conjunctivae are normal. Pupils are equal, round, and reactive to light.  blind  Neck: Normal range of motion. Neck supple. No JVD present. No tracheal deviation present. No thyromegaly present.  Cardiovascular: Normal rate, regular rhythm, normal heart sounds and intact distal pulses.   No murmur heard. Pulmonary/Chest: Effort normal and breath sounds normal. No respiratory distress.  Abdominal: Soft. Bowel sounds are normal. She exhibits no distension and no mass. There is no tenderness. There is no rebound and no guarding.  Musculoskeletal: Normal range of motion. She exhibits tenderness.  Left lateral thigh; some decreased ability to flex at hip on left vs.right hip  Lymphadenopathy:    She has no cervical adenopathy.   Neurological: She is alert and oriented to person, place, and time. She has normal reflexes. No cranial nerve deficit.  Skin: Skin is warm and dry. There is pallor.  Psychiatric: She has a normal mood and affect. Her behavior is normal. Judgment and thought content normal.    Labs reviewed: Basic Metabolic Panel:  Recent Labs  03/24/15 0454 03/25/15 0610 03/26/15 0336  NA 135 132* 134*  K 4.0 3.4* 3.7  CL 97* 100* 101  CO2 30 25 26   GLUCOSE 142* 172* 101*  BUN 19 16 17   CREATININE 0.61 0.48 0.43*  CALCIUM 9.3 8.1* 8.4*   Liver Function Tests: No results for input(s): AST, ALT, ALKPHOS, BILITOT, PROT, ALBUMIN in the last 8760 hours. No results for input(s): LIPASE, AMYLASE in the last 8760 hours. No results for input(s): AMMONIA in the last 8760 hours. CBC:  Recent  Labs  03/23/15 1300 03/24/15 0454 03/25/15 0610 03/26/15 0336  WBC 8.8 7.2 6.5 5.7  NEUTROABS 7.5  --   --   --   HGB 13.5 12.6 10.2* 9.6*  HCT 39.1 37.0 31.0* 28.9*  MCV 92.7 93.2 93.4 93.5  PLT 194 159 136* 132*   Cardiac Enzymes: No results for input(s): CKTOTAL, CKMB, CKMBINDEX, TROPONINI in the last 8760 hours. BNP: Invalid input(s): POCBNP Lab Results  Component Value Date   HGBA1C 6.1* 03/23/2015   No results found for: TSH No results found for: VITAMINB12 No results found for: FOLATE No results found for: IRON, TIBC, FERRITIN  Imaging and Procedures obtained prior to [facility] admission: 03/24/15:  xrays of left hip and pelvis reviewed  Assessment/Plan 1. Blindness -due to macular degeneration -she has been fairly independent prior to her hip fracture that landed her at [facility] -it will take her a bit to adjust to her new environment  2. Pertrochanteric fracture of left femur, closed, with routine healing, subsequent encounter -she is still making progress on this -walking with walker, but currently using wheelchair in her room -cont PT, OT here  3. Compression fracture of lumbar  vertebra, sequela -with some chronic back pain -cont tylenol  4. Senile osteoporosis -cont vitamin D -prior compression fx of lumbar spine -will need bone density study in near future to evaluation further   5. Hyponatremia -f/u cmp  6. Hyperglycemia -continue her diet, f/u hba1c due to elevated fasting glucose values  Functional status:  Needs assistance with meds and will need some ADL help otherwise at first due to her blindness until she learns her surroundings  Family/ staff Communication: discussed with AL nursing  Labs/tests ordered:  Cbc, cmp, hba1c, flp  60 minutes was spent with patient reviewing her history and performing a physical exam.  I also reviewed records from [facility] and those available from her hospitalization in [facility].  Mackinzie Vuncannon L. Callen Zuba, D.O. [facility] [address], [place] [address] Cell Phone (Mon-Fri 8am-5pm):  [phone] On Call:  [phone] & follow prompts after 5pm & weekends Office Phone:  [phone] Office Fax:  [phone]

## 2015-05-20 DIAGNOSIS — R278 Other lack of coordination: Secondary | ICD-10-CM | POA: Diagnosis not present

## 2015-05-20 DIAGNOSIS — M810 Age-related osteoporosis without current pathological fracture: Secondary | ICD-10-CM | POA: Diagnosis not present

## 2015-05-20 DIAGNOSIS — R2689 Other abnormalities of gait and mobility: Secondary | ICD-10-CM | POA: Diagnosis not present

## 2015-05-20 DIAGNOSIS — R488 Other symbolic dysfunctions: Secondary | ICD-10-CM | POA: Diagnosis not present

## 2015-05-20 DIAGNOSIS — Z4789 Encounter for other orthopedic aftercare: Secondary | ICD-10-CM | POA: Diagnosis not present

## 2015-05-20 DIAGNOSIS — M84452D Pathological fracture, left femur, subsequent encounter for fracture with routine healing: Secondary | ICD-10-CM | POA: Diagnosis not present

## 2015-05-20 DIAGNOSIS — H542 Low vision, both eyes: Secondary | ICD-10-CM | POA: Diagnosis not present

## 2015-05-20 DIAGNOSIS — G3184 Mild cognitive impairment, so stated: Secondary | ICD-10-CM | POA: Diagnosis not present

## 2015-05-20 DIAGNOSIS — Z9181 History of falling: Secondary | ICD-10-CM | POA: Diagnosis not present

## 2015-05-20 DIAGNOSIS — S72145D Nondisplaced intertrochanteric fracture of left femur, subsequent encounter for closed fracture with routine healing: Secondary | ICD-10-CM | POA: Diagnosis not present

## 2015-05-20 DIAGNOSIS — M6281 Muscle weakness (generalized): Secondary | ICD-10-CM | POA: Diagnosis not present

## 2015-05-20 DIAGNOSIS — M25552 Pain in left hip: Secondary | ICD-10-CM | POA: Diagnosis not present

## 2015-05-21 DIAGNOSIS — R2689 Other abnormalities of gait and mobility: Secondary | ICD-10-CM | POA: Diagnosis not present

## 2015-05-21 DIAGNOSIS — M25552 Pain in left hip: Secondary | ICD-10-CM | POA: Diagnosis not present

## 2015-05-21 DIAGNOSIS — M6281 Muscle weakness (generalized): Secondary | ICD-10-CM | POA: Diagnosis not present

## 2015-05-21 DIAGNOSIS — R278 Other lack of coordination: Secondary | ICD-10-CM | POA: Diagnosis not present

## 2015-05-21 DIAGNOSIS — H542 Low vision, both eyes: Secondary | ICD-10-CM | POA: Diagnosis not present

## 2015-05-21 DIAGNOSIS — R488 Other symbolic dysfunctions: Secondary | ICD-10-CM | POA: Diagnosis not present

## 2015-05-22 DIAGNOSIS — R488 Other symbolic dysfunctions: Secondary | ICD-10-CM | POA: Diagnosis not present

## 2015-05-22 DIAGNOSIS — R278 Other lack of coordination: Secondary | ICD-10-CM | POA: Diagnosis not present

## 2015-05-22 DIAGNOSIS — M6281 Muscle weakness (generalized): Secondary | ICD-10-CM | POA: Diagnosis not present

## 2015-05-22 DIAGNOSIS — H542 Low vision, both eyes: Secondary | ICD-10-CM | POA: Diagnosis not present

## 2015-05-22 DIAGNOSIS — R2689 Other abnormalities of gait and mobility: Secondary | ICD-10-CM | POA: Diagnosis not present

## 2015-05-22 DIAGNOSIS — M25552 Pain in left hip: Secondary | ICD-10-CM | POA: Diagnosis not present

## 2015-05-25 DIAGNOSIS — M25552 Pain in left hip: Secondary | ICD-10-CM | POA: Diagnosis not present

## 2015-05-25 DIAGNOSIS — R488 Other symbolic dysfunctions: Secondary | ICD-10-CM | POA: Diagnosis not present

## 2015-05-25 DIAGNOSIS — R278 Other lack of coordination: Secondary | ICD-10-CM | POA: Diagnosis not present

## 2015-05-25 DIAGNOSIS — H542 Low vision, both eyes: Secondary | ICD-10-CM | POA: Diagnosis not present

## 2015-05-25 DIAGNOSIS — M6281 Muscle weakness (generalized): Secondary | ICD-10-CM | POA: Diagnosis not present

## 2015-05-25 DIAGNOSIS — R2689 Other abnormalities of gait and mobility: Secondary | ICD-10-CM | POA: Diagnosis not present

## 2015-05-26 DIAGNOSIS — M6281 Muscle weakness (generalized): Secondary | ICD-10-CM | POA: Diagnosis not present

## 2015-05-26 DIAGNOSIS — R488 Other symbolic dysfunctions: Secondary | ICD-10-CM | POA: Diagnosis not present

## 2015-05-26 DIAGNOSIS — M25552 Pain in left hip: Secondary | ICD-10-CM | POA: Diagnosis not present

## 2015-05-26 DIAGNOSIS — H542 Low vision, both eyes: Secondary | ICD-10-CM | POA: Diagnosis not present

## 2015-05-26 DIAGNOSIS — R278 Other lack of coordination: Secondary | ICD-10-CM | POA: Diagnosis not present

## 2015-05-26 DIAGNOSIS — R2689 Other abnormalities of gait and mobility: Secondary | ICD-10-CM | POA: Diagnosis not present

## 2015-05-27 DIAGNOSIS — R2689 Other abnormalities of gait and mobility: Secondary | ICD-10-CM | POA: Diagnosis not present

## 2015-05-27 DIAGNOSIS — H542 Low vision, both eyes: Secondary | ICD-10-CM | POA: Diagnosis not present

## 2015-05-27 DIAGNOSIS — M6281 Muscle weakness (generalized): Secondary | ICD-10-CM | POA: Diagnosis not present

## 2015-05-27 DIAGNOSIS — M25552 Pain in left hip: Secondary | ICD-10-CM | POA: Diagnosis not present

## 2015-05-27 DIAGNOSIS — R278 Other lack of coordination: Secondary | ICD-10-CM | POA: Diagnosis not present

## 2015-05-27 DIAGNOSIS — R488 Other symbolic dysfunctions: Secondary | ICD-10-CM | POA: Diagnosis not present

## 2015-05-28 DIAGNOSIS — M810 Age-related osteoporosis without current pathological fracture: Secondary | ICD-10-CM | POA: Diagnosis not present

## 2015-05-28 DIAGNOSIS — H542 Low vision, both eyes: Secondary | ICD-10-CM | POA: Diagnosis not present

## 2015-05-28 DIAGNOSIS — M84452D Pathological fracture, left femur, subsequent encounter for fracture with routine healing: Secondary | ICD-10-CM | POA: Diagnosis not present

## 2015-05-28 DIAGNOSIS — M25552 Pain in left hip: Secondary | ICD-10-CM | POA: Diagnosis not present

## 2015-05-28 DIAGNOSIS — R2689 Other abnormalities of gait and mobility: Secondary | ICD-10-CM | POA: Diagnosis not present

## 2015-05-28 DIAGNOSIS — Z4789 Encounter for other orthopedic aftercare: Secondary | ICD-10-CM | POA: Diagnosis not present

## 2015-05-28 DIAGNOSIS — R278 Other lack of coordination: Secondary | ICD-10-CM | POA: Diagnosis not present

## 2015-05-28 DIAGNOSIS — S72145D Nondisplaced intertrochanteric fracture of left femur, subsequent encounter for closed fracture with routine healing: Secondary | ICD-10-CM | POA: Diagnosis not present

## 2015-05-28 DIAGNOSIS — Z9181 History of falling: Secondary | ICD-10-CM | POA: Diagnosis not present

## 2015-05-28 DIAGNOSIS — M6281 Muscle weakness (generalized): Secondary | ICD-10-CM | POA: Diagnosis not present

## 2015-05-29 DIAGNOSIS — R278 Other lack of coordination: Secondary | ICD-10-CM | POA: Diagnosis not present

## 2015-05-29 DIAGNOSIS — R2689 Other abnormalities of gait and mobility: Secondary | ICD-10-CM | POA: Diagnosis not present

## 2015-05-29 DIAGNOSIS — M6281 Muscle weakness (generalized): Secondary | ICD-10-CM | POA: Diagnosis not present

## 2015-05-29 DIAGNOSIS — M25552 Pain in left hip: Secondary | ICD-10-CM | POA: Diagnosis not present

## 2015-05-29 DIAGNOSIS — H542 Low vision, both eyes: Secondary | ICD-10-CM | POA: Diagnosis not present

## 2015-05-29 DIAGNOSIS — M810 Age-related osteoporosis without current pathological fracture: Secondary | ICD-10-CM | POA: Diagnosis not present

## 2015-06-01 ENCOUNTER — Encounter: Payer: Self-pay | Admitting: Internal Medicine

## 2015-06-01 DIAGNOSIS — M810 Age-related osteoporosis without current pathological fracture: Secondary | ICD-10-CM | POA: Diagnosis not present

## 2015-06-01 DIAGNOSIS — M25552 Pain in left hip: Secondary | ICD-10-CM | POA: Diagnosis not present

## 2015-06-01 DIAGNOSIS — R2689 Other abnormalities of gait and mobility: Secondary | ICD-10-CM | POA: Diagnosis not present

## 2015-06-01 DIAGNOSIS — M6281 Muscle weakness (generalized): Secondary | ICD-10-CM | POA: Diagnosis not present

## 2015-06-01 DIAGNOSIS — H542 Low vision, both eyes: Secondary | ICD-10-CM | POA: Diagnosis not present

## 2015-06-01 DIAGNOSIS — R278 Other lack of coordination: Secondary | ICD-10-CM | POA: Diagnosis not present

## 2015-06-02 DIAGNOSIS — M25552 Pain in left hip: Secondary | ICD-10-CM | POA: Diagnosis not present

## 2015-06-02 DIAGNOSIS — M6281 Muscle weakness (generalized): Secondary | ICD-10-CM | POA: Diagnosis not present

## 2015-06-02 DIAGNOSIS — R2689 Other abnormalities of gait and mobility: Secondary | ICD-10-CM | POA: Diagnosis not present

## 2015-06-02 DIAGNOSIS — R278 Other lack of coordination: Secondary | ICD-10-CM | POA: Diagnosis not present

## 2015-06-02 DIAGNOSIS — H542 Low vision, both eyes: Secondary | ICD-10-CM | POA: Diagnosis not present

## 2015-06-02 DIAGNOSIS — M810 Age-related osteoporosis without current pathological fracture: Secondary | ICD-10-CM | POA: Diagnosis not present

## 2015-06-03 DIAGNOSIS — R2689 Other abnormalities of gait and mobility: Secondary | ICD-10-CM | POA: Diagnosis not present

## 2015-06-03 DIAGNOSIS — M25552 Pain in left hip: Secondary | ICD-10-CM | POA: Diagnosis not present

## 2015-06-03 DIAGNOSIS — M6281 Muscle weakness (generalized): Secondary | ICD-10-CM | POA: Diagnosis not present

## 2015-06-03 DIAGNOSIS — H542 Low vision, both eyes: Secondary | ICD-10-CM | POA: Diagnosis not present

## 2015-06-03 DIAGNOSIS — M810 Age-related osteoporosis without current pathological fracture: Secondary | ICD-10-CM | POA: Diagnosis not present

## 2015-06-03 DIAGNOSIS — R278 Other lack of coordination: Secondary | ICD-10-CM | POA: Diagnosis not present

## 2015-06-04 DIAGNOSIS — M6281 Muscle weakness (generalized): Secondary | ICD-10-CM | POA: Diagnosis not present

## 2015-06-04 DIAGNOSIS — Z961 Presence of intraocular lens: Secondary | ICD-10-CM | POA: Diagnosis not present

## 2015-06-04 DIAGNOSIS — H3322 Serous retinal detachment, left eye: Secondary | ICD-10-CM | POA: Insufficient documentation

## 2015-06-04 DIAGNOSIS — H401114 Primary open-angle glaucoma, right eye, indeterminate stage: Secondary | ICD-10-CM | POA: Diagnosis not present

## 2015-06-04 DIAGNOSIS — R278 Other lack of coordination: Secondary | ICD-10-CM | POA: Diagnosis not present

## 2015-06-04 DIAGNOSIS — H353211 Exudative age-related macular degeneration, right eye, with active choroidal neovascularization: Secondary | ICD-10-CM | POA: Diagnosis not present

## 2015-06-04 DIAGNOSIS — M810 Age-related osteoporosis without current pathological fracture: Secondary | ICD-10-CM | POA: Diagnosis not present

## 2015-06-04 DIAGNOSIS — H31012 Macula scars of posterior pole (postinflammatory) (post-traumatic), left eye: Secondary | ICD-10-CM | POA: Diagnosis not present

## 2015-06-04 DIAGNOSIS — H542 Low vision, both eyes: Secondary | ICD-10-CM | POA: Diagnosis not present

## 2015-06-04 DIAGNOSIS — R2689 Other abnormalities of gait and mobility: Secondary | ICD-10-CM | POA: Diagnosis not present

## 2015-06-04 DIAGNOSIS — M25552 Pain in left hip: Secondary | ICD-10-CM | POA: Diagnosis not present

## 2015-06-05 DIAGNOSIS — R2689 Other abnormalities of gait and mobility: Secondary | ICD-10-CM | POA: Diagnosis not present

## 2015-06-05 DIAGNOSIS — H542 Low vision, both eyes: Secondary | ICD-10-CM | POA: Diagnosis not present

## 2015-06-05 DIAGNOSIS — M6281 Muscle weakness (generalized): Secondary | ICD-10-CM | POA: Diagnosis not present

## 2015-06-05 DIAGNOSIS — M810 Age-related osteoporosis without current pathological fracture: Secondary | ICD-10-CM | POA: Diagnosis not present

## 2015-06-05 DIAGNOSIS — M25552 Pain in left hip: Secondary | ICD-10-CM | POA: Diagnosis not present

## 2015-06-05 DIAGNOSIS — R278 Other lack of coordination: Secondary | ICD-10-CM | POA: Diagnosis not present

## 2015-06-08 DIAGNOSIS — R278 Other lack of coordination: Secondary | ICD-10-CM | POA: Diagnosis not present

## 2015-06-08 DIAGNOSIS — M6281 Muscle weakness (generalized): Secondary | ICD-10-CM | POA: Diagnosis not present

## 2015-06-08 DIAGNOSIS — R2689 Other abnormalities of gait and mobility: Secondary | ICD-10-CM | POA: Diagnosis not present

## 2015-06-08 DIAGNOSIS — H542 Low vision, both eyes: Secondary | ICD-10-CM | POA: Diagnosis not present

## 2015-06-08 DIAGNOSIS — M810 Age-related osteoporosis without current pathological fracture: Secondary | ICD-10-CM | POA: Diagnosis not present

## 2015-06-08 DIAGNOSIS — M25552 Pain in left hip: Secondary | ICD-10-CM | POA: Diagnosis not present

## 2015-06-09 DIAGNOSIS — R278 Other lack of coordination: Secondary | ICD-10-CM | POA: Diagnosis not present

## 2015-06-09 DIAGNOSIS — M6281 Muscle weakness (generalized): Secondary | ICD-10-CM | POA: Diagnosis not present

## 2015-06-09 DIAGNOSIS — R2689 Other abnormalities of gait and mobility: Secondary | ICD-10-CM | POA: Diagnosis not present

## 2015-06-09 DIAGNOSIS — M810 Age-related osteoporosis without current pathological fracture: Secondary | ICD-10-CM | POA: Diagnosis not present

## 2015-06-09 DIAGNOSIS — H542 Low vision, both eyes: Secondary | ICD-10-CM | POA: Diagnosis not present

## 2015-06-09 DIAGNOSIS — M25552 Pain in left hip: Secondary | ICD-10-CM | POA: Diagnosis not present

## 2015-06-10 DIAGNOSIS — M25552 Pain in left hip: Secondary | ICD-10-CM | POA: Diagnosis not present

## 2015-06-10 DIAGNOSIS — H542 Low vision, both eyes: Secondary | ICD-10-CM | POA: Diagnosis not present

## 2015-06-10 DIAGNOSIS — R2689 Other abnormalities of gait and mobility: Secondary | ICD-10-CM | POA: Diagnosis not present

## 2015-06-10 DIAGNOSIS — M810 Age-related osteoporosis without current pathological fracture: Secondary | ICD-10-CM | POA: Diagnosis not present

## 2015-06-10 DIAGNOSIS — R278 Other lack of coordination: Secondary | ICD-10-CM | POA: Diagnosis not present

## 2015-06-10 DIAGNOSIS — M6281 Muscle weakness (generalized): Secondary | ICD-10-CM | POA: Diagnosis not present

## 2015-06-11 DIAGNOSIS — M25552 Pain in left hip: Secondary | ICD-10-CM | POA: Diagnosis not present

## 2015-06-11 DIAGNOSIS — M6281 Muscle weakness (generalized): Secondary | ICD-10-CM | POA: Diagnosis not present

## 2015-06-11 DIAGNOSIS — M810 Age-related osteoporosis without current pathological fracture: Secondary | ICD-10-CM | POA: Diagnosis not present

## 2015-06-11 DIAGNOSIS — R278 Other lack of coordination: Secondary | ICD-10-CM | POA: Diagnosis not present

## 2015-06-11 DIAGNOSIS — R2689 Other abnormalities of gait and mobility: Secondary | ICD-10-CM | POA: Diagnosis not present

## 2015-06-11 DIAGNOSIS — H542 Low vision, both eyes: Secondary | ICD-10-CM | POA: Diagnosis not present

## 2015-06-12 DIAGNOSIS — R2689 Other abnormalities of gait and mobility: Secondary | ICD-10-CM | POA: Diagnosis not present

## 2015-06-12 DIAGNOSIS — M25552 Pain in left hip: Secondary | ICD-10-CM | POA: Diagnosis not present

## 2015-06-12 DIAGNOSIS — R278 Other lack of coordination: Secondary | ICD-10-CM | POA: Diagnosis not present

## 2015-06-12 DIAGNOSIS — M6281 Muscle weakness (generalized): Secondary | ICD-10-CM | POA: Diagnosis not present

## 2015-06-12 DIAGNOSIS — H542 Low vision, both eyes: Secondary | ICD-10-CM | POA: Diagnosis not present

## 2015-06-12 DIAGNOSIS — M810 Age-related osteoporosis without current pathological fracture: Secondary | ICD-10-CM | POA: Diagnosis not present

## 2015-06-15 DIAGNOSIS — R2689 Other abnormalities of gait and mobility: Secondary | ICD-10-CM | POA: Diagnosis not present

## 2015-06-15 DIAGNOSIS — M810 Age-related osteoporosis without current pathological fracture: Secondary | ICD-10-CM | POA: Diagnosis not present

## 2015-06-15 DIAGNOSIS — R278 Other lack of coordination: Secondary | ICD-10-CM | POA: Diagnosis not present

## 2015-06-15 DIAGNOSIS — M25552 Pain in left hip: Secondary | ICD-10-CM | POA: Diagnosis not present

## 2015-06-15 DIAGNOSIS — H542 Low vision, both eyes: Secondary | ICD-10-CM | POA: Diagnosis not present

## 2015-06-15 DIAGNOSIS — M6281 Muscle weakness (generalized): Secondary | ICD-10-CM | POA: Diagnosis not present

## 2015-06-16 DIAGNOSIS — R2689 Other abnormalities of gait and mobility: Secondary | ICD-10-CM | POA: Diagnosis not present

## 2015-06-16 DIAGNOSIS — R278 Other lack of coordination: Secondary | ICD-10-CM | POA: Diagnosis not present

## 2015-06-16 DIAGNOSIS — M6281 Muscle weakness (generalized): Secondary | ICD-10-CM | POA: Diagnosis not present

## 2015-06-16 DIAGNOSIS — M25552 Pain in left hip: Secondary | ICD-10-CM | POA: Diagnosis not present

## 2015-06-16 DIAGNOSIS — M810 Age-related osteoporosis without current pathological fracture: Secondary | ICD-10-CM | POA: Diagnosis not present

## 2015-06-16 DIAGNOSIS — H542 Low vision, both eyes: Secondary | ICD-10-CM | POA: Diagnosis not present

## 2015-06-17 DIAGNOSIS — Z96642 Presence of left artificial hip joint: Secondary | ICD-10-CM | POA: Diagnosis not present

## 2015-06-17 DIAGNOSIS — S72145D Nondisplaced intertrochanteric fracture of left femur, subsequent encounter for closed fracture with routine healing: Secondary | ICD-10-CM | POA: Diagnosis not present

## 2015-06-17 DIAGNOSIS — Z471 Aftercare following joint replacement surgery: Secondary | ICD-10-CM | POA: Diagnosis not present

## 2015-06-17 DIAGNOSIS — S72102D Unspecified trochanteric fracture of left femur, subsequent encounter for closed fracture with routine healing: Secondary | ICD-10-CM | POA: Diagnosis not present

## 2015-06-18 DIAGNOSIS — H542 Low vision, both eyes: Secondary | ICD-10-CM | POA: Diagnosis not present

## 2015-06-18 DIAGNOSIS — M6281 Muscle weakness (generalized): Secondary | ICD-10-CM | POA: Diagnosis not present

## 2015-06-18 DIAGNOSIS — R278 Other lack of coordination: Secondary | ICD-10-CM | POA: Diagnosis not present

## 2015-06-18 DIAGNOSIS — M810 Age-related osteoporosis without current pathological fracture: Secondary | ICD-10-CM | POA: Diagnosis not present

## 2015-06-18 DIAGNOSIS — M25552 Pain in left hip: Secondary | ICD-10-CM | POA: Diagnosis not present

## 2015-06-18 DIAGNOSIS — R2689 Other abnormalities of gait and mobility: Secondary | ICD-10-CM | POA: Diagnosis not present

## 2015-06-19 DIAGNOSIS — M6281 Muscle weakness (generalized): Secondary | ICD-10-CM | POA: Diagnosis not present

## 2015-06-19 DIAGNOSIS — M25552 Pain in left hip: Secondary | ICD-10-CM | POA: Diagnosis not present

## 2015-06-19 DIAGNOSIS — R278 Other lack of coordination: Secondary | ICD-10-CM | POA: Diagnosis not present

## 2015-06-19 DIAGNOSIS — M810 Age-related osteoporosis without current pathological fracture: Secondary | ICD-10-CM | POA: Diagnosis not present

## 2015-06-19 DIAGNOSIS — H542 Low vision, both eyes: Secondary | ICD-10-CM | POA: Diagnosis not present

## 2015-06-19 DIAGNOSIS — R2689 Other abnormalities of gait and mobility: Secondary | ICD-10-CM | POA: Diagnosis not present

## 2015-06-22 DIAGNOSIS — H542 Low vision, both eyes: Secondary | ICD-10-CM | POA: Diagnosis not present

## 2015-06-22 DIAGNOSIS — R278 Other lack of coordination: Secondary | ICD-10-CM | POA: Diagnosis not present

## 2015-06-22 DIAGNOSIS — M6281 Muscle weakness (generalized): Secondary | ICD-10-CM | POA: Diagnosis not present

## 2015-06-22 DIAGNOSIS — M25552 Pain in left hip: Secondary | ICD-10-CM | POA: Diagnosis not present

## 2015-06-22 DIAGNOSIS — M810 Age-related osteoporosis without current pathological fracture: Secondary | ICD-10-CM | POA: Diagnosis not present

## 2015-06-22 DIAGNOSIS — R2689 Other abnormalities of gait and mobility: Secondary | ICD-10-CM | POA: Diagnosis not present

## 2015-06-23 DIAGNOSIS — M25552 Pain in left hip: Secondary | ICD-10-CM | POA: Diagnosis not present

## 2015-06-23 DIAGNOSIS — R2689 Other abnormalities of gait and mobility: Secondary | ICD-10-CM | POA: Diagnosis not present

## 2015-06-23 DIAGNOSIS — H542 Low vision, both eyes: Secondary | ICD-10-CM | POA: Diagnosis not present

## 2015-06-23 DIAGNOSIS — R278 Other lack of coordination: Secondary | ICD-10-CM | POA: Diagnosis not present

## 2015-06-23 DIAGNOSIS — M6281 Muscle weakness (generalized): Secondary | ICD-10-CM | POA: Diagnosis not present

## 2015-06-23 DIAGNOSIS — M810 Age-related osteoporosis without current pathological fracture: Secondary | ICD-10-CM | POA: Diagnosis not present

## 2015-06-24 DIAGNOSIS — M810 Age-related osteoporosis without current pathological fracture: Secondary | ICD-10-CM | POA: Diagnosis not present

## 2015-06-24 DIAGNOSIS — R278 Other lack of coordination: Secondary | ICD-10-CM | POA: Diagnosis not present

## 2015-06-24 DIAGNOSIS — R2689 Other abnormalities of gait and mobility: Secondary | ICD-10-CM | POA: Diagnosis not present

## 2015-06-24 DIAGNOSIS — H542 Low vision, both eyes: Secondary | ICD-10-CM | POA: Diagnosis not present

## 2015-06-24 DIAGNOSIS — M25552 Pain in left hip: Secondary | ICD-10-CM | POA: Diagnosis not present

## 2015-06-24 DIAGNOSIS — M6281 Muscle weakness (generalized): Secondary | ICD-10-CM | POA: Diagnosis not present

## 2015-06-25 DIAGNOSIS — M25552 Pain in left hip: Secondary | ICD-10-CM | POA: Diagnosis not present

## 2015-06-25 DIAGNOSIS — M810 Age-related osteoporosis without current pathological fracture: Secondary | ICD-10-CM | POA: Diagnosis not present

## 2015-06-25 DIAGNOSIS — H542 Low vision, both eyes: Secondary | ICD-10-CM | POA: Diagnosis not present

## 2015-06-25 DIAGNOSIS — R2689 Other abnormalities of gait and mobility: Secondary | ICD-10-CM | POA: Diagnosis not present

## 2015-06-25 DIAGNOSIS — M6281 Muscle weakness (generalized): Secondary | ICD-10-CM | POA: Diagnosis not present

## 2015-06-25 DIAGNOSIS — R278 Other lack of coordination: Secondary | ICD-10-CM | POA: Diagnosis not present

## 2015-06-26 DIAGNOSIS — M25552 Pain in left hip: Secondary | ICD-10-CM | POA: Diagnosis not present

## 2015-06-26 DIAGNOSIS — R2689 Other abnormalities of gait and mobility: Secondary | ICD-10-CM | POA: Diagnosis not present

## 2015-06-26 DIAGNOSIS — M6281 Muscle weakness (generalized): Secondary | ICD-10-CM | POA: Diagnosis not present

## 2015-06-26 DIAGNOSIS — M810 Age-related osteoporosis without current pathological fracture: Secondary | ICD-10-CM | POA: Diagnosis not present

## 2015-06-26 DIAGNOSIS — H542 Low vision, both eyes: Secondary | ICD-10-CM | POA: Diagnosis not present

## 2015-06-26 DIAGNOSIS — R278 Other lack of coordination: Secondary | ICD-10-CM | POA: Diagnosis not present

## 2015-06-29 DIAGNOSIS — M25552 Pain in left hip: Secondary | ICD-10-CM | POA: Diagnosis not present

## 2015-06-29 DIAGNOSIS — R278 Other lack of coordination: Secondary | ICD-10-CM | POA: Diagnosis not present

## 2015-06-29 DIAGNOSIS — R2689 Other abnormalities of gait and mobility: Secondary | ICD-10-CM | POA: Diagnosis not present

## 2015-06-29 DIAGNOSIS — M810 Age-related osteoporosis without current pathological fracture: Secondary | ICD-10-CM | POA: Diagnosis not present

## 2015-06-29 DIAGNOSIS — M84452D Pathological fracture, left femur, subsequent encounter for fracture with routine healing: Secondary | ICD-10-CM | POA: Diagnosis not present

## 2015-06-29 DIAGNOSIS — M6281 Muscle weakness (generalized): Secondary | ICD-10-CM | POA: Diagnosis not present

## 2015-06-29 DIAGNOSIS — Z9181 History of falling: Secondary | ICD-10-CM | POA: Diagnosis not present

## 2015-06-29 DIAGNOSIS — Z4789 Encounter for other orthopedic aftercare: Secondary | ICD-10-CM | POA: Diagnosis not present

## 2015-06-29 DIAGNOSIS — S72145D Nondisplaced intertrochanteric fracture of left femur, subsequent encounter for closed fracture with routine healing: Secondary | ICD-10-CM | POA: Diagnosis not present

## 2015-06-29 DIAGNOSIS — H542 Low vision, both eyes: Secondary | ICD-10-CM | POA: Diagnosis not present

## 2015-06-30 DIAGNOSIS — R2689 Other abnormalities of gait and mobility: Secondary | ICD-10-CM | POA: Diagnosis not present

## 2015-06-30 DIAGNOSIS — M810 Age-related osteoporosis without current pathological fracture: Secondary | ICD-10-CM | POA: Diagnosis not present

## 2015-06-30 DIAGNOSIS — R278 Other lack of coordination: Secondary | ICD-10-CM | POA: Diagnosis not present

## 2015-06-30 DIAGNOSIS — M25552 Pain in left hip: Secondary | ICD-10-CM | POA: Diagnosis not present

## 2015-06-30 DIAGNOSIS — H542 Low vision, both eyes: Secondary | ICD-10-CM | POA: Diagnosis not present

## 2015-06-30 DIAGNOSIS — M6281 Muscle weakness (generalized): Secondary | ICD-10-CM | POA: Diagnosis not present

## 2015-07-01 DIAGNOSIS — M810 Age-related osteoporosis without current pathological fracture: Secondary | ICD-10-CM | POA: Diagnosis not present

## 2015-07-01 DIAGNOSIS — R2689 Other abnormalities of gait and mobility: Secondary | ICD-10-CM | POA: Diagnosis not present

## 2015-07-01 DIAGNOSIS — M6281 Muscle weakness (generalized): Secondary | ICD-10-CM | POA: Diagnosis not present

## 2015-07-01 DIAGNOSIS — H542 Low vision, both eyes: Secondary | ICD-10-CM | POA: Diagnosis not present

## 2015-07-01 DIAGNOSIS — M25552 Pain in left hip: Secondary | ICD-10-CM | POA: Diagnosis not present

## 2015-07-01 DIAGNOSIS — R278 Other lack of coordination: Secondary | ICD-10-CM | POA: Diagnosis not present

## 2015-07-03 DIAGNOSIS — H542 Low vision, both eyes: Secondary | ICD-10-CM | POA: Diagnosis not present

## 2015-07-03 DIAGNOSIS — M810 Age-related osteoporosis without current pathological fracture: Secondary | ICD-10-CM | POA: Diagnosis not present

## 2015-07-03 DIAGNOSIS — M6281 Muscle weakness (generalized): Secondary | ICD-10-CM | POA: Diagnosis not present

## 2015-07-03 DIAGNOSIS — M25552 Pain in left hip: Secondary | ICD-10-CM | POA: Diagnosis not present

## 2015-07-03 DIAGNOSIS — R2689 Other abnormalities of gait and mobility: Secondary | ICD-10-CM | POA: Diagnosis not present

## 2015-07-03 DIAGNOSIS — R278 Other lack of coordination: Secondary | ICD-10-CM | POA: Diagnosis not present

## 2015-07-06 DIAGNOSIS — M810 Age-related osteoporosis without current pathological fracture: Secondary | ICD-10-CM | POA: Diagnosis not present

## 2015-07-06 DIAGNOSIS — R2689 Other abnormalities of gait and mobility: Secondary | ICD-10-CM | POA: Diagnosis not present

## 2015-07-06 DIAGNOSIS — R278 Other lack of coordination: Secondary | ICD-10-CM | POA: Diagnosis not present

## 2015-07-06 DIAGNOSIS — M6281 Muscle weakness (generalized): Secondary | ICD-10-CM | POA: Diagnosis not present

## 2015-07-06 DIAGNOSIS — M25552 Pain in left hip: Secondary | ICD-10-CM | POA: Diagnosis not present

## 2015-07-06 DIAGNOSIS — H542 Low vision, both eyes: Secondary | ICD-10-CM | POA: Diagnosis not present

## 2015-07-08 DIAGNOSIS — M810 Age-related osteoporosis without current pathological fracture: Secondary | ICD-10-CM | POA: Diagnosis not present

## 2015-07-08 DIAGNOSIS — H542 Low vision, both eyes: Secondary | ICD-10-CM | POA: Diagnosis not present

## 2015-07-08 DIAGNOSIS — R2689 Other abnormalities of gait and mobility: Secondary | ICD-10-CM | POA: Diagnosis not present

## 2015-07-08 DIAGNOSIS — R278 Other lack of coordination: Secondary | ICD-10-CM | POA: Diagnosis not present

## 2015-07-08 DIAGNOSIS — M6281 Muscle weakness (generalized): Secondary | ICD-10-CM | POA: Diagnosis not present

## 2015-07-08 DIAGNOSIS — M25552 Pain in left hip: Secondary | ICD-10-CM | POA: Diagnosis not present

## 2015-07-10 DIAGNOSIS — R2689 Other abnormalities of gait and mobility: Secondary | ICD-10-CM | POA: Diagnosis not present

## 2015-07-10 DIAGNOSIS — M6281 Muscle weakness (generalized): Secondary | ICD-10-CM | POA: Diagnosis not present

## 2015-07-10 DIAGNOSIS — H542 Low vision, both eyes: Secondary | ICD-10-CM | POA: Diagnosis not present

## 2015-07-10 DIAGNOSIS — R278 Other lack of coordination: Secondary | ICD-10-CM | POA: Diagnosis not present

## 2015-07-10 DIAGNOSIS — M25552 Pain in left hip: Secondary | ICD-10-CM | POA: Diagnosis not present

## 2015-07-10 DIAGNOSIS — M810 Age-related osteoporosis without current pathological fracture: Secondary | ICD-10-CM | POA: Diagnosis not present

## 2015-07-13 DIAGNOSIS — R2689 Other abnormalities of gait and mobility: Secondary | ICD-10-CM | POA: Diagnosis not present

## 2015-07-13 DIAGNOSIS — M6281 Muscle weakness (generalized): Secondary | ICD-10-CM | POA: Diagnosis not present

## 2015-07-13 DIAGNOSIS — M25552 Pain in left hip: Secondary | ICD-10-CM | POA: Diagnosis not present

## 2015-07-13 DIAGNOSIS — R278 Other lack of coordination: Secondary | ICD-10-CM | POA: Diagnosis not present

## 2015-07-13 DIAGNOSIS — M810 Age-related osteoporosis without current pathological fracture: Secondary | ICD-10-CM | POA: Diagnosis not present

## 2015-07-13 DIAGNOSIS — H542 Low vision, both eyes: Secondary | ICD-10-CM | POA: Diagnosis not present

## 2015-07-15 DIAGNOSIS — H542 Low vision, both eyes: Secondary | ICD-10-CM | POA: Diagnosis not present

## 2015-07-15 DIAGNOSIS — M25552 Pain in left hip: Secondary | ICD-10-CM | POA: Diagnosis not present

## 2015-07-15 DIAGNOSIS — M810 Age-related osteoporosis without current pathological fracture: Secondary | ICD-10-CM | POA: Diagnosis not present

## 2015-07-15 DIAGNOSIS — R278 Other lack of coordination: Secondary | ICD-10-CM | POA: Diagnosis not present

## 2015-07-15 DIAGNOSIS — R2689 Other abnormalities of gait and mobility: Secondary | ICD-10-CM | POA: Diagnosis not present

## 2015-07-15 DIAGNOSIS — M6281 Muscle weakness (generalized): Secondary | ICD-10-CM | POA: Diagnosis not present

## 2015-07-24 DIAGNOSIS — R2689 Other abnormalities of gait and mobility: Secondary | ICD-10-CM | POA: Diagnosis not present

## 2015-07-24 DIAGNOSIS — H542 Low vision, both eyes: Secondary | ICD-10-CM | POA: Diagnosis not present

## 2015-07-24 DIAGNOSIS — R278 Other lack of coordination: Secondary | ICD-10-CM | POA: Diagnosis not present

## 2015-07-24 DIAGNOSIS — M25552 Pain in left hip: Secondary | ICD-10-CM | POA: Diagnosis not present

## 2015-07-24 DIAGNOSIS — M6281 Muscle weakness (generalized): Secondary | ICD-10-CM | POA: Diagnosis not present

## 2015-07-24 DIAGNOSIS — M810 Age-related osteoporosis without current pathological fracture: Secondary | ICD-10-CM | POA: Diagnosis not present

## 2015-07-27 DIAGNOSIS — R2689 Other abnormalities of gait and mobility: Secondary | ICD-10-CM | POA: Diagnosis not present

## 2015-07-27 DIAGNOSIS — M25552 Pain in left hip: Secondary | ICD-10-CM | POA: Diagnosis not present

## 2015-07-27 DIAGNOSIS — R278 Other lack of coordination: Secondary | ICD-10-CM | POA: Diagnosis not present

## 2015-07-27 DIAGNOSIS — M810 Age-related osteoporosis without current pathological fracture: Secondary | ICD-10-CM | POA: Diagnosis not present

## 2015-07-27 DIAGNOSIS — M6281 Muscle weakness (generalized): Secondary | ICD-10-CM | POA: Diagnosis not present

## 2015-07-27 DIAGNOSIS — H542 Low vision, both eyes: Secondary | ICD-10-CM | POA: Diagnosis not present

## 2015-07-28 DIAGNOSIS — M810 Age-related osteoporosis without current pathological fracture: Secondary | ICD-10-CM | POA: Diagnosis not present

## 2015-07-28 DIAGNOSIS — R2689 Other abnormalities of gait and mobility: Secondary | ICD-10-CM | POA: Diagnosis not present

## 2015-07-28 DIAGNOSIS — R278 Other lack of coordination: Secondary | ICD-10-CM | POA: Diagnosis not present

## 2015-07-28 DIAGNOSIS — H542 Low vision, both eyes: Secondary | ICD-10-CM | POA: Diagnosis not present

## 2015-07-28 DIAGNOSIS — M25552 Pain in left hip: Secondary | ICD-10-CM | POA: Diagnosis not present

## 2015-07-28 DIAGNOSIS — M6281 Muscle weakness (generalized): Secondary | ICD-10-CM | POA: Diagnosis not present

## 2015-08-03 DIAGNOSIS — Z9181 History of falling: Secondary | ICD-10-CM | POA: Diagnosis not present

## 2015-08-03 DIAGNOSIS — H542 Low vision, both eyes: Secondary | ICD-10-CM | POA: Diagnosis not present

## 2015-08-03 DIAGNOSIS — S72145D Nondisplaced intertrochanteric fracture of left femur, subsequent encounter for closed fracture with routine healing: Secondary | ICD-10-CM | POA: Diagnosis not present

## 2015-08-03 DIAGNOSIS — R278 Other lack of coordination: Secondary | ICD-10-CM | POA: Diagnosis not present

## 2015-08-03 DIAGNOSIS — M810 Age-related osteoporosis without current pathological fracture: Secondary | ICD-10-CM | POA: Diagnosis not present

## 2015-08-03 DIAGNOSIS — Z4789 Encounter for other orthopedic aftercare: Secondary | ICD-10-CM | POA: Diagnosis not present

## 2015-08-03 DIAGNOSIS — M6281 Muscle weakness (generalized): Secondary | ICD-10-CM | POA: Diagnosis not present

## 2015-08-05 DIAGNOSIS — M810 Age-related osteoporosis without current pathological fracture: Secondary | ICD-10-CM | POA: Diagnosis not present

## 2015-08-05 DIAGNOSIS — S72145D Nondisplaced intertrochanteric fracture of left femur, subsequent encounter for closed fracture with routine healing: Secondary | ICD-10-CM | POA: Diagnosis not present

## 2015-08-05 DIAGNOSIS — H542 Low vision, both eyes: Secondary | ICD-10-CM | POA: Diagnosis not present

## 2015-08-05 DIAGNOSIS — Z4789 Encounter for other orthopedic aftercare: Secondary | ICD-10-CM | POA: Diagnosis not present

## 2015-08-05 DIAGNOSIS — M6281 Muscle weakness (generalized): Secondary | ICD-10-CM | POA: Diagnosis not present

## 2015-08-05 DIAGNOSIS — R278 Other lack of coordination: Secondary | ICD-10-CM | POA: Diagnosis not present

## 2015-08-11 DIAGNOSIS — H542 Low vision, both eyes: Secondary | ICD-10-CM | POA: Diagnosis not present

## 2015-08-11 DIAGNOSIS — Z4789 Encounter for other orthopedic aftercare: Secondary | ICD-10-CM | POA: Diagnosis not present

## 2015-08-11 DIAGNOSIS — M6281 Muscle weakness (generalized): Secondary | ICD-10-CM | POA: Diagnosis not present

## 2015-08-11 DIAGNOSIS — S72145D Nondisplaced intertrochanteric fracture of left femur, subsequent encounter for closed fracture with routine healing: Secondary | ICD-10-CM | POA: Diagnosis not present

## 2015-08-11 DIAGNOSIS — M810 Age-related osteoporosis without current pathological fracture: Secondary | ICD-10-CM | POA: Diagnosis not present

## 2015-08-11 DIAGNOSIS — R278 Other lack of coordination: Secondary | ICD-10-CM | POA: Diagnosis not present

## 2015-08-13 DIAGNOSIS — Z4789 Encounter for other orthopedic aftercare: Secondary | ICD-10-CM | POA: Diagnosis not present

## 2015-08-13 DIAGNOSIS — R278 Other lack of coordination: Secondary | ICD-10-CM | POA: Diagnosis not present

## 2015-08-13 DIAGNOSIS — M810 Age-related osteoporosis without current pathological fracture: Secondary | ICD-10-CM | POA: Diagnosis not present

## 2015-08-13 DIAGNOSIS — H542 Low vision, both eyes: Secondary | ICD-10-CM | POA: Diagnosis not present

## 2015-08-13 DIAGNOSIS — M6281 Muscle weakness (generalized): Secondary | ICD-10-CM | POA: Diagnosis not present

## 2015-08-13 DIAGNOSIS — S72145D Nondisplaced intertrochanteric fracture of left femur, subsequent encounter for closed fracture with routine healing: Secondary | ICD-10-CM | POA: Diagnosis not present

## 2015-08-18 DIAGNOSIS — S72145D Nondisplaced intertrochanteric fracture of left femur, subsequent encounter for closed fracture with routine healing: Secondary | ICD-10-CM | POA: Diagnosis not present

## 2015-08-18 DIAGNOSIS — Z4789 Encounter for other orthopedic aftercare: Secondary | ICD-10-CM | POA: Diagnosis not present

## 2015-08-18 DIAGNOSIS — H542 Low vision, both eyes: Secondary | ICD-10-CM | POA: Diagnosis not present

## 2015-08-18 DIAGNOSIS — R278 Other lack of coordination: Secondary | ICD-10-CM | POA: Diagnosis not present

## 2015-08-18 DIAGNOSIS — M6281 Muscle weakness (generalized): Secondary | ICD-10-CM | POA: Diagnosis not present

## 2015-08-18 DIAGNOSIS — M810 Age-related osteoporosis without current pathological fracture: Secondary | ICD-10-CM | POA: Diagnosis not present

## 2015-08-20 DIAGNOSIS — S72145D Nondisplaced intertrochanteric fracture of left femur, subsequent encounter for closed fracture with routine healing: Secondary | ICD-10-CM | POA: Diagnosis not present

## 2015-08-20 DIAGNOSIS — M810 Age-related osteoporosis without current pathological fracture: Secondary | ICD-10-CM | POA: Diagnosis not present

## 2015-08-20 DIAGNOSIS — R278 Other lack of coordination: Secondary | ICD-10-CM | POA: Diagnosis not present

## 2015-08-20 DIAGNOSIS — M6281 Muscle weakness (generalized): Secondary | ICD-10-CM | POA: Diagnosis not present

## 2015-08-20 DIAGNOSIS — Z4789 Encounter for other orthopedic aftercare: Secondary | ICD-10-CM | POA: Diagnosis not present

## 2015-08-20 DIAGNOSIS — H542 Low vision, both eyes: Secondary | ICD-10-CM | POA: Diagnosis not present

## 2015-08-25 DIAGNOSIS — M810 Age-related osteoporosis without current pathological fracture: Secondary | ICD-10-CM | POA: Diagnosis not present

## 2015-08-25 DIAGNOSIS — S72145D Nondisplaced intertrochanteric fracture of left femur, subsequent encounter for closed fracture with routine healing: Secondary | ICD-10-CM | POA: Diagnosis not present

## 2015-08-25 DIAGNOSIS — R278 Other lack of coordination: Secondary | ICD-10-CM | POA: Diagnosis not present

## 2015-08-25 DIAGNOSIS — M6281 Muscle weakness (generalized): Secondary | ICD-10-CM | POA: Diagnosis not present

## 2015-08-25 DIAGNOSIS — H542 Low vision, both eyes: Secondary | ICD-10-CM | POA: Diagnosis not present

## 2015-08-25 DIAGNOSIS — Z4789 Encounter for other orthopedic aftercare: Secondary | ICD-10-CM | POA: Diagnosis not present

## 2015-08-27 DIAGNOSIS — H353211 Exudative age-related macular degeneration, right eye, with active choroidal neovascularization: Secondary | ICD-10-CM | POA: Diagnosis not present

## 2015-08-28 DIAGNOSIS — Z9181 History of falling: Secondary | ICD-10-CM | POA: Diagnosis not present

## 2015-08-28 DIAGNOSIS — H542 Low vision, both eyes: Secondary | ICD-10-CM | POA: Diagnosis not present

## 2015-08-28 DIAGNOSIS — S72145D Nondisplaced intertrochanteric fracture of left femur, subsequent encounter for closed fracture with routine healing: Secondary | ICD-10-CM | POA: Diagnosis not present

## 2015-08-28 DIAGNOSIS — Z4789 Encounter for other orthopedic aftercare: Secondary | ICD-10-CM | POA: Diagnosis not present

## 2015-08-28 DIAGNOSIS — M6281 Muscle weakness (generalized): Secondary | ICD-10-CM | POA: Diagnosis not present

## 2015-08-28 DIAGNOSIS — M810 Age-related osteoporosis without current pathological fracture: Secondary | ICD-10-CM | POA: Diagnosis not present

## 2015-08-28 DIAGNOSIS — R278 Other lack of coordination: Secondary | ICD-10-CM | POA: Diagnosis not present

## 2015-09-01 DIAGNOSIS — S72145D Nondisplaced intertrochanteric fracture of left femur, subsequent encounter for closed fracture with routine healing: Secondary | ICD-10-CM | POA: Diagnosis not present

## 2015-09-01 DIAGNOSIS — Z4789 Encounter for other orthopedic aftercare: Secondary | ICD-10-CM | POA: Diagnosis not present

## 2015-09-01 DIAGNOSIS — H542 Low vision, both eyes: Secondary | ICD-10-CM | POA: Diagnosis not present

## 2015-09-01 DIAGNOSIS — M6281 Muscle weakness (generalized): Secondary | ICD-10-CM | POA: Diagnosis not present

## 2015-09-01 DIAGNOSIS — M810 Age-related osteoporosis without current pathological fracture: Secondary | ICD-10-CM | POA: Diagnosis not present

## 2015-09-01 DIAGNOSIS — R278 Other lack of coordination: Secondary | ICD-10-CM | POA: Diagnosis not present

## 2015-09-04 DIAGNOSIS — M6281 Muscle weakness (generalized): Secondary | ICD-10-CM | POA: Diagnosis not present

## 2015-09-04 DIAGNOSIS — H542 Low vision, both eyes: Secondary | ICD-10-CM | POA: Diagnosis not present

## 2015-09-04 DIAGNOSIS — S72145D Nondisplaced intertrochanteric fracture of left femur, subsequent encounter for closed fracture with routine healing: Secondary | ICD-10-CM | POA: Diagnosis not present

## 2015-09-04 DIAGNOSIS — M810 Age-related osteoporosis without current pathological fracture: Secondary | ICD-10-CM | POA: Diagnosis not present

## 2015-09-04 DIAGNOSIS — Z4789 Encounter for other orthopedic aftercare: Secondary | ICD-10-CM | POA: Diagnosis not present

## 2015-09-04 DIAGNOSIS — R278 Other lack of coordination: Secondary | ICD-10-CM | POA: Diagnosis not present

## 2015-09-08 DIAGNOSIS — R278 Other lack of coordination: Secondary | ICD-10-CM | POA: Diagnosis not present

## 2015-09-08 DIAGNOSIS — M6281 Muscle weakness (generalized): Secondary | ICD-10-CM | POA: Diagnosis not present

## 2015-09-08 DIAGNOSIS — Z4789 Encounter for other orthopedic aftercare: Secondary | ICD-10-CM | POA: Diagnosis not present

## 2015-09-08 DIAGNOSIS — M810 Age-related osteoporosis without current pathological fracture: Secondary | ICD-10-CM | POA: Diagnosis not present

## 2015-09-08 DIAGNOSIS — S72145D Nondisplaced intertrochanteric fracture of left femur, subsequent encounter for closed fracture with routine healing: Secondary | ICD-10-CM | POA: Diagnosis not present

## 2015-09-08 DIAGNOSIS — H542 Low vision, both eyes: Secondary | ICD-10-CM | POA: Diagnosis not present

## 2015-09-10 DIAGNOSIS — Z4789 Encounter for other orthopedic aftercare: Secondary | ICD-10-CM | POA: Diagnosis not present

## 2015-09-10 DIAGNOSIS — M810 Age-related osteoporosis without current pathological fracture: Secondary | ICD-10-CM | POA: Diagnosis not present

## 2015-09-10 DIAGNOSIS — M6281 Muscle weakness (generalized): Secondary | ICD-10-CM | POA: Diagnosis not present

## 2015-09-10 DIAGNOSIS — R278 Other lack of coordination: Secondary | ICD-10-CM | POA: Diagnosis not present

## 2015-09-10 DIAGNOSIS — H542 Low vision, both eyes: Secondary | ICD-10-CM | POA: Diagnosis not present

## 2015-09-10 DIAGNOSIS — S72145D Nondisplaced intertrochanteric fracture of left femur, subsequent encounter for closed fracture with routine healing: Secondary | ICD-10-CM | POA: Diagnosis not present

## 2015-09-15 DIAGNOSIS — H542 Low vision, both eyes: Secondary | ICD-10-CM | POA: Diagnosis not present

## 2015-09-15 DIAGNOSIS — M810 Age-related osteoporosis without current pathological fracture: Secondary | ICD-10-CM | POA: Diagnosis not present

## 2015-09-15 DIAGNOSIS — S72145D Nondisplaced intertrochanteric fracture of left femur, subsequent encounter for closed fracture with routine healing: Secondary | ICD-10-CM | POA: Diagnosis not present

## 2015-09-15 DIAGNOSIS — Z4789 Encounter for other orthopedic aftercare: Secondary | ICD-10-CM | POA: Diagnosis not present

## 2015-09-15 DIAGNOSIS — M6281 Muscle weakness (generalized): Secondary | ICD-10-CM | POA: Diagnosis not present

## 2015-09-15 DIAGNOSIS — R278 Other lack of coordination: Secondary | ICD-10-CM | POA: Diagnosis not present

## 2015-09-22 DIAGNOSIS — S72145D Nondisplaced intertrochanteric fracture of left femur, subsequent encounter for closed fracture with routine healing: Secondary | ICD-10-CM | POA: Diagnosis not present

## 2015-09-22 DIAGNOSIS — M6281 Muscle weakness (generalized): Secondary | ICD-10-CM | POA: Diagnosis not present

## 2015-09-22 DIAGNOSIS — H542 Low vision, both eyes: Secondary | ICD-10-CM | POA: Diagnosis not present

## 2015-09-22 DIAGNOSIS — M810 Age-related osteoporosis without current pathological fracture: Secondary | ICD-10-CM | POA: Diagnosis not present

## 2015-09-22 DIAGNOSIS — R278 Other lack of coordination: Secondary | ICD-10-CM | POA: Diagnosis not present

## 2015-09-22 DIAGNOSIS — Z4789 Encounter for other orthopedic aftercare: Secondary | ICD-10-CM | POA: Diagnosis not present

## 2015-09-24 DIAGNOSIS — S72145D Nondisplaced intertrochanteric fracture of left femur, subsequent encounter for closed fracture with routine healing: Secondary | ICD-10-CM | POA: Diagnosis not present

## 2015-09-24 DIAGNOSIS — M6281 Muscle weakness (generalized): Secondary | ICD-10-CM | POA: Diagnosis not present

## 2015-09-24 DIAGNOSIS — H542 Low vision, both eyes: Secondary | ICD-10-CM | POA: Diagnosis not present

## 2015-09-24 DIAGNOSIS — M810 Age-related osteoporosis without current pathological fracture: Secondary | ICD-10-CM | POA: Diagnosis not present

## 2015-09-24 DIAGNOSIS — Z4789 Encounter for other orthopedic aftercare: Secondary | ICD-10-CM | POA: Diagnosis not present

## 2015-09-24 DIAGNOSIS — R278 Other lack of coordination: Secondary | ICD-10-CM | POA: Diagnosis not present

## 2015-09-29 DIAGNOSIS — S72145D Nondisplaced intertrochanteric fracture of left femur, subsequent encounter for closed fracture with routine healing: Secondary | ICD-10-CM | POA: Diagnosis not present

## 2015-09-29 DIAGNOSIS — Z9181 History of falling: Secondary | ICD-10-CM | POA: Diagnosis not present

## 2015-09-29 DIAGNOSIS — R278 Other lack of coordination: Secondary | ICD-10-CM | POA: Diagnosis not present

## 2015-09-29 DIAGNOSIS — M810 Age-related osteoporosis without current pathological fracture: Secondary | ICD-10-CM | POA: Diagnosis not present

## 2015-09-29 DIAGNOSIS — Z4789 Encounter for other orthopedic aftercare: Secondary | ICD-10-CM | POA: Diagnosis not present

## 2015-09-29 DIAGNOSIS — M6281 Muscle weakness (generalized): Secondary | ICD-10-CM | POA: Diagnosis not present

## 2015-09-29 DIAGNOSIS — H542 Low vision, both eyes: Secondary | ICD-10-CM | POA: Diagnosis not present

## 2015-12-10 DIAGNOSIS — H401114 Primary open-angle glaucoma, right eye, indeterminate stage: Secondary | ICD-10-CM | POA: Diagnosis not present

## 2015-12-10 DIAGNOSIS — H31012 Macula scars of posterior pole (postinflammatory) (post-traumatic), left eye: Secondary | ICD-10-CM | POA: Diagnosis not present

## 2015-12-10 DIAGNOSIS — H353211 Exudative age-related macular degeneration, right eye, with active choroidal neovascularization: Secondary | ICD-10-CM | POA: Diagnosis not present

## 2015-12-10 DIAGNOSIS — Z961 Presence of intraocular lens: Secondary | ICD-10-CM | POA: Diagnosis not present

## 2016-01-22 DIAGNOSIS — H353211 Exudative age-related macular degeneration, right eye, with active choroidal neovascularization: Secondary | ICD-10-CM | POA: Diagnosis not present

## 2016-01-22 DIAGNOSIS — Z961 Presence of intraocular lens: Secondary | ICD-10-CM | POA: Diagnosis not present

## 2016-01-22 DIAGNOSIS — Z87891 Personal history of nicotine dependence: Secondary | ICD-10-CM | POA: Diagnosis not present

## 2016-01-22 DIAGNOSIS — H3561 Retinal hemorrhage, right eye: Secondary | ICD-10-CM | POA: Diagnosis not present

## 2016-01-22 DIAGNOSIS — Z9842 Cataract extraction status, left eye: Secondary | ICD-10-CM | POA: Diagnosis not present

## 2016-01-22 DIAGNOSIS — H31012 Macula scars of posterior pole (postinflammatory) (post-traumatic), left eye: Secondary | ICD-10-CM | POA: Diagnosis not present

## 2016-01-22 DIAGNOSIS — Z9841 Cataract extraction status, right eye: Secondary | ICD-10-CM | POA: Diagnosis not present

## 2016-02-25 DIAGNOSIS — H353211 Exudative age-related macular degeneration, right eye, with active choroidal neovascularization: Secondary | ICD-10-CM | POA: Diagnosis not present

## 2016-04-07 DIAGNOSIS — H353211 Exudative age-related macular degeneration, right eye, with active choroidal neovascularization: Secondary | ICD-10-CM | POA: Diagnosis not present

## 2016-04-14 DIAGNOSIS — Z23 Encounter for immunization: Secondary | ICD-10-CM | POA: Diagnosis not present

## 2016-04-18 ENCOUNTER — Telehealth: Payer: Self-pay | Admitting: Internal Medicine

## 2016-04-18 ENCOUNTER — Encounter: Payer: Self-pay | Admitting: Internal Medicine

## 2016-04-18 NOTE — Telephone Encounter (Signed)
Caller from [facility] to request diagnosis for cosopt. Diagnosis provided was blindness. I reviewed chart and gave the diagnosis of macular degeneration.

## 2016-04-20 ENCOUNTER — Encounter: Payer: Self-pay | Admitting: Internal Medicine

## 2016-04-20 ENCOUNTER — Encounter: Payer: Medicare Other | Admitting: Internal Medicine

## 2016-04-20 ENCOUNTER — Non-Acute Institutional Stay: Payer: Medicare Other | Admitting: Internal Medicine

## 2016-04-20 VITALS — BP 128/70 | HR 69 | Temp 99.0°F | Wt 109.0 lb

## 2016-04-20 DIAGNOSIS — R739 Hyperglycemia, unspecified: Secondary | ICD-10-CM

## 2016-04-20 DIAGNOSIS — H547 Unspecified visual loss: Secondary | ICD-10-CM | POA: Diagnosis not present

## 2016-04-20 DIAGNOSIS — M810 Age-related osteoporosis without current pathological fracture: Secondary | ICD-10-CM | POA: Diagnosis not present

## 2016-04-20 DIAGNOSIS — E871 Hypo-osmolality and hyponatremia: Secondary | ICD-10-CM

## 2016-04-20 NOTE — Progress Notes (Signed)
Location:  Occupational psychologist of Service:  Clinic (12)  Provider: Jayren Cease L. Mariea Clonts, D.O., C.M.D.  Code Status: DNR, reviewed MOST today which required revision--pt requests comfort measures/avoid hospitalization, abx to be determined case by case, fluids only if absolutely necessary, no tube feeding--approximately 20 mins was spent on this discussion and rest of visit on med mgt  Goals of Care:  Advanced Directives 04/20/2016  Does patient have an advance directive? Yes  Type of Advance Directive Out of facility DNR (pink MOST or yellow form);Healthcare Power of Attorney  Does patient want to make changes to advanced directive? -  Copy of advanced directive(s) in chart? Yes  Pre-existing out of facility DNR order (yellow form or pink MOST form) Yellow form placed in chart (order not valid for inpatient use);Pink MOST form placed in chart (order not valid for inpatient use)   Chief Complaint  Patient presents with  . Medical Management of Chronic Issues    follow-up    HPI: Patient is a 82 y.o. female seen today for medical management of chronic diseases.    She continues to walk regularly.  She is racking up miles.  Feels she has recovered pretty well.  If she gets into a high vehicle, she will get a twinge here and there.  Hip repair only.    Her lack of vision is her biggest problem.   Continues to do all of her ADLs on her own.   2013 had compression fractures in her back.  Dr. Gladstone Lighter could not do kyphoplasty.  She did take fosamax when she her initial osteoporosis dx-she started with leg pain after 3 mos so she stopped it.  Her fall with hip fx was due to a 75 lb dog knocking her on an asphalt driveway.   She does not want to pursue a bone density test right now.  She is trying to move items and empty a home in [place] on her own right now.   Uses nustep machine.  Also writes her own checks and things.   Reports she does not have a lot to look forward to  and does not want her life prolonged.    Past Medical History:  Diagnosis Date  . Anxiety   . Arthritis   . Back pain   . Borderline diabetes   . Compression fracture of lumbar vertebra ([place]) 2013  . Constipation   . Displaced intertrochanteric fracture of left femur ([place])   . Fracture of unspecified part of neck of unspecified femur, initial encounter for closed fracture ([place])   . GERD (gastroesophageal reflux disease)   . Hyperglycemia   . Hypo-osmolality and hyponatremia   . Macular degeneration    both eyes, blind  . Osteoporosis     Past Surgical History:  Procedure Laterality Date  . CATARACT EXTRACTION    . HIP FRACTURE SURGERY Left 2016  . INTRAMEDULLARY (IM) NAIL INTERTROCHANTERIC Left 03/24/2015   Procedure: INTRAMEDULLARY (IM) NAIL INTERTROCHANTRIC;  Surgeon: Rod Can, MD;  Location: [facility];  Service: Orthopedics;  Laterality: Left;  . TONSILLECTOMY      No Known Allergies    Medication List       Accurate as of 04/20/16  1:46 PM. Always use your most recent med list.          acetaminophen 500 MG tablet Commonly known as:  TYLENOL Take 1,000 mg by mouth every 6 (six) hours as needed. For pain.   cholecalciferol 1000 units tablet  Commonly known as:  VITAMIN D Take 2,000 Units by mouth daily.   dorzolamide-timolol 22.3-6.8 MG/ML ophthalmic solution Commonly known as:  COSOPT Place 1 drop into the right eye 2 (two) times daily.   multivitamin with minerals Tabs tablet Take 1 tablet by mouth daily.   omega-3 acid ethyl esters 1 g capsule Commonly known as:  LOVAZA Take 1 g by mouth daily.   polyethylene glycol packet Commonly known as:  MIRALAX / GLYCOLAX Take 17 g by mouth daily.   TRAVATAN Z 0.004 % Soln ophthalmic solution Generic drug:  Travoprost (BAK Free) Place 1 drop into the right eye at bedtime.       Review of Systems:  Review of Systems  Constitutional: Negative for chills and fever.  HENT: Negative for hearing loss.     Eyes:       Blind  Respiratory: Negative for shortness of breath.   Cardiovascular: Negative for chest pain, palpitations and leg swelling.  Gastrointestinal: Negative for abdominal pain, blood in stool, constipation and melena.  Genitourinary: Negative for dysuria, frequency and urgency.  Musculoskeletal: Negative for back pain, falls and joint pain.  Skin: Negative for itching and rash.  Neurological: Negative for dizziness, loss of consciousness and headaches.  Endo/Heme/Allergies: Does not bruise/bleed easily.  Psychiatric/Behavioral: Negative for memory loss.    Health Maintenance  Topic Date Due  . TETANUS/TDAP  12/16/1952  . ZOSTAVAX  12/16/1993  . DEXA SCAN  12/17/1998  . PNA vac Low Risk Adult (2 of 2 - PPSV23) 06/07/2015  . INFLUENZA VACCINE  Completed    Physical Exam: Vitals:   04/20/16 1331  BP: 128/70  Pulse: 69  Temp: 99 F (37.2 C)  TempSrc: Oral  SpO2: 97%  Weight: 109 lb (49.4 kg)   Body mass index is 15.64 kg/m. Physical Exam  Constitutional: She is oriented to person, place, and time. She appears well-developed and well-nourished. No distress.  Eyes:  blind  Cardiovascular: Normal rate, regular rhythm, normal heart sounds and intact distal pulses.   Pulmonary/Chest: Effort normal and breath sounds normal. No respiratory distress.  Abdominal: Soft. Bowel sounds are normal.  Musculoskeletal: Normal range of motion.  Kyphosis, walks with walker  Neurological: She is alert and oriented to person, place, and time.  Skin: Skin is warm and dry. Capillary refill takes less than 2 seconds.  Psychiatric: She has a normal mood and affect.    Labs reviewed:  Lab Results  Component Value Date   HGBA1C 6.1 (H) 03/23/2015   Assessment/Plan 1. Senile osteoporosis -with prior lumbar compression fxs and kyphosis--she wants to wait on a bone density study at this point until she clears up her issues with her home in [place]  2. Blindness -her biggest  problem, following with retina specialist and next appt is next month; she wanted to live in [place], but there were concerns that in an emergency, she may not be able to find her way out on her own  3. Hyperglycemia -f/u hba1c next draw  4. Hyponatremia -f/u hba1c next draw due to this in the past and hypokalemia she had a year ago  Labs/tests ordered:  Cbc with diff, cmp with gfr, hba1c next draw Next appt:  6 mos for awv, med mgt  Sakeenah Valcarcel L. Warnell Rasnic, D.O. [facility] [address], [place] [address] Cell Phone (Mon-Fri 8am-5pm):  [phone] On Call:  [phone] & follow prompts after 5pm & weekends Office Phone:  [phone] Office  Fax:  [phone]

## 2016-04-21 ENCOUNTER — Telehealth: Payer: Self-pay

## 2016-04-21 DIAGNOSIS — E876 Hypokalemia: Secondary | ICD-10-CM | POA: Diagnosis not present

## 2016-04-21 DIAGNOSIS — R739 Hyperglycemia, unspecified: Secondary | ICD-10-CM | POA: Diagnosis not present

## 2016-04-21 LAB — BASIC METABOLIC PANEL
BUN: 13 mg/dL (ref 4–21)
Creatinine: 0.4 mg/dL — AB (ref 0.5–1.1)
Glucose: 95 mg/dL
Potassium: 4.4 mmol/L (ref 3.4–5.3)
Sodium: 137 mmol/L (ref 137–147)

## 2016-04-21 LAB — HEMOGLOBIN A1C: Hemoglobin A1C: 5.9

## 2016-04-21 LAB — HEPATIC FUNCTION PANEL
ALT: 9 U/L (ref 7–35)
AST: 16 U/L (ref 13–35)
Alkaline Phosphatase: 93 U/L (ref 25–125)
Bilirubin, Total: 0.4 mg/dL

## 2016-04-21 LAB — CBC AND DIFFERENTIAL
HCT: 38 % (ref 36–46)
Hemoglobin: 12.6 g/dL (ref 12.0–16.0)
Platelets: 169 10*3/uL (ref 150–399)
WBC: 5.1 10^3/mL

## 2016-04-21 NOTE — Telephone Encounter (Signed)
A letter was received from [facility] stating that the non-formulary exception request for dorzolamide-timolol ophthalmic solution for treatment of blindness in both eyes and macular degeneration has been DENIED.   The reason for denial was the medication is not being prescribed for a FDA labeled or medically accepted use.   The appeal form has been completed and needs provider's signature. Forms were placed in Dr. Cyndi Lennert folder for review and signing.   Please fax appeal form to: [phone]

## 2016-04-22 ENCOUNTER — Telehealth: Payer: Self-pay

## 2016-04-22 NOTE — Telephone Encounter (Signed)
Per Karena Addison (Dr.Reed's Assistant) patient needs to call her eye doctor for a refill on eye drops.  Letter was received from [facility] [place] denying coverage.   I left message on voicemail informing patient of the above information. Patient instructed to call if she has questions or concerns.

## 2016-05-02 ENCOUNTER — Encounter: Payer: Self-pay | Admitting: Internal Medicine

## 2016-05-26 DIAGNOSIS — Z961 Presence of intraocular lens: Secondary | ICD-10-CM | POA: Diagnosis not present

## 2016-05-26 DIAGNOSIS — H353211 Exudative age-related macular degeneration, right eye, with active choroidal neovascularization: Secondary | ICD-10-CM | POA: Diagnosis not present

## 2016-05-26 DIAGNOSIS — H401114 Primary open-angle glaucoma, right eye, indeterminate stage: Secondary | ICD-10-CM | POA: Diagnosis not present

## 2016-05-26 DIAGNOSIS — H31012 Macula scars of posterior pole (postinflammatory) (post-traumatic), left eye: Secondary | ICD-10-CM | POA: Diagnosis not present

## 2016-07-19 DIAGNOSIS — H353211 Exudative age-related macular degeneration, right eye, with active choroidal neovascularization: Secondary | ICD-10-CM | POA: Diagnosis not present

## 2016-09-08 DIAGNOSIS — H353211 Exudative age-related macular degeneration, right eye, with active choroidal neovascularization: Secondary | ICD-10-CM | POA: Diagnosis not present

## 2016-09-08 DIAGNOSIS — H31012 Macula scars of posterior pole (postinflammatory) (post-traumatic), left eye: Secondary | ICD-10-CM | POA: Diagnosis not present

## 2016-10-19 ENCOUNTER — Encounter: Payer: Self-pay | Admitting: Internal Medicine

## 2016-10-19 ENCOUNTER — Non-Acute Institutional Stay: Payer: Medicare Other | Admitting: Internal Medicine

## 2016-10-19 VITALS — BP 132/80 | HR 71 | Temp 98.7°F | Ht 70.0 in | Wt 108.0 lb

## 2016-10-19 DIAGNOSIS — R739 Hyperglycemia, unspecified: Secondary | ICD-10-CM | POA: Diagnosis not present

## 2016-10-19 DIAGNOSIS — E871 Hypo-osmolality and hyponatremia: Secondary | ICD-10-CM

## 2016-10-19 DIAGNOSIS — S72102D Unspecified trochanteric fracture of left femur, subsequent encounter for closed fracture with routine healing: Secondary | ICD-10-CM | POA: Diagnosis not present

## 2016-10-19 DIAGNOSIS — Z0000 Encounter for general adult medical examination without abnormal findings: Secondary | ICD-10-CM | POA: Diagnosis not present

## 2016-10-19 DIAGNOSIS — M810 Age-related osteoporosis without current pathological fracture: Secondary | ICD-10-CM

## 2016-10-19 DIAGNOSIS — H547 Unspecified visual loss: Secondary | ICD-10-CM | POA: Diagnosis not present

## 2016-10-19 NOTE — Progress Notes (Signed)
Location:  Occupational psychologist of Service:  Clinic (12) Provider: Renan Danese L. Mariea Clonts, D.O., C.M.D.  Patient Care Team: Gayland Curry, DO as PCP - General (Geriatric Medicine) Gerarda Fraction, MD as Referring Physician (Ophthalmology)  Extended Emergency Contact Information Primary Emergency Contact: Andrez Grime, [place] [address] [address]nette Litter of [place] Phone: [phone] Relation: Neighbor  Code Status: DNR Goals of Care: Advanced Directive information Advanced Directives 10/19/2016  Does Patient Have a Medical Advance Directive? Yes  Type of Advance Directive Out of facility DNR (pink MOST or yellow form);Healthcare Power of Attorney  Does patient want to make changes to medical advance directive? -  Copy of [place] in Chart? Yes  Pre-existing out of facility DNR order (yellow form or pink MOST form) Yellow form placed in chart (order not valid for inpatient use);Pink MOST form placed in chart (order not valid for inpatient use)   Chief Complaint  Patient presents with  . Annual Exam    wellness exam  . MMSE    28/30 visually impaired    HPI: Patient is a 84 y.o. female seen in today for an annual wellness exam.    She did get norovirus over the winter when they gave her a mask and she walked in the hallway.    Still enjoys her walks especially in halls.    Needs pneumovax, but it was not noted until after appt was over.  Depression screen [facility] 2/9 10/19/2016 04/20/2016  Decreased Interest 0 0  Down, Depressed, Hopeless 0 0  PHQ - 2 Score 0 0    Fall Risk  10/19/2016 04/20/2016  Falls in the past year? No No   MMSE - Mini Mental State Exam 10/19/2016  Orientation to time 5  Orientation to Place 5  Registration 3  Attention/ Calculation 5  Recall 3  Language- name 2 objects 2  Language- repeat 1  Language- follow 3 step command 3  Language- read & follow direction 1  Write a sentence 0  Copy design 0    Total score 28  is visually impaired so unable to draw pentagons or write sentence w/o her reader device  Health Maintenance  Topic Date Due  . TETANUS/TDAP  12/16/1950  . DEXA SCAN  12/17/1998  . PNA vac Low Risk Adult (2 of 2 - PPSV23) 06/07/2015  . INFLUENZA VACCINE  01/25/2017    Functional Status Survey: Is the patient deaf or have difficulty hearing?: (P) No Does the patient have difficulty seeing, even when wearing glasses/contacts?: (P) Yes Does the patient have difficulty concentrating, remembering, or making decisions?: (P) No Does the patient have difficulty walking or climbing stairs?: (P) No Does the patient have difficulty dressing or bathing?: (P) No Does the patient have difficulty doing errands alone such as visiting a doctor's office or shopping?: (P) Yes (due to visual impairment) Current Exercise Habits: (P) Home exercise routine   Diet?  Eats as healthily as possible here No exam data present  Is legally blind. Feels like vision worse every day and watery eyes are ongoing and problematic.   Hearing:  No problems Dentition:  No issues Pain:  No pain  Past Medical History:  Diagnosis Date  . Anxiety   . Arthritis   . Back pain   . Borderline diabetes   . Compression fracture of lumbar vertebra ([place]) 2013  . Constipation   . Displaced intertrochanteric fracture of left  femur ([place])   . Fracture of unspecified part of neck of unspecified femur, initial encounter for closed fracture ([place])   . GERD (gastroesophageal reflux disease)   . Hyperglycemia   . Hypo-osmolality and hyponatremia   . Macular degeneration    both eyes, blind  . Osteoporosis     Past Surgical History:  Procedure Laterality Date  . CATARACT EXTRACTION    . HIP FRACTURE SURGERY Left 2016  . INTRAMEDULLARY (IM) NAIL INTERTROCHANTERIC Left 03/24/2015   Procedure: INTRAMEDULLARY (IM) NAIL INTERTROCHANTRIC;  Surgeon: Rod Can, MD;  Location: [facility];  Service: Orthopedics;   Laterality: Left;  . TONSILLECTOMY      Family History  Problem Relation Age of Onset  . Cancer Mother     breast  . Heart disease Father     Social History   Social History  . Marital status: Single    Spouse name: N/A  . Number of children: N/A  . Years of education: N/A   Occupational History  . retired    Social History Main Topics  . Smoking status: Former Smoker    Quit date: 06/27/1965  . Smokeless tobacco: Never Used  . Alcohol use Yes     Comment: wine occasional  . Drug use: No  . Sexual activity: No   Other Topics Concern  . 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 She has never used smokeless tobacco. She reports that she drinks alcohol. She reports that she does not use drugs.  Allergies as of 10/19/2016   No Known Allergies     Medication List       Accurate as of 10/19/16 10:49 AM. Always use your most recent med list.          acetaminophen 500 MG tablet Commonly known as:  TYLENOL Take 1,000 mg by mouth every 6 (six) hours as needed. For pain.   cholecalciferol 1000 units tablet Commonly known as:  VITAMIN D Take 2,000 Units by mouth daily.   dorzolamide-timolol 22.3-6.8 MG/ML ophthalmic solution Commonly known as:  COSOPT Place 1 drop into the right eye 2 (two) times daily.   multivitamin with minerals Tabs tablet Take 1 tablet by mouth daily.   omega-3 acid ethyl esters 1 g capsule Commonly known as:  LOVAZA Take 1 g by mouth daily.   polyethylene glycol packet Commonly known as:  MIRALAX / GLYCOLAX Take 17 g by mouth daily.   TRAVATAN Z 0.004 % Soln  ophthalmic solution Generic drug:  Travoprost (BAK Free) Place 1 drop into the right eye at bedtime.       Review of Systems:  Review of Systems  Constitutional: Negative for chills, fever and malaise/fatigue.  HENT: Negative for congestion and hearing loss.   Eyes:       Visual impairment, legally blind  Respiratory: Negative for cough and shortness of breath.   Cardiovascular: Negative for chest pain and palpitations.  Gastrointestinal: Negative for abdominal pain, blood in stool, constipation and melena.       Drinks prune juice  each am  Genitourinary: Negative for dysuria, frequency and urgency.  Musculoskeletal: Negative for falls, joint pain and myalgias.  Skin: Negative for itching and rash.  Neurological: Negative for dizziness, loss of consciousness and weakness.  Endo/Heme/Allergies: Does not bruise/bleed easily.  Psychiatric/Behavioral: Negative for depression and memory loss. The patient is not nervous/anxious and does not have insomnia.     Physical Exam: Vitals:   10/19/16 0956  BP: 132/80  Pulse: 71  Temp: 98.7 F (37.1 C)  TempSrc: Oral  SpO2: 97%  Weight: 108 lb (49 kg)  Height: 5\' 10"  (1.778 m)   Body mass index is 15.5 kg/m. Physical Exam  Constitutional: She is oriented to person, place, and time. She appears well-developed and well-nourished. No distress.  HENT:  Head: Normocephalic and atraumatic.  Eyes:  Right eye deviates laterally  Cardiovascular: Normal rate, regular rhythm, normal heart sounds and intact distal pulses.   Pulmonary/Chest: Effort normal and breath sounds normal. No respiratory distress.  Abdominal: Soft. Bowel sounds are normal. She exhibits no distension.  Musculoskeletal: Normal range of motion. She exhibits no tenderness.  Walks with rolling walker with skis  Neurological: She is alert and oriented to person, place, and time.  Skin: Skin is warm and dry. Capillary refill takes less than 2 seconds.  Psychiatric: She has a  normal mood and affect.    Labs reviewed: Basic Metabolic Panel:  Recent Labs  04/21/16 0700  NA 137  K 4.4  BUN 13  CREATININE 0.4*   Liver Function Tests:  Recent Labs  04/21/16 0700  AST 16  ALT 9  ALKPHOS 93   No results for input(s): LIPASE, AMYLASE in the last 8760 hours. No results for input(s): AMMONIA in the last 8760 hours. CBC:  Recent Labs  04/21/16 0700  WBC 5.1  HGB 12.6  HCT 38  PLT 169   Lipid Panel: No results for input(s): CHOL, HDL, LDLCALC, TRIG, CHOLHDL, LDLDIRECT in the last 8760 hours. Lab Results  Component Value Date   HGBA1C 5.9 04/21/2016   Assessment/Plan 1. Medicare annual wellness visit, subsequent -needs pneumovax next time, had prevnar -needs bone density, but refuses due to not wanting more radiation or any of the meds for this  2. Senile osteoporosis -cont walking, vit D, ca with D and healthy balanced diet -refuses bone density and medication for her bones  3. Hyperglycemia -f/u hba1c before next visit, has been quite good  4. Blindness -ongoing, progressive, biggest problem for her  5. Closed pertrochanteric fracture of left femur with routine healing, sequela -walks with rolling walker with skis -doing quite well w/o pain  6. Hyponatremia -f/u cmp before next visit, had normalized last eval  Labs/tests ordered:  Cbc, cmp, hba1c before Next appt:  6 mos med mgt, labs before  Gordy Goar L. Shetara Launer, D.O. [facility] [address], [place] [address] Cell Phone (Mon-Fri 8am-5pm):  [phone] On Call:  [phone] & follow prompts after 5pm & weekends Office Phone:  [phone] Office Fax:  [phone]

## 2016-10-27 DIAGNOSIS — H31012 Macula scars of posterior pole (postinflammatory) (post-traumatic), left eye: Secondary | ICD-10-CM | POA: Diagnosis not present

## 2016-10-27 DIAGNOSIS — H353211 Exudative age-related macular degeneration, right eye, with active choroidal neovascularization: Secondary | ICD-10-CM | POA: Diagnosis not present

## 2016-11-30 ENCOUNTER — Encounter: Payer: Self-pay | Admitting: Internal Medicine

## 2016-12-15 DIAGNOSIS — H401114 Primary open-angle glaucoma, right eye, indeterminate stage: Secondary | ICD-10-CM | POA: Diagnosis not present

## 2016-12-15 DIAGNOSIS — H353211 Exudative age-related macular degeneration, right eye, with active choroidal neovascularization: Secondary | ICD-10-CM | POA: Diagnosis not present

## 2016-12-15 DIAGNOSIS — Z961 Presence of intraocular lens: Secondary | ICD-10-CM | POA: Diagnosis not present

## 2016-12-15 DIAGNOSIS — H31012 Macula scars of posterior pole (postinflammatory) (post-traumatic), left eye: Secondary | ICD-10-CM | POA: Diagnosis not present

## 2017-01-26 DIAGNOSIS — H353211 Exudative age-related macular degeneration, right eye, with active choroidal neovascularization: Secondary | ICD-10-CM | POA: Diagnosis not present

## 2017-03-16 DIAGNOSIS — H353211 Exudative age-related macular degeneration, right eye, with active choroidal neovascularization: Secondary | ICD-10-CM | POA: Diagnosis not present

## 2017-04-14 DIAGNOSIS — D649 Anemia, unspecified: Secondary | ICD-10-CM | POA: Diagnosis not present

## 2017-04-14 DIAGNOSIS — R739 Hyperglycemia, unspecified: Secondary | ICD-10-CM | POA: Diagnosis not present

## 2017-04-14 DIAGNOSIS — M816 Localized osteoporosis [Lequesne]: Secondary | ICD-10-CM | POA: Diagnosis not present

## 2017-04-14 DIAGNOSIS — E871 Hypo-osmolality and hyponatremia: Secondary | ICD-10-CM | POA: Diagnosis not present

## 2017-04-14 DIAGNOSIS — E119 Type 2 diabetes mellitus without complications: Secondary | ICD-10-CM | POA: Diagnosis not present

## 2017-04-14 LAB — CBC AND DIFFERENTIAL
HCT: 41 (ref 36–46)
Hemoglobin: 13.5 (ref 12.0–16.0)
Platelets: 226 (ref 150–399)
WBC: 4.7

## 2017-04-14 LAB — BASIC METABOLIC PANEL
BUN: 20 (ref 4–21)
BUN: 20 (ref 4–21)
Creatinine: 0.4 — AB (ref 0.5–1.1)
Creatinine: 0.4 — AB (ref 0.5–1.1)
Glucose: 134
Glucose: 134
Potassium: 4.6 (ref 3.4–5.3)
Potassium: 4.6 (ref 3.4–5.3)
Sodium: 139 (ref 137–147)
Sodium: 139 (ref 137–147)

## 2017-04-14 LAB — HEPATIC FUNCTION PANEL
ALT: 13 (ref 7–35)
ALT: 13 (ref 7–35)
AST: 19 (ref 13–35)
AST: 19 (ref 13–35)
Alkaline Phosphatase: 90 (ref 25–125)
Alkaline Phosphatase: 90 (ref 25–125)
Bilirubin, Total: 0.4
Bilirubin, Total: 0.4

## 2017-04-14 LAB — HEMOGLOBIN A1C: Hemoglobin A1C: 4.8

## 2017-04-17 DIAGNOSIS — E871 Hypo-osmolality and hyponatremia: Secondary | ICD-10-CM | POA: Diagnosis not present

## 2017-04-17 DIAGNOSIS — D649 Anemia, unspecified: Secondary | ICD-10-CM | POA: Diagnosis not present

## 2017-04-17 LAB — BASIC METABOLIC PANEL
BUN: 14 (ref 4–21)
Creatinine: 0.4 — AB (ref 0.5–1.1)
Glucose: 128
Potassium: 5 (ref 3.4–5.3)
Sodium: 138 (ref 137–147)

## 2017-04-18 ENCOUNTER — Encounter: Payer: Self-pay | Admitting: Internal Medicine

## 2017-04-18 NOTE — Progress Notes (Signed)
A user error has taken place.

## 2017-04-19 ENCOUNTER — Encounter: Payer: Self-pay | Admitting: Internal Medicine

## 2017-04-20 DIAGNOSIS — Z23 Encounter for immunization: Secondary | ICD-10-CM | POA: Diagnosis not present

## 2017-04-26 ENCOUNTER — Encounter: Payer: Self-pay | Admitting: Internal Medicine

## 2017-04-26 ENCOUNTER — Non-Acute Institutional Stay: Payer: Medicare Other | Admitting: Internal Medicine

## 2017-04-26 VITALS — BP 148/80 | HR 76 | Temp 98.7°F | Wt 107.0 lb

## 2017-04-26 DIAGNOSIS — E871 Hypo-osmolality and hyponatremia: Secondary | ICD-10-CM

## 2017-04-26 DIAGNOSIS — M810 Age-related osteoporosis without current pathological fracture: Secondary | ICD-10-CM

## 2017-04-26 DIAGNOSIS — R739 Hyperglycemia, unspecified: Secondary | ICD-10-CM | POA: Diagnosis not present

## 2017-04-26 DIAGNOSIS — H547 Unspecified visual loss: Secondary | ICD-10-CM | POA: Diagnosis not present

## 2017-04-26 NOTE — Progress Notes (Signed)
Location:  Occupational psychologist of Service:  Clinic (12)  Provider: Ishani Goldwasser L. Mariea Clonts, D.O., C.M.D.  Code Status: DNR Goals of Care:  Advanced Directives 04/26/2017  Does Patient Have a Medical Advance Directive? Yes  Type of Advance Directive Out of facility DNR (pink MOST or yellow form);Healthcare Power of Attorney  Does patient want to make changes to medical advance directive? -  Copy of [place] in Chart? Yes  Pre-existing out of facility DNR order (yellow form or pink MOST form) Yellow form placed in chart (order not valid for inpatient use);Pink MOST form placed in chart (order not valid for inpatient use)   Chief Complaint  Patient presents with  . Medical Management of Chronic Issues    77mth follow-up    HPI: Patient is a 83 y.o. female seen today for medical management of chronic diseases.    Still stressing with issues with her house.  She's going through paperwork when she visits.      Labs reviewed and normal.  hba1c 4.9.  Apparently she was a hard stick this last time.  Admits she does not drink 8 glasses of water per day.  She stops early in the evening.  She is trying to drink more in the am unless she is going out for several hours.    She is feeling fine except for worrying a lot.    Feels tired if she stands a lot, not with walking.  No actual pain in the back.  Also goes to the exercise machines as well as walks regularly.  Continues on vitamin D.  Has to get up once at night to urinate.  Has difficulty falling back to sleep after that.  Stops drinking anything around 6:30pm.  Knows it has to do with her age.   Past Medical History:  Diagnosis Date  . Anxiety   . Arthritis   . Back pain   . Borderline diabetes   . Compression fracture of lumbar vertebra ([place]) 2013  . Constipation   . Displaced intertrochanteric fracture of left femur ([place])   . Fracture of unspecified part of neck of unspecified femur, initial  encounter for closed fracture ([place])   . GERD (gastroesophageal reflux disease)   . Hyperglycemia   . Hypo-osmolality and hyponatremia   . Macular degeneration    both eyes, blind  . Osteoporosis     Past Surgical History:  Procedure Laterality Date  . CATARACT EXTRACTION    . HIP FRACTURE SURGERY Left 2016  . INTRAMEDULLARY (IM) NAIL INTERTROCHANTERIC Left 03/24/2015   Procedure: INTRAMEDULLARY (IM) NAIL INTERTROCHANTRIC;  Surgeon: Rod Can, MD;  Location: [facility];  Service: Orthopedics;  Laterality: Left;  . TONSILLECTOMY      No Known Allergies  Outpatient Encounter Prescriptions as of 04/26/2017  Medication Sig  . acetaminophen (TYLENOL) 500 MG tablet Take 1,000 mg by mouth every 6 (six) hours as needed. For pain.  . cholecalciferol (VITAMIN D) 1000 UNITS tablet Take 2,000 Units by mouth daily.  . dorzolamide-timolol (COSOPT) 22.3-6.8 MG/ML ophthalmic solution Place 1 drop into the right eye 2 (two) times daily.  . Multiple Vitamin (MULITIVITAMIN WITH MINERALS) TABS Take 1 tablet by mouth daily.  Marland Kitchen omega-3 acid ethyl esters (LOVAZA) 1 G capsule Take 1 g by mouth daily.  . TRAVATAN Z 0.004 % SOLN ophthalmic solution Place 1 drop into the right eye at bedtime.   . [DISCONTINUED] polyethylene glycol (MIRALAX / GLYCOLAX) packet Take 17 g  by mouth daily.   No facility-administered encounter medications on file as of 04/26/2017.     Review of Systems:  Review of Systems  Constitutional: Positive for malaise/fatigue. Negative for chills, fever and weight loss.  HENT: Negative for congestion and hearing loss.   Eyes:       Legally blind  Respiratory: Negative for cough and shortness of breath.   Cardiovascular: Negative for chest pain, palpitations and leg swelling.  Gastrointestinal: Negative for abdominal pain, blood in stool, constipation and melena.  Genitourinary: Negative for dysuria and urgency.       1x nocturia  Musculoskeletal: Negative for back pain and falls.    Skin: Negative for itching and rash.  Neurological: Negative for dizziness, loss of consciousness, weakness and headaches.  Endo/Heme/Allergies: Does not bruise/bleed easily.  Psychiatric/Behavioral: Negative for depression and memory loss. The patient is nervous/anxious. The patient does not have insomnia.     Health Maintenance  Topic Date Due  . TETANUS/TDAP  12/16/1950  . PNA vac Low Risk Adult (2 of 2 - PPSV23) 06/07/2015  . INFLUENZA VACCINE  01/25/2017  . DEXA SCAN  10/25/2023 (Originally 12/17/1998)    Physical Exam: Vitals:   04/26/17 1113  BP: (!) 148/80  Pulse: 76  Temp: 98.7 F (37.1 C)  TempSrc: Oral  SpO2: 98%  Weight: 107 lb (48.5 kg)   Body mass index is 15.35 kg/m. Physical Exam  Constitutional: She is oriented to person, place, and time. She appears well-developed and well-nourished. No distress.  HENT:  Head: Normocephalic and atraumatic.  Excellent hearing  Eyes:  blind  Cardiovascular: Normal rate, regular rhythm, normal heart sounds and intact distal pulses.  Pulmonary/Chest: Effort normal and breath sounds normal. No respiratory distress.  Abdominal: Soft. Bowel sounds are normal. She exhibits no distension. There is no tenderness.  Musculoskeletal: Normal range of motion.  Walks with rolling walker  Neurological: She is alert and oriented to person, place, and time.  Skin: Skin is warm and dry. Capillary refill takes less than 2 seconds.  Psychiatric: She has a normal mood and affect. Her behavior is normal. Judgment and thought content normal.    Labs reviewed: Basic Metabolic Panel:  Recent Labs  04/14/17 0600 04/17/17 0300  NA 139 138  K 4.6 5.0  BUN 20 14  CREATININE 0.4* 0.4*   Liver Function Tests:  Recent Labs  04/14/17 0600  AST 19  ALT 13  ALKPHOS 90   No results for input(s): LIPASE, AMYLASE in the last 8760 hours. No results for input(s): AMMONIA in the last 8760 hours. CBC:  Recent Labs  04/14/17 0600  WBC  4.7  HGB 13.5  HCT 41  PLT 226   Lipid Panel: No results for input(s): CHOL, HDL, LDLCALC, TRIG, CHOLHDL, LDLDIRECT in the last 8760 hours. Lab Results  Component Value Date   HGBA1C 4.8 04/14/2017    Assessment/Plan 1. Senile osteoporosis -refuses bone density, calcium and medication outside of vitamin D3, continue weightbearing exercise which she's excellent about doing  2. Hyperglycemia -has been under great control and not even near diabetic range now  3. Hyponatremia -has had this historically, but Na normal recently  4. Blindness -reason for move to [place] plus she'd had a hip fx at that time and transferred from another SNF -unchanged, remains very active  Labs/tests ordered:  Bmp Next appt:  6 mos med mgt  Byren Pankow L. Kelsa Jaworowski, D.O. [facility] [address] [place], [place]  [address] Cell Phone (Mon-Fri 8am-5pm):  [phone] On Call:  [phone] & follow prompts after 5pm & weekends Office Phone:  [phone] Office Fax:  [phone]

## 2017-05-02 ENCOUNTER — Encounter: Payer: Self-pay | Admitting: Internal Medicine

## 2017-05-04 DIAGNOSIS — H353211 Exudative age-related macular degeneration, right eye, with active choroidal neovascularization: Secondary | ICD-10-CM | POA: Diagnosis not present

## 2017-06-15 DIAGNOSIS — H353231 Exudative age-related macular degeneration, bilateral, with active choroidal neovascularization: Secondary | ICD-10-CM | POA: Diagnosis not present

## 2017-06-15 DIAGNOSIS — H35361 Drusen (degenerative) of macula, right eye: Secondary | ICD-10-CM | POA: Diagnosis not present

## 2017-06-15 DIAGNOSIS — H43812 Vitreous degeneration, left eye: Secondary | ICD-10-CM | POA: Diagnosis not present

## 2017-06-15 DIAGNOSIS — H353211 Exudative age-related macular degeneration, right eye, with active choroidal neovascularization: Secondary | ICD-10-CM | POA: Diagnosis not present

## 2017-07-27 DIAGNOSIS — H353231 Exudative age-related macular degeneration, bilateral, with active choroidal neovascularization: Secondary | ICD-10-CM | POA: Diagnosis not present

## 2017-07-27 DIAGNOSIS — H353211 Exudative age-related macular degeneration, right eye, with active choroidal neovascularization: Secondary | ICD-10-CM | POA: Diagnosis not present

## 2017-09-07 DIAGNOSIS — H401114 Primary open-angle glaucoma, right eye, indeterminate stage: Secondary | ICD-10-CM | POA: Diagnosis not present

## 2017-09-07 DIAGNOSIS — H31012 Macula scars of posterior pole (postinflammatory) (post-traumatic), left eye: Secondary | ICD-10-CM | POA: Diagnosis not present

## 2017-09-07 DIAGNOSIS — H353211 Exudative age-related macular degeneration, right eye, with active choroidal neovascularization: Secondary | ICD-10-CM | POA: Diagnosis not present

## 2017-09-07 DIAGNOSIS — Z961 Presence of intraocular lens: Secondary | ICD-10-CM | POA: Diagnosis not present

## 2017-10-25 ENCOUNTER — Encounter: Payer: Self-pay | Admitting: Internal Medicine

## 2017-10-25 ENCOUNTER — Non-Acute Institutional Stay: Payer: Medicare Other | Admitting: Internal Medicine

## 2017-10-25 VITALS — BP 120/70 | HR 63 | Temp 99.1°F | Ht 70.0 in | Wt 103.0 lb

## 2017-10-25 DIAGNOSIS — H547 Unspecified visual loss: Secondary | ICD-10-CM | POA: Diagnosis not present

## 2017-10-25 DIAGNOSIS — E871 Hypo-osmolality and hyponatremia: Secondary | ICD-10-CM | POA: Diagnosis not present

## 2017-10-25 DIAGNOSIS — R739 Hyperglycemia, unspecified: Secondary | ICD-10-CM

## 2017-10-25 DIAGNOSIS — M810 Age-related osteoporosis without current pathological fracture: Secondary | ICD-10-CM

## 2017-10-25 DIAGNOSIS — T50Z95A Adverse effect of other vaccines and biological substances, initial encounter: Secondary | ICD-10-CM | POA: Diagnosis not present

## 2017-10-25 NOTE — Progress Notes (Signed)
Location:  Occupational psychologist of Service:  Clinic (12)  Provider: Vaishnavi Dalby L. Mariea Clonts, D.O., C.M.D.  Code Status: DNR Goals of Care:  Advanced Directives 10/25/2017  Does Patient Have a Medical Advance Directive? Yes  Type of Paramedic of [place];Out of facility DNR (pink MOST or yellow form)  Does patient want to make changes to medical advance directive? No - Patient declined  Copy of [place] in Chart? Yes  Pre-existing out of facility DNR order (yellow form or pink MOST form) Yellow form placed in chart (order not valid for inpatient use);Pink MOST form placed in chart (order not valid for inpatient use)   Chief Complaint  Patient presents with  . Medical Management of Chronic Issues    6MTH FOLLOW-UP    HPI: Patient is a 84 y.o. female seen today for medical management of chronic diseases.    Had shingles itself several years ago around her waist.  Had waited several days.  Fortunately that was mild.  Got original shingles vaccine.  Now told about new one that was more beneficial and effective.  Finally she could get it here and signed up.  First shingrix was 07/22/17 without a single problem, second shingrix was 10/20/17 (sent by pharmacy 10/03/17).  Got the second one about 7:30pm and it was fine.  Around 10pm, her arm started to hurt and it was awful by midnight.  Achy all over.  Went to bed.  She hurt so badly that she was nauseous.  Went to restroom which was like torture.  Worst pain she's ever had.  Was lying there and really sick and couldn't reach call bell wrapped around light post.  Nursing came in in the am and at 10:19am VS:  T100.5, HR 120, RR 22 and POX 96% and BP 127/101 but repeat bp by nurse a few minutes later was 112/66.  Took tylenol and some water.  She finally ate a meal 4/27 evening and then slept from 8pm into Sunday and did fine since.  Appetite still not fabulous.    Still struggling with her house.   She's going to get the house items appraised and put on the market. Needs cleaning service, etc.  That continues to weigh on her.    Tomorrow, she has an eye injection.  Continues on travatan, cosopt.   Osteoporosis:  Remains on vitamin D and does weightbearing exercise.  Has refused consistently f/u bone densities and medication for her bones.    She asks about measles and whether repeat vaccination is needed.  She is certainly born before 1955 so CDC guidelines deem her immune (plus she had her childhood vaccines).    Past Medical History:  Diagnosis Date  . Anxiety   . Arthritis   . Back pain   . Borderline diabetes   . Compression fracture of lumbar vertebra ([place]) 2013  . Constipation   . Displaced intertrochanteric fracture of left femur ([place])   . Fracture of unspecified part of neck of unspecified femur, initial encounter for closed fracture ([place])   . GERD (gastroesophageal reflux disease)   . Hyperglycemia   . Hypo-osmolality and hyponatremia   . Macular degeneration    both eyes, blind  . Osteoporosis     Past Surgical History:  Procedure Laterality Date  . CATARACT EXTRACTION    . HIP FRACTURE SURGERY Left 2016  . INTRAMEDULLARY (IM) NAIL INTERTROCHANTERIC Left 03/24/2015   Procedure: INTRAMEDULLARY (IM) NAIL INTERTROCHANTRIC;  Surgeon: Rod Can, MD;  Location: [facility];  Service: Orthopedics;  Laterality: Left;  . TONSILLECTOMY      No Known Allergies  Outpatient Encounter Medications as of 10/25/2017  Medication Sig  . acetaminophen (TYLENOL) 500 MG tablet Take 1,000 mg by mouth every 6 (six) hours as needed. For pain.  . cholecalciferol (VITAMIN D) 1000 UNITS tablet Take 2,000 Units by mouth daily.  . dorzolamide-timolol (COSOPT) 22.3-6.8 MG/ML ophthalmic solution Place 1 drop into the right eye 2 (two) times daily.  . Multiple Vitamin (MULITIVITAMIN WITH MINERALS) TABS Take 1 tablet by mouth daily.  Marland Kitchen omega-3 acid ethyl esters (LOVAZA) 1 G capsule Take 1 g by  mouth daily.  . TRAVATAN Z 0.004 % SOLN ophthalmic solution Place 1 drop into the right eye at bedtime.    No facility-administered encounter medications on file as of 10/25/2017.     Review of Systems:  Review of Systems  Constitutional: Negative for chills, fever and malaise/fatigue.       Had fever after shingrix #2  HENT: Negative for hearing loss.   Eyes: Positive for blurred vision.       Legally blind  Respiratory: Negative for shortness of breath.   Cardiovascular: Negative for chest pain, palpitations and leg swelling.  Gastrointestinal: Negative for abdominal pain, blood in stool, constipation and melena.  Genitourinary: Negative for dysuria.  Musculoskeletal: Negative for joint pain and myalgias.  Neurological: Negative for dizziness and loss of consciousness.  Psychiatric/Behavioral: Negative for depression and memory loss. The patient is not nervous/anxious and does not have insomnia.     Health Maintenance  Topic Date Due  . TETANUS/TDAP  12/16/1950  . PNA vac Low Risk Adult (2 of 2 - PPSV23) 06/07/2015  . DEXA SCAN  10/25/2023 (Originally 12/17/1998)  . INFLUENZA VACCINE  01/25/2018    Physical Exam: Vitals:   10/25/17 0943  BP: 120/70  Pulse: 63  Temp: 99.1 F (37.3 C)  TempSrc: Oral  SpO2: 97%  Weight: 103 lb (46.7 kg)  Height: 5\' 10"  (1.778 m)   Body mass index is 14.78 kg/m. Physical Exam  Constitutional: She is oriented to person, place, and time. She appears well-developed and well-nourished. No distress.  Thin, walks with rolling walker  Eyes:  Legally blind  Cardiovascular: Normal rate, regular rhythm, normal heart sounds and intact distal pulses.  Pulmonary/Chest: Effort normal and breath sounds normal. No respiratory distress.  Abdominal: Bowel sounds are normal.  Musculoskeletal: Normal range of motion.  Neurological: She is alert and oriented to person, place, and time.  Skin: Skin is warm and dry. Capillary refill takes less than 2  seconds.  Psychiatric: She has a normal mood and affect.    Labs reviewed: Basic Metabolic Panel: Recent Labs    04/14/17 0600 04/17/17 0300  NA 139  139 138  K 4.6  4.6 5.0  BUN 20  20 14   CREATININE 0.4*  0.4* 0.4*   Liver Function Tests: Recent Labs    04/14/17 0600  AST 19  19  ALT 13  13  ALKPHOS 90  90   No results for input(s): LIPASE, AMYLASE in the last 8760 hours. No results for input(s): AMMONIA in the last 8760 hours. CBC: Recent Labs    04/14/17 0600  WBC 4.7  HGB 13.5  HCT 41  PLT 226   Lipid Panel: No results for input(s): CHOL, HDL, LDLCALC, TRIG, CHOLHDL, LDLDIRECT in the last 8760 hours. Lab Results  Component Value Date  HGBA1C 4.8 04/14/2017    Procedures since last visit: No results found.  Assessment/Plan 1. Immunization reaction, initial encounter -to shingrix #2, see above  2. Senile osteoporosis -cont vitamin D and weightbearing exercise, refuses bone densities and meds  3. Hyperglycemia -does great with her diet and hba1c has been normal lately  4. Hyponatremia -has been normal on latest labs, check 1-2x per year  5. Blindness -ongoing, has eye injection coming up with ophtho -does very well   Labs/tests ordered:  Hba1c, flp, cbc, cmp before Next appt:  6 mos for CPE, labs before  Senai Kingsley L. Shayleigh Bouldin, D.O. [facility] [address], [place] [address] Cell Phone (Mon-Fri 8am-5pm):  [phone] On Call:  [phone] & follow prompts after 5pm & weekends Office Phone:  [phone] Office Fax:  [phone]

## 2017-10-26 DIAGNOSIS — H353211 Exudative age-related macular degeneration, right eye, with active choroidal neovascularization: Secondary | ICD-10-CM | POA: Diagnosis not present

## 2017-10-26 DIAGNOSIS — H353231 Exudative age-related macular degeneration, bilateral, with active choroidal neovascularization: Secondary | ICD-10-CM | POA: Diagnosis not present

## 2017-12-04 ENCOUNTER — Telehealth: Payer: Self-pay | Admitting: Internal Medicine

## 2017-12-04 NOTE — Telephone Encounter (Signed)
I left a message asking the pt to call me at [phone] to schedule AWV with Sara at [facility] on 12/12/17 if available. VDM (DD) °

## 2017-12-12 ENCOUNTER — Non-Acute Institutional Stay: Payer: Medicare Other

## 2017-12-12 VITALS — BP 140/60 | HR 87 | Temp 98.7°F | Ht 70.0 in | Wt 104.0 lb

## 2017-12-12 DIAGNOSIS — Z0000 Encounter for general adult medical examination without abnormal findings: Secondary | ICD-10-CM

## 2017-12-12 NOTE — Patient Instructions (Signed)
Krystal [name] , Thank you for taking time to come for your Medicare Wellness Visit. I appreciate your ongoing commitment to your health goals. Please review the following plan we discussed and let me know if I can assist you in the future.   Screening recommendations/referrals: Colonoscopy excluded, over age 82 Mammogram excluded, over age 37 Bone Density due, declined Recommended yearly ophthalmology/optometry visit for glaucoma screening and checkup Recommended yearly dental visit for hygiene and checkup  Vaccinations: Influenza vaccine up to date, due 2019 fall season Pneumococcal vaccine. Please check with old pharmacy if you ever got the Pneumovax shot. Let Dr. Mariea Clonts know the date Tdap vaccine due. If you decide to get this [facility] can give it Shingles vaccine up to date, completed    Advanced directives: in chart  Conditions/risks identified: none  Next appointment: Dr. Mariea Clonts 05/02/2018 @ 10am   Preventive Care 65 Years and Older, Female Preventive care refers to lifestyle choices and visits with your health care provider that can promote health and wellness. What does preventive care include?  A yearly physical exam. This is also called an annual well check.  Dental exams once or twice a year.  Routine eye exams. Ask your health care provider how often you should have your eyes checked.  Personal lifestyle choices, including:  Daily care of your teeth and gums.  Regular physical activity.  Eating a healthy diet.  Avoiding tobacco and drug use.  Limiting alcohol use.  Practicing safe sex.  Taking low-dose aspirin every day.  Taking vitamin and mineral supplements as recommended by your health care provider. What happens during an annual well check? The services and screenings done by your health care provider during your annual well check will depend on your age, overall health, lifestyle risk factors, and family history of disease. Counseling  Your health care  provider may ask you questions about your:  Alcohol use.  Tobacco use.  Drug use.  Emotional well-being.  Home and relationship well-being.  Sexual activity.  Eating habits.  History of falls.  Memory and ability to understand (cognition).  Work and work Statistician.  Reproductive health. Screening  You may have the following tests or measurements:  Height, weight, and BMI.  Blood pressure.  Lipid and cholesterol levels. These may be checked every 5 years, or more frequently if you are over 32 years old.  Skin check.  Lung cancer screening. You may have this screening every year starting at age 89 if you have a 30-pack-year history of smoking and currently smoke or have quit within the past 15 years.  Fecal occult blood test (FOBT) of the stool. You may have this test every year starting at age 33.  Flexible sigmoidoscopy or colonoscopy. You may have a sigmoidoscopy every 5 years or a colonoscopy every 10 years starting at age 80.  Hepatitis C blood test.  Hepatitis B blood test.  Sexually transmitted disease (STD) testing.  Diabetes screening. This is done by checking your blood sugar (glucose) after you have not eaten for a while (fasting). You may have this done every 1-3 years.  Bone density scan. This is done to screen for osteoporosis. You may have this done starting at age 63.  Mammogram. This may be done every 1-2 years. Talk to your health care provider about how often you should have regular mammograms. Talk with your health care provider about your test results, treatment options, and if necessary, the need for more tests. Vaccines  Your health care provider may  recommend certain vaccines, such as:  Influenza vaccine. This is recommended every year.  Tetanus, diphtheria, and acellular pertussis (Tdap, Td) vaccine. You may need a Td booster every 10 years.  Zoster vaccine. You may need this after age 38.  Pneumococcal 13-valent conjugate (PCV13)  vaccine. One dose is recommended after age 8.  Pneumococcal polysaccharide (PPSV23) vaccine. One dose is recommended after age 27. Talk to your health care provider about which screenings and vaccines you need and how often you need them. This information is not intended to replace advice given to you by your health care provider. Make sure you discuss any questions you have with your health care provider. Document Released: 07/10/2015 Document Revised: 03/02/2016 Document Reviewed: 04/14/2015 Elsevier Interactive Patient Education  2017 [place] Prevention in the Home Falls can cause injuries. They can happen to people of all ages. There are many things you can do to make your home safe and to help prevent falls. What can I do on the outside of my home?  Regularly fix the edges of walkways and driveways and fix any cracks.  Remove anything that might make you trip as you walk through a door, such as a raised step or threshold.  Trim any bushes or trees on the path to your home.  Use bright outdoor lighting.  Clear any walking paths of anything that might make someone trip, such as rocks or tools.  Regularly check to see if handrails are loose or broken. Make sure that both sides of any steps have handrails.  Any raised decks and porches should have guardrails on the edges.  Have any leaves, snow, or ice cleared regularly.  Use sand or salt on walking paths during winter.  Clean up any spills in your garage right away. This includes oil or grease spills. What can I do in the bathroom?  Use night lights.  Install grab bars by the toilet and in the tub and shower. Do not use towel bars as grab bars.  Use non-skid mats or decals in the tub or shower.  If you need to sit down in the shower, use a plastic, non-slip stool.  Keep the floor dry. Clean up any water that spills on the floor as soon as it happens.  Remove soap buildup in the tub or shower  regularly.  Attach bath mats securely with double-sided non-slip rug tape.  Do not have throw rugs and other things on the floor that can make you trip. What can I do in the bedroom?  Use night lights.  Make sure that you have a light by your bed that is easy to reach.  Do not use any sheets or blankets that are too big for your bed. They should not hang down onto the floor.  Have a firm chair that has side arms. You can use this for support while you get dressed.  Do not have throw rugs and other things on the floor that can make you trip. What can I do in the kitchen?  Clean up any spills right away.  Avoid walking on wet floors.  Keep items that you use a lot in easy-to-reach places.  If you need to reach something above you, use a strong step stool that has a grab bar.  Keep electrical cords out of the way.  Do not use floor polish or wax that makes floors slippery. If you must use wax, use non-skid floor wax.  Do not have throw rugs and  other things on the floor that can make you trip. What can I do with my stairs?  Do not leave any items on the stairs.  Make sure that there are handrails on both sides of the stairs and use them. Fix handrails that are broken or loose. Make sure that handrails are as long as the stairways.  Check any carpeting to make sure that it is firmly attached to the stairs. Fix any carpet that is loose or worn.  Avoid having throw rugs at the top or bottom of the stairs. If you do have throw rugs, attach them to the floor with carpet tape.  Make sure that you have a light switch at the top of the stairs and the bottom of the stairs. If you do not have them, ask someone to add them for you. What else can I do to help prevent falls?  Wear shoes that:  Do not have high heels.  Have rubber bottoms.  Are comfortable and fit you well.  Are closed at the toe. Do not wear sandals.  If you use a stepladder:  Make sure that it is fully  opened. Do not climb a closed stepladder.  Make sure that both sides of the stepladder are locked into place.  Ask someone to hold it for you, if possible.  Clearly mark and make sure that you can see:  Any grab bars or handrails.  First and last steps.  Where the edge of each step is.  Use tools that help you move around (mobility aids) if they are needed. These include:  Canes.  Walkers.  Scooters.  Crutches.  Turn on the lights when you go into a dark area. Replace any light bulbs as soon as they burn out.  Set up your furniture so you have a clear path. Avoid moving your furniture around.  If any of your floors are uneven, fix them.  If there are any pets around you, be aware of where they are.  Review your medicines with your doctor. Some medicines can make you feel dizzy. This can increase your chance of falling. Ask your doctor what other things that you can do to help prevent falls. This information is not intended to replace advice given to you by your health care provider. Make sure you discuss any questions you have with your health care provider. Document Released: 04/09/2009 Document Revised: 11/19/2015 Document Reviewed: 07/18/2014 Elsevier Interactive Patient Education  2017 [employer].

## 2017-12-12 NOTE — Progress Notes (Signed)
Subjective:   Krystal [name] is a 84 y.o. female who presents for Medicare Annual (Subsequent) preventive examination at [facility]  Last AWV-10/19/2016       Objective:     Vitals: BP 140/60 (BP Location: Right Arm, Patient Position: Sitting)   Pulse 87   Temp 98.7 F (37.1 C) (Oral)   Ht 5\' 10"  (1.778 m)   Wt 104 lb (47.2 kg)   SpO2 94%   BMI 14.92 kg/m   Body mass index is 14.92 kg/m.  Advanced Directives 12/12/2017 10/25/2017 04/26/2017 10/19/2016 04/20/2016 06/01/2015 05/19/2015  Does Patient Have a Medical Advance Directive? Yes Yes Yes Yes Yes Yes Yes  Type of Paramedic of [place];Out of facility DNR (pink MOST or yellow form) [place];Out of facility DNR (pink MOST or yellow form) Out of facility DNR (pink MOST or yellow form);Healthcare Power of [employer] of facility DNR (pink MOST or yellow form);Healthcare Power of [employer] of facility DNR (pink MOST or yellow form);Healthcare Power of [place];Living will [place];Out of facility DNR (pink MOST or yellow form)  Does patient want to make changes to medical advance directive? No - Patient declined No - Patient declined - - - No - Patient declined -  Copy of [place] in Chart? Yes Yes Yes Yes Yes Yes Yes  Pre-existing out of facility DNR order (yellow form or pink MOST form) Yellow form placed in chart (order not valid for inpatient use) Yellow form placed in chart (order not valid for inpatient use);Pink MOST form placed in chart (order not valid for inpatient use) Yellow form placed in chart (order not valid for inpatient use);Pink MOST form placed in chart (order not valid for inpatient use) Yellow form placed in chart (order not valid for inpatient use);Pink MOST form placed in chart (order not valid for inpatient use) Yellow form placed in chart (order not valid for inpatient use);Pink  MOST form placed in chart (order not valid for inpatient use) Pink MOST form placed in chart (order not valid for inpatient use);Yellow form placed in chart (order not valid for inpatient use) Pink MOST form placed in chart (order not valid for inpatient use)    Tobacco Social History   Tobacco Use  Smoking Status Former Smoker  . Last attempt to quit: 06/27/1965  . Years since quitting: 52.4  Smokeless Tobacco Never Used     Counseling given: Not Answered   Clinical Intake:  Pre-visit preparation completed: No  Pain : No/denies pain     Nutritional Risks: None Diabetes: No  How often do you need to have someone help you when you read instructions, pamphlets, or other written materials from your doctor or pharmacy?: 4 - Often(macular degeneration) What is the last grade level you completed in school?: College  Interpreter Needed?: No  Information entered by :: Tyson Dense, RN  Past Medical History:  Diagnosis Date  . Anxiety   . Arthritis   . Back pain   . Borderline diabetes   . Compression fracture of lumbar vertebra ([place]) 2013  . Constipation   . Displaced intertrochanteric fracture of left femur ([place])   . Fracture of unspecified part of neck of unspecified femur, initial encounter for closed fracture ([place])   . GERD (gastroesophageal reflux disease)   . Hyperglycemia   . Hypo-osmolality and hyponatremia   . Macular degeneration    both eyes, blind  .  Osteoporosis    Past Surgical History:  Procedure Laterality Date  . CATARACT EXTRACTION    . HIP FRACTURE SURGERY Left 2016  . INTRAMEDULLARY (IM) NAIL INTERTROCHANTERIC Left 03/24/2015   Procedure: INTRAMEDULLARY (IM) NAIL INTERTROCHANTRIC;  Surgeon: Rod Can, MD;  Location: [facility];  Service: Orthopedics;  Laterality: Left;  . TONSILLECTOMY     Family History  Problem Relation Age of Onset  . Cancer Mother        breast  . Heart disease Father    Social History   Socioeconomic History  . Marital  status: Single    Spouse name: Not on file  . Number of children: Not on file  . Years of education: Not on file  . Highest education level: Not on file  Occupational History  . Occupation: retired  Scientific laboratory technician  . Financial resource strain: Not hard at all  . Food insecurity:    Worry: Never true    Inability: Never true  . Transportation needs:    Medical: No    Non-medical: No  Tobacco Use  . Smoking status: Former Smoker    Last attempt to quit: 06/27/1965    Years since quitting: 52.4  . Smokeless tobacco: Never Used  Substance and Sexual Activity  . Alcohol use: Never    Frequency: Never    Comment: wine occasional  . Drug use: No  . Sexual activity: Never  Lifestyle  . Physical activity:    Days per week: 7 days    Minutes per session: 60 min  . Stress: To some extent  Relationships  . Social connections:    Talks on phone: More than three times a week    Gets together: More than three times a week    Attends religious service: Never    Active member of club or organization: No    Attends meetings of clubs or organizations: Never    Relationship status: Never married  Other Topics Concern  . 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 acetaminophen (TYLENOL) 500 MG tablet Take 1,000 mg by mouth every 6 (six) hours as needed. For pain.  . cholecalciferol (VITAMIN D) 1000 UNITS tablet Take 2,000 Units by mouth daily.  . dorzolamide-timolol (COSOPT) 22.3-6.8 MG/ML ophthalmic solution Place 1 drop into the right eye 2 (two) times daily.  . Multiple Vitamin (MULITIVITAMIN  WITH MINERALS) TABS Take 1 tablet by mouth daily.  . TRAVATAN Z 0.004 % SOLN ophthalmic solution Place 1 drop into the right eye at bedtime.   Marland Kitchen omega-3 acid ethyl esters (LOVAZA) 1 G capsule Take 1 g by mouth daily.   No facility-administered encounter medications on file as of 12/12/2017.     Activities of Daily Living In your present state of health, do you have any difficulty performing the following activities: 12/12/2017  Hearing? N  Vision? Y  Comment macular degeneration  Difficulty concentrating or making decisions? Y  Walking or climbing stairs? Y  Dressing or bathing? N  Doing errands, shopping? Y  Preparing Food and eating ?  N  Using the Toilet? N  In the past six months, have you accidently leaked urine? N  Do you have problems with loss of bowel control? N  Managing your Medications? N  Managing your Finances? N  Housekeeping or managing your Housekeeping? N  Some recent data might be hidden    Patient Care Team: Gayland Curry, DO as PCP - General (Geriatric Medicine) Gerarda Fraction, MD as Referring Physician (Ophthalmology)    Assessment:   This is a routine wellness examination for Kazia.  Exercise Activities and Dietary recommendations Current Exercise Habits: Home exercise routine, Type of exercise: walking, Time (Minutes): 60, Frequency (Times/Week): 7, Weekly Exercise (Minutes/Week): 420, Intensity: Mild, Exercise limited by: None identified  Goals    None      Fall Risk Fall Risk  12/12/2017 10/25/2017 10/19/2016 04/20/2016  Falls in the past year? No No No No   Is the patient's home free of loose throw rugs in walkways, pet beds, electrical cords, etc?   yes      Grab bars in the bathroom? yes      Handrails on the stairs?   yes      Adequate lighting?   yes  Depression Screen PHQ 2/9 Scores 12/12/2017 10/25/2017 10/19/2016 04/20/2016  PHQ - 2 Score 0 0 0 0     Cognitive Function MMSE - Mini Mental State Exam 12/12/2017 10/19/2016  Not completed:  Unable to complete -  Orientation to time 5 5  Orientation to Place 5 5  Registration 3 3  Attention/ Calculation 5 5  Recall 3 3  Language- name 2 objects 2 2  Language- repeat 1 1  Language- follow 3 step command 3 3  Language- read & follow direction 1 1  Write a sentence 1 0  Copy design 0 0  Copy design-comments unable to complete -  Total score 29 28        Immunization History  Administered Date(s) Administered  . Influenza Inj Mdck Quad Pf 04/14/2016  . Influenza Split 04/08/2013  . Influenza,inj,Quad PF,6+ Mos 03/26/2015  . Influenza-Unspecified 04/08/2014, 04/14/2016, 04/17/2017  . Pneumococcal Conjugate-13 06/06/2014  . Zoster Recombinat (Shingrix) 07/21/2017, 10/03/2017    Qualifies for Shingles Vaccine? Up to date  Screening Tests Health Maintenance  Topic Date Due  . TETANUS/TDAP  12/16/1950  . PNA vac Low Risk Adult (2 of 2 - PPSV23) 06/07/2015  . DEXA SCAN  10/25/2023 (Originally 12/17/1998)  . INFLUENZA VACCINE  01/25/2018    Cancer Screenings: Lung: Low Dose CT Chest recommended if Age 22-80 years, 30 pack-year currently smoking OR have quit w/in 15years. Patient does not qualify. Breast:  Up to date on Mammogram? Yes   Up to date of Bone Density/Dexa? No, declined Colorectal: up to date  Additional Screenings:  Hepatitis C Screening: declined TDAP due-patient wants to wait PNA 23 possible due-patient will call previous pharmacy     Plan:    I have personally reviewed and addressed the Medicare Annual Wellness questionnaire and have noted the following in the patient's chart:  A. Medical and social history B. Use of alcohol, tobacco or illicit drugs  C. Current medications and supplements D. Functional ability and status E.  Nutritional status F.  Physical activity G. Advance directives H. List of other physicians I.  Hospitalizations, surgeries, and ER visits in previous 12 months J.  [place] to include hearing, vision,  cognitive, depression L. Referrals and appointments - none  In addition, I have reviewed  and discussed with patient certain preventive protocols, quality metrics, and best practice recommendations. A written personalized care plan for preventive services as well as general preventive health recommendations were provided to patient.  See attached scanned questionnaire for additional information.   Signed,   Tyson Dense, RN Nurse Health Advisor  Patient Concerns: None

## 2017-12-14 DIAGNOSIS — H353211 Exudative age-related macular degeneration, right eye, with active choroidal neovascularization: Secondary | ICD-10-CM | POA: Diagnosis not present

## 2018-02-08 DIAGNOSIS — H353211 Exudative age-related macular degeneration, right eye, with active choroidal neovascularization: Secondary | ICD-10-CM | POA: Diagnosis not present

## 2018-02-08 DIAGNOSIS — H353231 Exudative age-related macular degeneration, bilateral, with active choroidal neovascularization: Secondary | ICD-10-CM | POA: Diagnosis not present

## 2018-04-05 DIAGNOSIS — H35362 Drusen (degenerative) of macula, left eye: Secondary | ICD-10-CM | POA: Diagnosis not present

## 2018-04-05 DIAGNOSIS — H353231 Exudative age-related macular degeneration, bilateral, with active choroidal neovascularization: Secondary | ICD-10-CM | POA: Diagnosis not present

## 2018-04-05 DIAGNOSIS — H353211 Exudative age-related macular degeneration, right eye, with active choroidal neovascularization: Secondary | ICD-10-CM | POA: Diagnosis not present

## 2018-04-26 DIAGNOSIS — Z23 Encounter for immunization: Secondary | ICD-10-CM | POA: Diagnosis not present

## 2018-04-30 DIAGNOSIS — R739 Hyperglycemia, unspecified: Secondary | ICD-10-CM | POA: Diagnosis not present

## 2018-04-30 DIAGNOSIS — D649 Anemia, unspecified: Secondary | ICD-10-CM | POA: Diagnosis not present

## 2018-04-30 DIAGNOSIS — E785 Hyperlipidemia, unspecified: Secondary | ICD-10-CM | POA: Diagnosis not present

## 2018-04-30 DIAGNOSIS — E871 Hypo-osmolality and hyponatremia: Secondary | ICD-10-CM | POA: Diagnosis not present

## 2018-04-30 DIAGNOSIS — E119 Type 2 diabetes mellitus without complications: Secondary | ICD-10-CM | POA: Diagnosis not present

## 2018-04-30 DIAGNOSIS — M816 Localized osteoporosis [Lequesne]: Secondary | ICD-10-CM | POA: Diagnosis not present

## 2018-04-30 LAB — LIPID PANEL
Cholesterol: 198 (ref 0–200)
HDL: 84 — AB (ref 35–70)
LDL Cholesterol: 101
Triglycerides: 64 (ref 40–160)

## 2018-04-30 LAB — BASIC METABOLIC PANEL
BUN: 14 (ref 4–21)
Creatinine: 0.4 — AB (ref 0.5–1.1)
Glucose: 99
Potassium: 4.2 (ref 3.4–5.3)
Sodium: 139 (ref 137–147)

## 2018-04-30 LAB — CBC AND DIFFERENTIAL
HCT: 40 (ref 36–46)
Hemoglobin: 14.1 (ref 12.0–16.0)
Platelets: 204 (ref 150–399)
WBC: 4.8

## 2018-04-30 LAB — HEPATIC FUNCTION PANEL
ALT: 9 (ref 7–35)
AST: 17 (ref 13–35)
Alkaline Phosphatase: 86 (ref 25–125)
Bilirubin, Total: 0.5

## 2018-04-30 LAB — HEMOGLOBIN A1C: Hemoglobin A1C: 5.8

## 2018-05-01 ENCOUNTER — Encounter: Payer: Self-pay | Admitting: Internal Medicine

## 2018-05-02 ENCOUNTER — Encounter: Payer: Self-pay | Admitting: Internal Medicine

## 2018-05-02 ENCOUNTER — Non-Acute Institutional Stay: Payer: Medicare Other | Admitting: Internal Medicine

## 2018-05-02 VITALS — BP 110/68 | HR 71 | Temp 98.7°F | Ht 70.0 in | Wt 102.0 lb

## 2018-05-02 DIAGNOSIS — Z23 Encounter for immunization: Secondary | ICD-10-CM

## 2018-05-02 DIAGNOSIS — M810 Age-related osteoporosis without current pathological fracture: Secondary | ICD-10-CM

## 2018-05-02 DIAGNOSIS — R739 Hyperglycemia, unspecified: Secondary | ICD-10-CM | POA: Diagnosis not present

## 2018-05-02 DIAGNOSIS — H548 Legal blindness, as defined in USA: Secondary | ICD-10-CM

## 2018-05-02 NOTE — Progress Notes (Signed)
Location:  Occupational psychologist of Service:  Clinic (12)  Provider: Sima Lindenberger L. Mariea Clonts, D.O., C.M.D.  Code Status: DNR, MOST Goals of Care:  Advanced Directives 05/02/2018  Does Patient Have a Medical Advance Directive? Yes  Type of Paramedic of [place];Living will;Out of facility DNR (pink MOST or yellow form)  Does patient want to make changes to medical advance directive? No - Patient declined  Copy of [place] in Chart? Yes - validated most recent copy scanned in chart (See row information)  Pre-existing out of facility DNR order (yellow form or pink MOST form) Yellow form placed in chart (order not valid for inpatient use);Pink MOST form placed in chart (order not valid for inpatient use)   Chief Complaint  Patient presents with  . Medical Management of Chronic Issues    extended visit    HPI: Patient is a 84 y.o. female seen today for medical management of chronic diseases.    She is doing well.  Has no new concerns.  Reports minotiny.    She is finally speaking with a realtor about her home that she's looking to sell.    She is still walking, but not as much.  The hall has gotten very busy and she is constantly passing.  She gets frustrated that she doesn't know who she's speaking with.  She does got to the machines more now instead.   There's a storage room in [place].  She had 10 years of medical records in there.  Her caregiver went to help her get things.  There was a walker in there and it was otherwise empty.  All of her calendars were missing with the medical info on them.  She was stressed about it.  When she was in [place], she got a list of injections from the pharmacy there.  She just had her flu shot.  She didn't even feel that one.  Was upset that it was not the high dose.  Explained to her why not everyone gives high dose.  She plans to talk with [facility] administration.  She has not had pain.    She  continues on vitamin D3 for her osteoporosis.  Refuses other therapy.  Still exercises.  Reviewed labs with her.  Hba1c has trended down by 0.1.    Past Medical History:  Diagnosis Date  . Anxiety   . Arthritis   . Back pain   . Borderline diabetes   . Compression fracture of lumbar vertebra ([place]) 2013  . Constipation   . Displaced intertrochanteric fracture of left femur ([place])   . Fracture of unspecified part of neck of unspecified femur, initial encounter for closed fracture ([place])   . GERD (gastroesophageal reflux disease)   . Hyperglycemia   . Hypo-osmolality and hyponatremia   . Macular degeneration    both eyes, blind  . Osteoporosis     Past Surgical History:  Procedure Laterality Date  . CATARACT EXTRACTION    . HIP FRACTURE SURGERY Left 2016  . INTRAMEDULLARY (IM) NAIL INTERTROCHANTERIC Left 03/24/2015   Procedure: INTRAMEDULLARY (IM) NAIL INTERTROCHANTRIC;  Surgeon: Rod Can, MD;  Location: [place];  Service: Orthopedics;  Laterality: Left;  . TONSILLECTOMY      No Known Allergies  Outpatient Encounter Medications as of 05/02/2018  Medication Sig  . acetaminophen (TYLENOL) 500 MG tablet Take 1,000 mg by mouth every 6 (six) hours as needed. For pain.  . cholecalciferol (VITAMIN D) 1000 UNITS  tablet Take 2,000 Units by mouth daily.  . dorzolamide-timolol (COSOPT) 22.3-6.8 MG/ML ophthalmic solution Place 1 drop into the right eye 2 (two) times daily.  . Multiple Vitamin (MULITIVITAMIN WITH MINERALS) TABS Take 1 tablet by mouth daily.  Marland Kitchen omega-3 acid ethyl esters (LOVAZA) 1 G capsule Take 1 g by mouth daily.  . TRAVATAN Z 0.004 % SOLN ophthalmic solution Place 1 drop into the right eye at bedtime.    No facility-administered encounter medications on file as of 05/02/2018.     Review of Systems:  Review of Systems  Constitutional: Negative for chills, fever, malaise/fatigue and weight loss.  HENT: Negative for congestion and hearing loss.   Eyes: Positive for  blurred vision.       Legally blind  Respiratory: Negative for cough and shortness of breath.   Cardiovascular: Negative for chest pain, palpitations and leg swelling.  Gastrointestinal: Negative for abdominal pain, blood in stool, constipation, diarrhea and melena.  Genitourinary: Negative for dysuria.  Musculoskeletal: Negative for back pain, falls, joint pain, myalgias and neck pain.  Skin: Negative for itching and rash.  Neurological: Negative for dizziness and loss of consciousness.  Endo/Heme/Allergies: Does not bruise/bleed easily.  Psychiatric/Behavioral: Negative for depression and memory loss.       [place]    Health Maintenance  Topic Date Due  . TETANUS/TDAP  12/16/1950  . PNA vac Low Risk Adult (2 of 2 - PPSV23) 06/07/2015  . DEXA SCAN  10/25/2023 (Originally 12/17/1998)  . INFLUENZA VACCINE  Completed    Physical Exam: Vitals:   05/02/18 1019  BP: 110/68  Pulse: 71  Temp: 98.7 F (37.1 C)  TempSrc: Oral  SpO2: 97%  Weight: 102 lb (46.3 kg)  Height: 5\' 10"  (1.778 m)   Body mass index is 14.64 kg/m. Physical Exam  Constitutional: She is oriented to person, place, and time. She appears well-developed. No distress.  Cardiovascular: Normal rate, regular rhythm, normal heart sounds and intact distal pulses.  Pulmonary/Chest: Effort normal and breath sounds normal. No respiratory distress.  Abdominal: Bowel sounds are normal.  Musculoskeletal: Normal range of motion.  Neurological: She is alert and oriented to person, place, and time.  Skin: Skin is warm and dry. Capillary refill takes less than 2 seconds.  Psychiatric: She has a normal mood and affect.    Labs reviewed: Basic Metabolic Panel: Recent Labs    04/30/18 0700  NA 139  K 4.2  BUN 14  CREATININE 0.4*   Liver Function Tests: Recent Labs    04/30/18 0700  AST 17  ALT 9  ALKPHOS 86   No results for input(s): LIPASE, AMYLASE in the last 8760 hours. No results for input(s): AMMONIA in the  last 8760 hours. CBC: Recent Labs    04/30/18 0700  WBC 4.8  HGB 14.1  HCT 40  PLT 204   Lipid Panel: Recent Labs    04/30/18 0700  CHOL 198  HDL 84*  LDLCALC 101  TRIG 64   Lab Results  Component Value Date   HGBA1C 5.8 04/30/2018    Assessment/Plan 1. Senile osteoporosis -cont vitamin D and weightbearing exercise  2. Hyperglycemia -has trended up but still great  3. Legal blindness -cont [place] level of care  4. Need for 23-polyvalent pneumococcal polysaccharide vaccine -pneumovax given  Labs/tests ordered:  No new Next appt:  6 mos med mgt  Breezy Hertenstein L. Zane Pellecchia, D.O. [facility] [address],  [address] Cell Phone (  Mon-Fri 8am-5pm):  [phone] On Call:  [phone] & follow prompts after 5pm & weekends Office Phone:  [phone] Office Fax:  [phone]

## 2018-05-31 DIAGNOSIS — H353231 Exudative age-related macular degeneration, bilateral, with active choroidal neovascularization: Secondary | ICD-10-CM | POA: Diagnosis not present

## 2018-08-02 DIAGNOSIS — H353231 Exudative age-related macular degeneration, bilateral, with active choroidal neovascularization: Secondary | ICD-10-CM | POA: Diagnosis not present

## 2018-10-04 DIAGNOSIS — H353231 Exudative age-related macular degeneration, bilateral, with active choroidal neovascularization: Secondary | ICD-10-CM | POA: Diagnosis not present

## 2018-10-21 ENCOUNTER — Encounter: Payer: Self-pay | Admitting: Internal Medicine

## 2018-10-31 ENCOUNTER — Other Ambulatory Visit: Payer: Self-pay

## 2018-10-31 ENCOUNTER — Non-Acute Institutional Stay: Payer: Medicare Other | Admitting: Internal Medicine

## 2018-10-31 ENCOUNTER — Encounter: Payer: Self-pay | Admitting: Internal Medicine

## 2018-10-31 VITALS — BP 110/70 | HR 85 | Temp 98.6°F | Ht 70.0 in | Wt 100.0 lb

## 2018-10-31 DIAGNOSIS — S32000S Wedge compression fracture of unspecified lumbar vertebra, sequela: Secondary | ICD-10-CM

## 2018-10-31 DIAGNOSIS — R739 Hyperglycemia, unspecified: Secondary | ICD-10-CM

## 2018-10-31 DIAGNOSIS — H548 Legal blindness, as defined in USA: Secondary | ICD-10-CM

## 2018-10-31 DIAGNOSIS — M810 Age-related osteoporosis without current pathological fracture: Secondary | ICD-10-CM

## 2018-10-31 NOTE — Progress Notes (Signed)
Location:  [facility] clinic Provider:  Kyshaun Barnette L. Mariea Clonts, D.O., C.M.D.  Code Status: DNR, MOST Goals of Care:  Advanced Directives 10/31/2018  Does Patient Have a Medical Advance Directive? Yes  Type of Paramedic of [place];Living will;Out of facility DNR (pink MOST or yellow form)  Does patient want to make changes to medical advance directive? No - Patient declined  Copy of [place] in Chart? Yes - validated most recent copy scanned in chart (See row information)  Pre-existing out of facility DNR order (yellow form or pink MOST form) Yellow form placed in chart (order not valid for inpatient use);Pink MOST form placed in chart (order not valid for inpatient use)   Chief Complaint  Patient presents with  . Medical Management of Chronic Issues    59mth follow-up   HPI: Patient is a 85 y.o. female seen today for medical management of chronic diseases.    Her vision continues to decrease and she's not dealing with it as well as she used to,  Sees Dr. Cordelia Pen.  Gets eye injections there.  There's nothing in a day's routine that does not require eyes.  That does get to her.  Everything else going on in the world is sobering, also.  She struggles to apply make-up which doesn't start her off on the right foot.  If it reaches a point where she can do nothing for herself, she does not want to be here or anywhere.  It's been about 35 years since her vision started to go.    She has a piece of good news.  Her house sold in January quickly and well.  The man who bought it was thrilled with it.  He's wanted that home for years.  She donated her things to hospice.  She has only a few cousins left in her family.  She has a MOST form on file.    Reviewed labs from Nov which were all great.  She is on her vitamin D3 for her osteoporosis.    Past Medical History:  Diagnosis Date  . Anxiety   . Arthritis   . Back pain   . Borderline diabetes   . Compression  fracture of lumbar vertebra ([place]) 2013  . Constipation   . Displaced intertrochanteric fracture of left femur ([place])   . Fracture of unspecified part of neck of unspecified femur, initial encounter for closed fracture ([place])   . GERD (gastroesophageal reflux disease)   . Hyperglycemia   . Hypo-osmolality and hyponatremia   . Macular degeneration    both eyes, blind  . Osteoporosis     Past Surgical History:  Procedure Laterality Date  . CATARACT EXTRACTION    . HIP FRACTURE SURGERY Left 2016  . INTRAMEDULLARY (IM) NAIL INTERTROCHANTERIC Left 03/24/2015   Procedure: INTRAMEDULLARY (IM) NAIL INTERTROCHANTRIC;  Surgeon: Rod Can, MD;  Location: [place];  Service: Orthopedics;  Laterality: Left;  . TONSILLECTOMY      No Known Allergies  Outpatient Encounter Medications as of 10/31/2018  Medication Sig  . acetaminophen (TYLENOL) 500 MG tablet Take 1,000 mg by mouth every 6 (six) hours as needed. For pain.  . cholecalciferol (VITAMIN D) 1000 UNITS tablet Take 2,000 Units by mouth daily.  . dorzolamide-timolol (COSOPT) 22.3-6.8 MG/ML ophthalmic solution Place 1 drop into the right eye 2 (two) times daily.  . Multiple Vitamin (MULITIVITAMIN WITH MINERALS) TABS Take 1 tablet by mouth daily.  Marland Kitchen omega-3 acid ethyl esters (LOVAZA) 1 G capsule  Take 1 g by mouth daily.  . TRAVATAN Z 0.004 % SOLN ophthalmic solution Place 1 drop into the right eye at bedtime.    No facility-administered encounter medications on file as of 10/31/2018.     Review of Systems:  Review of Systems  Constitutional: Negative for chills, fever and malaise/fatigue.  HENT: Negative for congestion and hearing loss.   Eyes: Positive for blurred vision.       Legally blind--progressing recently  Respiratory: Negative for cough and shortness of breath.   Cardiovascular: Negative for chest pain and leg swelling.  Gastrointestinal: Negative for abdominal pain, blood in stool, constipation, diarrhea and melena.    Genitourinary: Negative for dysuria.  Musculoskeletal: Positive for back pain. Negative for falls and joint pain.       Rare if she overdoes it  Skin: Negative for itching and rash.  Neurological: Negative for dizziness and loss of consciousness.  Endo/Heme/Allergies: Does not bruise/bleed easily.  Psychiatric/Behavioral: Negative for depression and memory loss. The patient is not nervous/anxious and does not have insomnia.     Health Maintenance  Topic Date Due  . TETANUS/TDAP  12/16/1950  . DEXA SCAN  10/25/2023 (Originally 12/17/1998)  . INFLUENZA VACCINE  01/26/2019  . PNA vac Low Risk Adult  Completed    Physical Exam: Vitals:   10/31/18 1053  BP: 110/70  Pulse: 85  Temp: 98.6 F (37 C)  TempSrc: Oral  SpO2: 97%  Weight: 100 lb (45.4 kg)  Height: 5\' 10"  (1.778 m)   Body mass index is 14.35 kg/m. Physical Exam Vitals signs and nursing note reviewed.  Constitutional:      General: She is not in acute distress.    Appearance: Normal appearance. She is not toxic-appearing.     Comments: Thin female  HENT:     Right Ear: External ear normal.     Left Ear: External ear normal.  Eyes:     Comments: Low vision, struggling more to identify people even if they're wearing specific outfits now  Pulmonary:     Effort: Pulmonary effort is normal.  Musculoskeletal: Normal range of motion.     Comments: Ambulates with rollator walker  Skin:    General: Skin is warm and dry.  Neurological:     General: No focal deficit present.     Mental Status: She is alert and oriented to person, place, and time.  Psychiatric:        Mood and Affect: Mood normal.        Behavior: Behavior normal.        Thought Content: Thought content normal.        Judgment: Judgment normal.     Labs reviewed: Basic Metabolic Panel: Recent Labs    04/30/18 0700  NA 139  K 4.2  BUN 14  CREATININE 0.4*   Liver Function Tests: Recent Labs    04/30/18 0700  AST 17  ALT 9  ALKPHOS 86    No results for input(s): LIPASE, AMYLASE in the last 8760 hours. No results for input(s): AMMONIA in the last 8760 hours. CBC: Recent Labs    04/30/18 0700  WBC 4.8  HGB 14.1  HCT 40  PLT 204   Lipid Panel: Recent Labs    04/30/18 0700  CHOL 198  HDL 84*  LDLCALC 101  TRIG 64   Lab Results  Component Value Date   HGBA1C 5.8 04/30/2018    Procedures since last visit: No results found.  Assessment/Plan 1. Hyperglycemia -  is very good about following a diabetic diet; she avoids desserts and sticks with healthy fruits; she does not eat between meals -she's actually underweight, but hba1c great at 5.8 when last checked  2. Compression fracture of lumbar vertebra, unspecified lumbar vertebral level, sequela -little pain here at this point, may use tylenol if needed, heat, rest, topicals if problematic  3. Senile osteoporosis -cont vitamin D3 therapy; refuses other medications for this and continues to walk for exercise with her walker  4. Legal blindness -getting worse and she dreads increased dependence on others -cont [place] level of care  Labs/tests ordered:  Cbc, cmp, flp, hba1c before Next appt:  6 mos med mgt, fasting labs before  Krystal [name] L. Creta Dorame, D.O. [facility] [address], [place] [address] Cell Phone (Mon-Fri 8am-5pm):  [phone] On Call:  [phone] & follow prompts after 5pm & weekends Office Phone:  [phone] Office Fax:  [phone]

## 2018-11-29 DIAGNOSIS — H353231 Exudative age-related macular degeneration, bilateral, with active choroidal neovascularization: Secondary | ICD-10-CM | POA: Diagnosis not present

## 2018-12-05 DIAGNOSIS — Z20828 Contact with and (suspected) exposure to other viral communicable diseases: Secondary | ICD-10-CM | POA: Diagnosis not present

## 2019-01-21 ENCOUNTER — Non-Acute Institutional Stay (SKILLED_NURSING_FACILITY): Payer: Medicare Other | Admitting: Adult Health

## 2019-01-21 ENCOUNTER — Encounter: Payer: Self-pay | Admitting: Adult Health

## 2019-01-21 DIAGNOSIS — Z0000 Encounter for general adult medical examination without abnormal findings: Secondary | ICD-10-CM | POA: Diagnosis not present

## 2019-01-21 NOTE — Patient Instructions (Signed)
Krystal [name] , Thank you for taking time to come for your Medicare Wellness Visit. I appreciate your ongoing commitment to your health goals. Please review the following plan we discussed and let me know if I can assist you in the future.   Screening recommendations/referrals: Colonoscopy aged out Mammogram aged out Bone Density refused Recommended yearly ophthalmology/optometry visit for glaucoma screening and checkup Recommended yearly dental visit for hygiene and checkup  Vaccinations: Influenza vaccine up to date Pneumococcal vaccine up to date Tdap vaccine refused Shingles vaccine up to date    Advanced directives:  Reviewed   Conditions/risks identified: fall risk   Next appointment: 1 year   Preventive Care 32 Years and Older, Female Preventive care refers to lifestyle choices and visits with your health care provider that can promote health and wellness. What does preventive care include?  A yearly physical exam. This is also called an annual well check.  Dental exams once or twice a year.  Routine eye exams. Ask your health care provider how often you should have your eyes checked.  Personal lifestyle choices, including:  Daily care of your teeth and gums.  Regular physical activity.  Eating a healthy diet.  Avoiding tobacco and drug use.  Limiting alcohol use.  Practicing safe sex.  Taking low-dose aspirin every day.  Taking vitamin and mineral supplements as recommended by your health care provider. What happens during an annual well check? The services and screenings done by your health care provider during your annual well check will depend on your age, overall health, lifestyle risk factors, and family history of disease. Counseling  Your health care provider may ask you questions about your:  Alcohol use.  Tobacco use.  Drug use.  Emotional well-being.  Home and relationship well-being.  Sexual activity.  Eating habits.  History of  falls.  Memory and ability to understand (cognition).  Work and work Statistician.  Reproductive health. Screening  You may have the following tests or measurements:  Height, weight, and BMI.  Blood pressure.  Lipid and cholesterol levels. These may be checked every 5 years, or more frequently if you are over 49 years old.  Skin check.  Lung cancer screening. You may have this screening every year starting at age 31 if you have a 30-pack-year history of smoking and currently smoke or have quit within the past 15 years.  Fecal occult blood test (FOBT) of the stool. You may have this test every year starting at age 49.  Flexible sigmoidoscopy or colonoscopy. You may have a sigmoidoscopy every 5 years or a colonoscopy every 10 years starting at age 7.  Hepatitis C blood test.  Hepatitis B blood test.  Sexually transmitted disease (STD) testing.  Diabetes screening. This is done by checking your blood sugar (glucose) after you have not eaten for a while (fasting). You may have this done every 1-3 years.  Bone density scan. This is done to screen for osteoporosis. You may have this done starting at age 64.  Mammogram. This may be done every 1-2 years. Talk to your health care provider about how often you should have regular mammograms. Talk with your health care provider about your test results, treatment options, and if necessary, the need for more tests. Vaccines  Your health care provider may recommend certain vaccines, such as:  Influenza vaccine. This is recommended every year.  Tetanus, diphtheria, and acellular pertussis (Tdap, Td) vaccine. You may need a Td booster every 10 years.  Zoster vaccine. You  may need this after age 12.  Pneumococcal 13-valent conjugate (PCV13) vaccine. One dose is recommended after age 51.  Pneumococcal polysaccharide (PPSV23) vaccine. One dose is recommended after age 39. Talk to your health care provider about which screenings and  vaccines you need and how often you need them. This information is not intended to replace advice given to you by your health care provider. Make sure you discuss any questions you have with your health care provider. Document Released: 07/10/2015 Document Revised: 03/02/2016 Document Reviewed: 04/14/2015 Elsevier Interactive Patient Education  2017 [place] Prevention in the Home Falls can cause injuries. They can happen to people of all ages. There are many things you can do to make your home safe and to help prevent falls. What can I do on the outside of my home?  Regularly fix the edges of walkways and driveways and fix any cracks.  Remove anything that might make you trip as you walk through a door, such as a raised step or threshold.  Trim any bushes or trees on the path to your home.  Use bright outdoor lighting.  Clear any walking paths of anything that might make someone trip, such as rocks or tools.  Regularly check to see if handrails are loose or broken. Make sure that both sides of any steps have handrails.  Any raised decks and porches should have guardrails on the edges.  Have any leaves, snow, or ice cleared regularly.  Use sand or salt on walking paths during winter.  Clean up any spills in your garage right away. This includes oil or grease spills. What can I do in the bathroom?  Use night lights.  Install grab bars by the toilet and in the tub and shower. Do not use towel bars as grab bars.  Use non-skid mats or decals in the tub or shower.  If you need to sit down in the shower, use a plastic, non-slip stool.  Keep the floor dry. Clean up any water that spills on the floor as soon as it happens.  Remove soap buildup in the tub or shower regularly.  Attach bath mats securely with double-sided non-slip rug tape.  Do not have throw rugs and other things on the floor that can make you trip. What can I do in the bedroom?  Use night lights.   Make sure that you have a light by your bed that is easy to reach.  Do not use any sheets or blankets that are too big for your bed. They should not hang down onto the floor.  Have a firm chair that has side arms. You can use this for support while you get dressed.  Do not have throw rugs and other things on the floor that can make you trip. What can I do in the kitchen?  Clean up any spills right away.  Avoid walking on wet floors.  Keep items that you use a lot in easy-to-reach places.  If you need to reach something above you, use a strong step stool that has a grab bar.  Keep electrical cords out of the way.  Do not use floor polish or wax that makes floors slippery. If you must use wax, use non-skid floor wax.  Do not have throw rugs and other things on the floor that can make you trip. What can I do with my stairs?  Do not leave any items on the stairs.  Make sure that there are handrails on both  sides of the stairs and use them. Fix handrails that are broken or loose. Make sure that handrails are as long as the stairways.  Check any carpeting to make sure that it is firmly attached to the stairs. Fix any carpet that is loose or worn.  Avoid having throw rugs at the top or bottom of the stairs. If you do have throw rugs, attach them to the floor with carpet tape.  Make sure that you have a light switch at the top of the stairs and the bottom of the stairs. If you do not have them, ask someone to add them for you. What else can I do to help prevent falls?  Wear shoes that:  Do not have high heels.  Have rubber bottoms.  Are comfortable and fit you well.  Are closed at the toe. Do not wear sandals.  If you use a stepladder:  Make sure that it is fully opened. Do not climb a closed stepladder.  Make sure that both sides of the stepladder are locked into place.  Ask someone to hold it for you, if possible.  Clearly mark and make sure that you can see:  Any  grab bars or handrails.  First and last steps.  Where the edge of each step is.  Use tools that help you move around (mobility aids) if they are needed. These include:  Canes.  Walkers.  Scooters.  Crutches.  Turn on the lights when you go into a dark area. Replace any light bulbs as soon as they burn out.  Set up your furniture so you have a clear path. Avoid moving your furniture around.  If any of your floors are uneven, fix them.  If there are any pets around you, be aware of where they are.  Review your medicines with your doctor. Some medicines can make you feel dizzy. This can increase your chance of falling. Ask your doctor what other things that you can do to help prevent falls. This information is not intended to replace advice given to you by your health care provider. Make sure you discuss any questions you have with your health care provider. Document Released: 04/09/2009 Document Revised: 11/19/2015 Document Reviewed: 07/18/2014 Elsevier Interactive Patient Education  2017 [employer].

## 2019-01-21 NOTE — Progress Notes (Addendum)
Subjective:   Krystal [name] is a 85 y.o. female who presents for Medicare Annual (Subsequent) preventive examination.  Review of Systems:   Cardiac Risk Factors include: advanced age (>48men, >37 women);smoking/ tobacco exposure     Objective:     Vitals: Wt 101 lb 12.8 oz (46.2 kg)   BMI 14.61 kg/m   Body mass index is 14.61 kg/m.  Advanced Directives 10/31/2018 05/02/2018 12/12/2017 10/25/2017 04/26/2017 10/19/2016 04/20/2016  Does Patient Have a Medical Advance Directive? Yes Yes Yes Yes Yes Yes Yes  Type of Paramedic of [place];Living will;Out of facility DNR (pink MOST or yellow form) [place];Living will;Out of facility DNR (pink MOST or yellow form) [place];Out of facility DNR (pink MOST or yellow form) [place];Out of facility DNR (pink MOST or yellow form) Out of facility DNR (pink MOST or yellow form);Healthcare Power of [employer] of facility DNR (pink MOST or yellow form);Healthcare Power of [employer] of facility DNR (pink MOST or yellow form);Healthcare Power of Attorney  Does patient want to make changes to medical advance directive? No - Patient declined No - Patient declined No - Patient declined No - Patient declined - - -  Copy of [place] in Chart? Yes - validated most recent copy scanned in chart (See row information) Yes - validated most recent copy scanned in chart (See row information) Yes Yes Yes Yes Yes  Pre-existing out of facility DNR order (yellow form or pink MOST form) Yellow form placed in chart (order not valid for inpatient use);Pink MOST form placed in chart (order not valid for inpatient use) Yellow form placed in chart (order not valid for inpatient use);Pink MOST form placed in chart (order not valid for inpatient use) Yellow form placed in chart (order not valid for inpatient use) Yellow form placed in chart (order not valid for inpatient use);Pink  MOST form placed in chart (order not valid for inpatient use) Yellow form placed in chart (order not valid for inpatient use);Pink MOST form placed in chart (order not valid for inpatient use) Yellow form placed in chart (order not valid for inpatient use);Pink MOST form placed in chart (order not valid for inpatient use) Yellow form placed in chart (order not valid for inpatient use);Pink MOST form placed in chart (order not valid for inpatient use)    Tobacco Social History   Tobacco Use  Smoking Status Former Smoker  . Quit date: 06/27/1965  . Years since quitting: 53.6  Smokeless Tobacco Never Used     Counseling given: Not Answered   Clinical Intake:                       Past Medical History:  Diagnosis Date  . Anxiety   . Arthritis   . Back pain   . Borderline diabetes   . Compression fracture of lumbar vertebra ([place]) 2013  . Constipation   . Displaced intertrochanteric fracture of left femur ([place])   . Fracture of unspecified part of neck of unspecified femur, initial encounter for closed fracture ([place])   . GERD (gastroesophageal reflux disease)   . Hyperglycemia   . Hypo-osmolality and hyponatremia   . Macular degeneration    both eyes, blind  . Osteoporosis    Past Surgical History:  Procedure Laterality Date  . CATARACT EXTRACTION    . HIP FRACTURE SURGERY Left 2016  . INTRAMEDULLARY (IM) NAIL INTERTROCHANTERIC Left 03/24/2015  Procedure: INTRAMEDULLARY (IM) NAIL INTERTROCHANTRIC;  Surgeon: Rod Can, MD;  Location: [facility];  Service: Orthopedics;  Laterality: Left;  . TONSILLECTOMY     Family History  Problem Relation Age of Onset  . Cancer Mother        breast  . Heart disease Father    Social History   Socioeconomic History  . Marital status: Single    Spouse name: Not on file  . Number of children: Not on file  . Years of education: Not on file  . Highest education level: Not on file  Occupational History  . Occupation: retired   Scientific laboratory technician  . Financial resource strain: Not hard at all  . Food insecurity    Worry: Never true    Inability: Never true  . Transportation needs    Medical: No    Non-medical: No  Tobacco Use  . Smoking status: Former Smoker    Quit date: 06/27/1965    Years since quitting: 53.6  . Smokeless tobacco: Never Used  Substance and Sexual Activity  . Alcohol use: Never    Frequency: Never    Comment: wine occasional  . Drug use: No  . Sexual activity: Never  Lifestyle  . Physical activity    Days per week: 7 days    Minutes per session: 60 min  . Stress: To some extent  Relationships  . Social connections    Talks on phone: More than three times a week    Gets together: More than three times a week    Attends religious service: Never    Active member of club or organization: No    Attends meetings of clubs or organizations: Never    Relationship status: Never married  Other Topics Concern  . 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 acetaminophen (TYLENOL) 500 MG tablet Take 1,000 mg by mouth every 6 (six) hours as needed. For pain.  . cholecalciferol (VITAMIN D) 1000 UNITS tablet Take 2,000 Units by mouth daily.  . dorzolamide-timolol (COSOPT) 22.3-6.8 MG/ML ophthalmic solution Place 1 drop into the right eye 2 (two) times daily.  . Multiple Vitamin (MULITIVITAMIN WITH MINERALS) TABS Take 1 tablet by mouth daily.  Marland Kitchen omega-3 acid ethyl esters (LOVAZA) 1 G capsule Take 1 g by mouth daily.  . TRAVATAN Z 0.004 % SOLN ophthalmic solution Place 1 drop into the right eye at bedtime.     No facility-administered encounter medications on file as of 01/21/2019.     Activities of Daily Living In your present state of health, do you have any difficulty performing the following activities: 01/21/2019  Hearing? Y  Vision? Y  Difficulty concentrating or making decisions? N  Walking or climbing stairs? Y  Dressing or bathing? N  Doing errands, shopping? Y  Preparing Food and eating ? Y  Using the Toilet? Y  In the past six months, have you accidently leaked urine? N  Do you have problems with loss of bowel control? N  Managing your Medications? Y  Managing your Finances? Darreld Mclean  Housekeeping or managing your Housekeeping? Y  Some recent data might be hidden    Patient Care Team: Gayland Curry, DO as PCP - General (Geriatric Medicine) Gerarda Fraction, MD as Referring Physician (Ophthalmology)    Assessment:   This is a routine wellness examination for Krystal [name].  Exercise Activities and Dietary recommendations Current Exercise Habits: Home exercise routine, Type of exercise: walking, Time (Minutes): 30, Frequency (Times/Week): >7, Weekly Exercise (Minutes/Week): 0, Intensity: Moderate  Goals    . DIET - EAT MORE FRUITS AND VEGETABLES       Fall Risk Fall Risk  01/21/2019 10/31/2018 05/02/2018 12/12/2017 10/25/2017  Falls in the past year? 0 0 0 No No  Number falls in past yr: 0 0 0 - -  Injury with Fall? 0 0 0 - -  Risk for fall due to : Impaired balance/gait;Impaired vision - - - -  Follow up Falls evaluation completed;Falls prevention discussed - - - -   Is the patient's home free of loose throw rugs in walkways, pet beds, electrical cords, etc?   yes      Grab bars in the bathroom? yes      Handrails on the stairs?   yes      Adequate lighting?   yes  Timed Get Up and Go performed: NA  Depression Screen PHQ 2/9 Scores 01/21/2019 10/31/2018 05/02/2018 12/12/2017  PHQ - 2 Score 1 0 0 0     Cognitive Function MMSE - Mini Mental State Exam 01/21/2019 12/12/2017 10/19/2016   Not completed: - Unable to complete -  Orientation to time 5 5 5   Orientation to Place 5 5 5   Registration 3 3 3   Attention/ Calculation 5 5 5   Recall 3 3 3   Language- name 2 objects 2 2 2   Language- repeat 1 1 1   Language- follow 3 step command 3 3 3   Language- read & follow direction 1 1 1   Write a sentence 1 1 0  Copy design 0 0 0  Copy design-comments - unable to complete -  Total score 29 29 28         Immunization History  Administered Date(s) Administered  . Influenza Inj Mdck Quad Pf 04/14/2016  . Influenza Split 04/08/2013  . Influenza,inj,Quad PF,6+ Mos 03/26/2015, 04/17/2018  . Influenza-Unspecified 04/08/2014, 04/14/2016, 04/17/2017  . Pneumococcal Conjugate-13 06/06/2014  . Pneumococcal Polysaccharide-23 05/02/2018  . Zoster 07/09/2012  . Zoster Recombinat (Shingrix) 07/21/2017, 10/03/2017    Qualifies for Shingles Vaccine? Screening Tests Health Maintenance  Topic Date Due  . TETANUS/TDAP  12/16/1950  . DEXA SCAN  10/25/2023 (Originally 12/17/1998)  . INFLUENZA VACCINE  01/26/2019  . PNA vac Low Risk Adult  Completed    Cancer Screenings: Lung: Low Dose CT Chest recommended if Age 49-80 years, 30 pack-year currently smoking OR have quit w/in 15years. Patient does not qualify. Breast:  Up to date on Mammogram? No   Up to date of Bone Density/Dexa? No Colorectal: aged out  Additional Screenings: not indicated Hepatitis C Screening:      Plan:      I have personally reviewed and noted the following in the patient's chart:   . Medical and social history . Use of alcohol, tobacco or illicit drugs  . Current medications and supplements . Functional ability and status . Nutritional status . Physical activity . Advanced directives . List of other physicians . Hospitalizations, surgeries, and ER visits in previous 12 months . Vitals . Screenings to include cognitive, depression, and falls .  Referrals and appointments  In addition, I have  reviewed and discussed with patient certain preventive protocols, quality metrics, and best practice recommendations. A written personalized care plan for preventive services as well as general preventive health recommendations were provided to patient.     Royal Hawthorn, NP  01/24/2019

## 2019-01-24 DIAGNOSIS — H353231 Exudative age-related macular degeneration, bilateral, with active choroidal neovascularization: Secondary | ICD-10-CM | POA: Diagnosis not present

## 2019-02-06 ENCOUNTER — Non-Acute Institutional Stay: Payer: Medicare Other | Admitting: Internal Medicine

## 2019-02-06 ENCOUNTER — Encounter: Payer: Self-pay | Admitting: Internal Medicine

## 2019-02-06 ENCOUNTER — Other Ambulatory Visit: Payer: Self-pay

## 2019-02-06 VITALS — BP 150/80 | HR 75 | Temp 97.7°F | Wt 100.0 lb

## 2019-02-06 DIAGNOSIS — R590 Localized enlarged lymph nodes: Secondary | ICD-10-CM | POA: Diagnosis not present

## 2019-02-06 DIAGNOSIS — N611 Abscess of the breast and nipple: Secondary | ICD-10-CM

## 2019-02-06 NOTE — Progress Notes (Signed)
Location:   [facility] of Service:  Clinic (12)  Provider: Marquies Wanat L. Mariea Clonts, D.O., C.M.D.  Code Status: DNR, MOST Goals of Care:  Advanced Directives 10/31/2018  Does Patient Have a Medical Advance Directive? Yes  Type of Paramedic of [place];Living will;Out of facility DNR (pink MOST or yellow form)  Does patient want to make changes to medical advance directive? No - Patient declined  Copy of [place] in Chart? Yes - validated most recent copy scanned in chart (See row information)  Pre-existing out of facility DNR order (yellow form or pink MOST form) Yellow form placed in chart (order not valid for inpatient use);Pink MOST form placed in chart (order not valid for inpatient use)     Chief Complaint  Patient presents with  . Acute Visit    left breast bleeding    HPI: Patient is a 85 y.o. [name] with blindness, DMII, osteoporosis  seen today for an acute visit for bleeding of her left breast.   She has had a place she's felt draining and scaling over on her left lateral breast x2-3 weeks.  Last night, it bled so much it soaked her nightgown and sheets.  It is now very tender.  When she first noticed the area, she thought it was cancer and didn't want to end up sent to the hospital where she won't know anyone and would be uncomfortable being blind.  She requested to speak with me and was given the appt today.    Past Medical History:  Diagnosis Date  . Anxiety   . Arthritis   . Back pain   . Borderline diabetes   . Compression fracture of lumbar vertebra ([place]) 2013  . Constipation   . Displaced intertrochanteric fracture of left femur ([place])   . Fracture of unspecified part of neck of unspecified femur, initial encounter for closed fracture ([place])   . GERD (gastroesophageal reflux disease)   . Hyperglycemia   . Hypo-osmolality and hyponatremia   . Macular degeneration    both eyes, blind  . Osteoporosis     Past  Surgical History:  Procedure Laterality Date  . CATARACT EXTRACTION    . HIP FRACTURE SURGERY Left 2016  . INTRAMEDULLARY (IM) NAIL INTERTROCHANTERIC Left 03/24/2015   Procedure: INTRAMEDULLARY (IM) NAIL INTERTROCHANTRIC;  Surgeon: Rod Can, MD;  Location: [facility];  Service: Orthopedics;  Laterality: Left;  . TONSILLECTOMY      No Known Allergies  Outpatient Encounter Medications as of 02/06/2019  Medication Sig  . acetaminophen (TYLENOL) 500 MG tablet Take 1,000 mg by mouth every 6 (six) hours as needed. For pain.  . cholecalciferol (VITAMIN D) 1000 UNITS tablet Take 2,000 Units by mouth daily.  . dorzolamide-timolol (COSOPT) 22.3-6.8 MG/ML ophthalmic solution Place 1 drop into the right eye 2 (two) times daily.  . Multiple Vitamin (MULITIVITAMIN WITH MINERALS) TABS Take 1 tablet by mouth daily.  Marland Kitchen omega-3 acid ethyl esters (LOVAZA) 1 G capsule Take 1 g by mouth daily.  . TRAVATAN Z 0.004 % SOLN ophthalmic solution Place 1 drop into the right eye at bedtime.    No facility-administered encounter medications on file as of 02/06/2019.     Review of Systems:  Review of Systems  Constitutional: Negative for chills and fever.       No fevers with daily covid screening   Eyes:       Blind--can basically see shadows only  Respiratory: Negative for cough and shortness  of breath.   Cardiovascular: Negative for chest pain and leg swelling.  Gastrointestinal: Negative for abdominal pain, blood in stool, constipation, diarrhea and melena.  Genitourinary: Negative for dysuria.  Musculoskeletal: Negative for falls and joint pain.  Skin:       Golf ball sized red tender draining mass on left breast  Neurological: Negative for dizziness and loss of consciousness.  Endo/Heme/Allergies: Does not bruise/bleed easily.  Psychiatric/Behavioral: Negative for depression and memory loss. The patient is not nervous/anxious and does not have insomnia.     Health Maintenance  Topic Date Due  .  TETANUS/TDAP  12/16/1950  . INFLUENZA VACCINE  01/26/2019  . DEXA SCAN  10/25/2023 (Originally 12/17/1998)  . PNA vac Low Risk Adult  Completed    Physical Exam: Vitals:   02/06/19 1401  BP: (!) 150/80  Pulse: 75  Temp: 97.7 F (36.5 C)  TempSrc: Oral  SpO2: 98%  Weight: 100 lb (45.4 kg)   Body mass index is 14.35 kg/m. Physical Exam Constitutional:      Appearance: She is not ill-appearing or toxic-appearing.     Comments: Thin [name] (has lost a few more lbs but relates to dietary changes with covid isolation precautions in [place])  HENT:     Head: Normocephalic and atraumatic.  Cardiovascular:     Rate and Rhythm: Normal rate and regular rhythm.  Pulmonary:     Effort: Pulmonary effort is normal.     Breath sounds: Normal breath sounds.  Musculoskeletal:     Comments: Walks with walker  Skin:    Comments: Left outer lower quadrant of breast with golf ball sized erythematous, tender mass with cystic type appearance bulging laterally;  Firm immobile axillary node also present  Neurological:     General: No focal deficit present.     Mental Status: She is alert and oriented to person, place, and time.     Labs reviewed: Basic Metabolic Panel: Recent Labs    04/30/18 0700  NA 139  K 4.2  BUN 14  CREATININE 0.4*   Liver Function Tests: Recent Labs    04/30/18 0700  AST 17  ALT 9  ALKPHOS 86   No results for input(s): LIPASE, AMYLASE in the last 8760 hours. No results for input(s): AMMONIA in the last 8760 hours. CBC: Recent Labs    04/30/18 0700  WBC 4.8  HGB 14.1  HCT Krystal  PLT 204   Lipid Panel: Recent Labs    04/30/18 0700  CHOL 198  HDL 84*  LDLCALC 101  TRIG 64   Lab Results  Component Value Date   HGBA1C 5.8 04/30/2018    Assessment/Plan 1. Left breast abscess -new onset over last 2-3 wks  -appears to be abscess rather than mass--does have hard lymph node in left axilla also -note that pt is quite private and also is very clear about  her goals of care--comfort based and does not want screenings and life-prolonging interventions in the context of a malignancy if this is underlying -we arranged a surgery appt for drainage of the abscess tomorrow morning at [facility]" to accompany resident and she will [facility] to the appt -I did not begin antibiotics so drainage could be cultured for best therapy and she has no fevers, chills or systemic symptoms  2. Axillary lymphadenopathy -noted, may be reactive from abscess  Labs/tests ordered: [facility] referral Next appt:  05/01/2019  Natasja Niday L. Darla Mcdonald, D.O. [facility] [address], [place] [address] Cell Phone (Mon-Fri 8am-5pm):  [phone] On Call:  [phone] & follow prompts after 5pm & weekends Office Phone:  [phone] Office Fax:  [phone]

## 2019-02-07 ENCOUNTER — Other Ambulatory Visit: Payer: Self-pay | Admitting: General Surgery

## 2019-02-07 ENCOUNTER — Ambulatory Visit
Admission: RE | Admit: 2019-02-07 | Discharge: 2019-02-07 | Disposition: A | Discharge status: Home / Self Care | Payer: Medicare Other | Source: Ambulatory Visit

## 2019-02-07 ENCOUNTER — Other Ambulatory Visit: Payer: Self-pay

## 2019-02-07 ENCOUNTER — Other Ambulatory Visit: Payer: Self-pay | Admitting: Student

## 2019-02-07 DIAGNOSIS — N63 Unspecified lump in unspecified breast: Secondary | ICD-10-CM

## 2019-02-07 DIAGNOSIS — N6320 Unspecified lump in the left breast, unspecified quadrant: Secondary | ICD-10-CM | POA: Diagnosis not present

## 2019-02-07 DIAGNOSIS — R922 Inconclusive mammogram: Secondary | ICD-10-CM | POA: Diagnosis not present

## 2019-02-11 ENCOUNTER — Ambulatory Visit
Admission: RE | Admit: 2019-02-11 | Discharge: 2019-02-11 | Disposition: A | Discharge status: Home / Self Care | Payer: Medicare Other | Source: Ambulatory Visit | Attending: Student | Admitting: Student

## 2019-02-11 ENCOUNTER — Other Ambulatory Visit: Payer: Self-pay

## 2019-02-11 ENCOUNTER — Other Ambulatory Visit: Payer: Self-pay | Admitting: Student

## 2019-02-11 DIAGNOSIS — N6323 Unspecified lump in the left breast, lower outer quadrant: Secondary | ICD-10-CM | POA: Diagnosis not present

## 2019-02-11 DIAGNOSIS — N63 Unspecified lump in unspecified breast: Secondary | ICD-10-CM

## 2019-02-11 DIAGNOSIS — C50512 Malignant neoplasm of lower-outer quadrant of left female breast: Secondary | ICD-10-CM | POA: Diagnosis not present

## 2019-02-11 DIAGNOSIS — C773 Secondary and unspecified malignant neoplasm of axilla and upper limb lymph nodes: Secondary | ICD-10-CM | POA: Diagnosis not present

## 2019-02-11 DIAGNOSIS — R590 Localized enlarged lymph nodes: Secondary | ICD-10-CM | POA: Diagnosis not present

## 2019-02-13 ENCOUNTER — Encounter: Payer: Self-pay | Admitting: Internal Medicine

## 2019-02-19 ENCOUNTER — Encounter: Payer: Self-pay | Admitting: Internal Medicine

## 2019-02-19 ENCOUNTER — Non-Acute Institutional Stay: Payer: Medicare Other | Admitting: Internal Medicine

## 2019-02-19 DIAGNOSIS — H548 Legal blindness, as defined in USA: Secondary | ICD-10-CM

## 2019-02-19 DIAGNOSIS — C799 Secondary malignant neoplasm of unspecified site: Secondary | ICD-10-CM

## 2019-02-19 DIAGNOSIS — Z7189 Other specified counseling: Secondary | ICD-10-CM | POA: Diagnosis not present

## 2019-02-19 DIAGNOSIS — C50912 Malignant neoplasm of unspecified site of left female breast: Secondary | ICD-10-CM

## 2019-02-19 NOTE — Progress Notes (Signed)
Patient ID: Krystal [name], female   DOB: Dec 04, 1931, 85 y.o.   MRN: [id number]  Location:  [facility] Room Number: 354 Place of Service:  ALF [phone]) Provider:   Gayland Curry, DO  Patient Care Team: Krystal Curry, DO as PCP - General (Geriatric Medicine) Krystal Fraction, MD as Referring Physician (Ophthalmology)  Extended Emergency Contact Information Primary Emergency Contact: Krystal [name], [place] [address] Krystal [name] of [place] Phone: [phone] Relation: Neighbor  Code Status:  DNR, MOST Goals of care: Advanced Directive information Advanced Directives 10/31/2018  Does Patient Have a Medical Advance Directive? Yes  Type of Paramedic of [place];Living will;Out of facility DNR (pink MOST or yellow form)  Does patient want to make changes to medical advance directive? No - Patient declined  Copy of [place] in Chart? Yes - validated most recent copy scanned in chart (See row information)  Pre-existing out of facility DNR order (yellow form or pink MOST form) Yellow form placed in chart (order not valid for inpatient use);Pink MOST form placed in chart (order not valid for inpatient use)     Chief Complaint  Patient presents with  . advanced care planning    care planning  . Acute Visit    discuss plan of care for breast cancer    HPI:  Pt is a 85 y.o. female seen today for an acute visit to discuss her breast cancer diagnosis and address her goals of care.  Krystal [name] has been clear with me from when we first met that she does not want interventions to prolong her life.  She is nearly blind--sees shapes, colors, shadows, but does manage to function fairly independently at an assisted living level of care (med administration and some minor assistance with small things in her apt).  I'm under the impression that her breast mass was present for a few months before she opted to report it  rather than weeks as she indicates.  She is highly intelligent and has thought things through quite thoroughly about her newly found breast cancer.  When I had seen her, it was uncertain whether she had a large abscess vs a tumor of her left breast, but she did have a worrisome matted axillary node that I detected and had lost a couple of more lbs unintentionally.  She had no fever or chills.  When I referred her to surgery, they were more certain of her diagnosis--she got a mammogram, ultrasound and core biopsy and axillary node biopsy very quickly all of which confirmed invasive ductal carcinoma with lymphovascular invasion.  Her-2 positive, ER/PR negative.  She is very bothered by the drainage from the mass and does not like the idea of dealing with dressing changes for the rest of her life.  She is meeting with Dr. Dalbert [name] tomorrow to discuss surgery.  She is very clear she does not want radiation or chemotherapy to prolong her life.  She simply wants to be kept comfortable.  However, an appointment has apparently been arranged with hematology to discuss potential chemotherapeutic interventions.  Krystal [name] does not want anything that will make her sicker.  She suffers enough with her visual loss.  She's like to be able to stay in her AL apt where she is familiar for as long as she's able to get around and care for herself.    We did discuss surgery and though she remembers seeing her  mother's mastectomy scars during childhood, she thinks she would want surgery for comfort purposes.  She is also in good enough health to tolerate this from my perspective.  She is quite thin and very particular about her diet so anything that will negatively affect her appetite will not be in her best interests.    Past Medical History:  Diagnosis Date  . Anxiety   . Arthritis   . Back pain   . Borderline diabetes   . Compression fracture of lumbar vertebra ([place]) 2013  . Constipation   . Displaced intertrochanteric fracture  of left femur ([place])   . Fracture of unspecified part of neck of unspecified femur, initial encounter for closed fracture ([place])   . GERD (gastroesophageal reflux disease)   . Hyperglycemia   . Hypo-osmolality and hyponatremia   . Macular degeneration    both eyes, blind  . Osteoporosis    Past Surgical History:  Procedure Laterality Date  . CATARACT EXTRACTION    . HIP FRACTURE SURGERY Left 2016  . INTRAMEDULLARY (IM) NAIL INTERTROCHANTERIC Left 03/24/2015   Procedure: INTRAMEDULLARY (IM) NAIL INTERTROCHANTRIC;  Surgeon: Rod Can, MD;  Location: [facility];  Service: Orthopedics;  Laterality: Left;  . TONSILLECTOMY      No Known Allergies  Outpatient Encounter Medications as of 02/19/2019  Medication Sig  . acetaminophen (TYLENOL) 500 MG tablet Take 1,000 mg by mouth every 6 (six) hours as needed. For pain.  . cholecalciferol (VITAMIN D) 1000 UNITS tablet Take 2,000 Units by mouth daily.  . dorzolamide-timolol (COSOPT) 22.3-6.8 MG/ML ophthalmic solution Place 1 drop into the right eye 2 (two) times daily.  . Multiple Vitamin (MULITIVITAMIN WITH MINERALS) TABS Take 1 tablet by mouth daily.  Marland Kitchen omega-3 acid ethyl esters (LOVAZA) 1 G capsule Take 1 g by mouth daily.  . TRAVATAN Z 0.004 % SOLN ophthalmic solution Place 1 drop into the right eye at bedtime.    No facility-administered encounter medications on file as of 02/19/2019.     Review of Systems  Constitutional: Negative for activity change, appetite change, chills, fatigue and fever.       Down only 3 lbs in the past year  HENT: Negative for congestion, hearing loss and trouble swallowing.   Eyes: Positive for visual disturbance (almost blind).  Respiratory: Negative for chest tightness and shortness of breath.   Cardiovascular: Negative for chest pain, palpitations and leg swelling.  Gastrointestinal: Negative for abdominal pain, constipation, nausea and vomiting.  Genitourinary: Negative for dysuria.  Musculoskeletal:  Positive for gait problem. Negative for back pain and joint swelling.       Uses walker since her prior hip fx that led to her moving to AL from her home in [place]: Negative for environmental allergies.  Neurological: Negative for dizziness and weakness.  Hematological: Does not bruise/bleed easily.  Psychiatric/Behavioral: Negative for agitation, behavioral problems, confusion and sleep disturbance. The patient is not nervous/anxious.     Immunization History  Administered Date(s) Administered  . Influenza Inj Mdck Quad Pf 04/14/2016  . Influenza Split 04/08/2013  . Influenza,inj,Quad PF,6+ Mos 03/26/2015, 04/17/2018  . Influenza-Unspecified 04/08/2014, 04/14/2016, 04/17/2017  . Pneumococcal Conjugate-13 06/06/2014  . Pneumococcal Polysaccharide-23 05/02/2018  . Zoster 07/09/2012  . Zoster Recombinat (Shingrix) 07/21/2017, 10/03/2017   Pertinent  Health Maintenance Due  Topic Date Due  . INFLUENZA VACCINE  01/26/2019  . DEXA SCAN  10/25/2023 (Originally 12/17/1998)  . PNA vac Low Risk Adult  Completed   Fall Risk  01/21/2019 10/31/2018  05/02/2018 12/12/2017 10/25/2017  Falls in the past year? 0 0 0 No No  Number falls in past yr: 0 0 0 - -  Injury with Fall? 0 0 0 - -  Risk for fall due to : Impaired balance/gait;Impaired vision - - - -  Follow up Falls evaluation completed;Falls prevention discussed - - - -    Vitals:   02/19/19 1416  BP: 130/70  Pulse: 62  Resp: 16  Temp: 97.8 F (36.6 C)  TempSrc: Oral  SpO2: 98%  Weight: 100 lb (45.4 kg)  Height: 5' 10"  (1.778 m)   Body mass index is 14.35 kg/m. Physical Exam Vitals signs reviewed.  Constitutional:      General: She is not in acute distress.    Appearance: Normal appearance. She is not toxic-appearing.     Comments: Thin female  HENT:     Head: Normocephalic and atraumatic.     Right Ear: External ear normal.     Left Ear: External ear normal.     Nose:     Comments: Nose and mouth  deferred with covid masking Eyes:     Extraocular Movements: Extraocular movements intact.     Pupils: Pupils are equal, round, and reactive to light.  Cardiovascular:     Rate and Rhythm: Normal rate and regular rhythm.     Pulses: Normal pulses.     Heart sounds: Normal heart sounds.  Pulmonary:     Effort: Pulmonary effort is normal.     Breath sounds: Normal breath sounds.  Abdominal:     General: Bowel sounds are normal.  Musculoskeletal: Normal range of motion.     Comments: Walks with rolling walker  Skin:    Comments: Draining mass left breast  Neurological:     General: No focal deficit present.     Mental Status: She is alert and oriented to person, place, and time.     Motor: No weakness.  Psychiatric:        Mood and Affect: Mood normal.        Behavior: Behavior normal.        Thought Content: Thought content normal.        Judgment: Judgment normal.     Labs reviewed: Recent Labs    04/30/18 0700  NA 139  K 4.2  BUN 14  CREATININE 0.4*   Recent Labs    04/30/18 0700  AST 17  ALT 9  ALKPHOS 86   Recent Labs    04/30/18 0700  WBC 4.8  HGB 14.1  HCT 40  PLT 204   No results found for: TSH Lab Results  Component Value Date   HGBA1C 5.8 04/30/2018   Lab Results  Component Value Date   CHOL 198 04/30/2018   HDL 84 (A) 04/30/2018   LDLCALC 101 04/30/2018   TRIG 64 04/30/2018    Significant Diagnostic Results in last 30 days:  US Breast Ltd Uni Left Inc Axilla  Result Date: 02/07/2019 CLINICAL DATA:  Open wound and palpable lump in the left breast. EXAM: DIGITAL DIAGNOSTIC RIGHT MAMMOGRAM WITH CAD AND TOMO ULTRASOUND LEFT BREAST COMPARISON:  Previous exam(s). ACR Breast Density Category c: The breast tissue is heterogeneously dense, which may obscure small masses. FINDINGS: There are scattered round and punctate calcifications in the right breast which are not particularly visible on 3D imaging. There appear to be scattered on the MLO view.  Images of the right breast are limited but otherwise normal. The patient refused  mammographic imaging on the left. Mammographic images were processed with CAD. Targeted ultrasound is performed, showing a large mass in the region of the patient's wound in the left breast. It is difficult to measure the true size of the mass with a mass measures at least 5 x 4 x 4.4 cm and is likely much larger. There is a clearly abnormal lymph node in the left axilla with a cortex measuring up to 16 mm. A second probably abnormal node is identified as well with a cortex measuring 6.5 mm. IMPRESSION: Highly suspicious mass in the left breast in the region of the patient's open wound. One clearly abnormal lymph node with a cortex measuring 16 mm. Probable second abnormal node with a cortex measuring 6.5 mm. RECOMMENDATION: Recommend ultrasound-guided biopsy of the left breast mass at 4 o'clock, 4 cm from the nipple. Recommend biopsying the largest abnormal left axillary node. I have discussed the findings and recommendations with the patient. Results were also provided in writing at the conclusion of the visit. If applicable, a reminder letter will be sent to the patient regarding the next appointment. BI-RADS CATEGORY  4: Suspicious.w Electronically Signed   By: Dorise Bullion III M.D   On: 02/07/2019 12:19   Mm Diag Breast Tomo Uni Right  Result Date: 02/07/2019 CLINICAL DATA:  Open wound and palpable lump in the left breast. EXAM: DIGITAL DIAGNOSTIC RIGHT MAMMOGRAM WITH CAD AND TOMO ULTRASOUND LEFT BREAST COMPARISON:  Previous exam(s). ACR Breast Density Category c: The breast tissue is heterogeneously dense, which may obscure small masses. FINDINGS: There are scattered round and punctate calcifications in the right breast which are not particularly visible on 3D imaging. There appear to be scattered on the MLO view. Images of the right breast are limited but otherwise normal. The patient refused mammographic imaging on the  left. Mammographic images were processed with CAD. Targeted ultrasound is performed, showing a large mass in the region of the patient's wound in the left breast. It is difficult to measure the true size of the mass with a mass measures at least 5 x 4 x 4.4 cm and is likely much larger. There is a clearly abnormal lymph node in the left axilla with a cortex measuring up to 16 mm. A second probably abnormal node is identified as well with a cortex measuring 6.5 mm. IMPRESSION: Highly suspicious mass in the left breast in the region of the patient's open wound. One clearly abnormal lymph node with a cortex measuring 16 mm. Probable second abnormal node with a cortex measuring 6.5 mm. RECOMMENDATION: Recommend ultrasound-guided biopsy of the left breast mass at 4 o'clock, 4 cm from the nipple. Recommend biopsying the largest abnormal left axillary node. I have discussed the findings and recommendations with the patient. Results were also provided in writing at the conclusion of the visit. If applicable, a reminder letter will be sent to the patient regarding the next appointment. BI-RADS CATEGORY  4: Suspicious.w Electronically Signed   By: Dorise Bullion III M.D   On: 02/07/2019 12:19   [place] Axillary Node Core Biopsy Left  Addendum Date: 02/13/2019   ADDENDUM REPORT: 02/12/2019 14:40 ADDENDUM: Pathology revealed GRADE II INVASIVE DUCTAL CARCINOMA. LYMPHOVASCULAR INVASION IS IDENTIFIED of the LEFT breast, 4 o'clock. This was found to be concordant by Dr. Lillia Mountain. Pathology revealed METASTATIC CARCINOMA IN 1 OF 1 LYMPH NODE (1/1) of the LEFT axilla. This was found to be concordant by Dr. Lillia Mountain. Pathology results were discussed with the patient by  telephone. The patient reported doing well after the biopsies with tenderness at the sites. Post biopsy instructions and care were reviewed and questions were answered. The patient was encouraged to call [facility] for any additional  concerns. The patient is scheduled for surgical consultation with Dr. Fanny Skates of [facility] on February 20, 2019. Pathology results reported by Stacie Acres, RN on 02/12/2019. Electronically Signed   By: Lillia Mountain M.D.   On: 02/12/2019 14:40   Result Date: 02/13/2019 CLINICAL DATA:  Left breast mass and axillary adenopathy. EXAM: ULTRASOUND GUIDED LEFT BREAST CORE NEEDLE BIOPSIES COMPARISON:  Previous exam(s). FINDINGS: I met with the patient and we discussed the procedure of ultrasound-guided biopsy, including benefits and alternatives. We discussed the high likelihood of a successful procedure. We discussed the risks of the procedure, including infection, bleeding, tissue injury, clip migration, and inadequate sampling. Informed written consent was given. The usual time-out protocol was performed immediately prior to the procedure. Lesion quadrant: Lower outer quadrant Using sterile technique and 1% lidocaine and 1% lidocaine with epinephrine as local anesthetic, under direct ultrasound visualization, a 14 gauge spring-loaded device was used to perform biopsy of a mass in the 4 o'clock region of the left breast using a medial to lateral approach. At the conclusion of the procedure a ribbon shaped tissue marker clip was deployed into the biopsy cavity. Follow up 2 view mammogram was performed and dictated separately. I met with the patient and we discussed the procedure of ultrasound-guided biopsy, including benefits and alternatives. We discussed the high likelihood of a successful procedure. We discussed the risks of the procedure, including infection, bleeding, tissue injury, clip migration, and inadequate sampling. Informed written consent was given. The usual time-out protocol was performed immediately prior to the procedure. Lesion quadrant: Left axilla Using sterile technique and 1% lidocaine and 1% lidocaine with epinephrine as local anesthetic, under direct ultrasound visualization, a  14 gauge spring-loaded device was used to perform biopsy of the left axilla using an inferior to superior approach. At the conclusion of the procedure a HydroMARK tissue marker clip was deployed into the biopsy cavity. Follow up 2 view mammogram was performed and dictated separately. IMPRESSION: Ultrasound guided biopsies of the left breast and left axilla. No apparent complications. Electronically Signed: By: Lillia Mountain M.D. On: 02/11/2019 13:46   Mm Clip Placement Left  Result Date: 02/11/2019 CLINICAL DATA:  Status post ultrasound-guided core biopsies of a mass in the left breast and the left axilla. Images are limited secondary to the patient not being able to tolerate compression. EXAM: 3D DIAGNOSTIC LEFT MAMMOGRAM POST ULTRASOUND GUIDED BIOPSIES COMPARISON:  Previous exam(s). FINDINGS: 3D Mammographic images were obtained following ultrasound guided biopsies of the left breast and left axilla. Mammographic images show a large mass with pleomorphic calcifications in the lateral aspect of the breast. The posterior aspect of the mass was not fully imaged. The ribbon shaped clip is not visualized on these images likely secondary to it being in a more posterior location. The clip in the left axilla is also not visualized on the images. IMPRESSION: Status post ultrasound-guided core biopsies of the left breast and the left axilla. Neither clip was visualized on the limited mammographic images. Final Assessment: Post Procedure Mammograms for Marker Placement Electronically Signed   By: Lillia Mountain M.D.   On: 02/11/2019 13:51   [place] Lt Breast Bx W Loc Dev 1st Lesion Img Bx Spec US Guide  Addendum Date: 02/13/2019  ADDENDUM REPORT: 02/12/2019 14:40 ADDENDUM: Pathology revealed GRADE II INVASIVE DUCTAL CARCINOMA. LYMPHOVASCULAR INVASION IS IDENTIFIED of the LEFT breast, 4 o'clock. This was found to be concordant by Dr. Lillia Mountain. Pathology revealed METASTATIC CARCINOMA IN 1 OF 1 LYMPH NODE (1/1) of the LEFT  axilla. This was found to be concordant by Dr. Lillia Mountain. Pathology results were discussed with the patient by telephone. The patient reported doing well after the biopsies with tenderness at the sites. Post biopsy instructions and care were reviewed and questions were answered. The patient was encouraged to call [facility] for any additional concerns. The patient is scheduled for surgical consultation with Dr. Fanny Skates of [facility] on February 20, 2019. Pathology results reported by Stacie Acres, RN on 02/12/2019. Electronically Signed   By: Lillia Mountain M.D.   On: 02/12/2019 14:40   Result Date: 02/13/2019 CLINICAL DATA:  Left breast mass and axillary adenopathy. EXAM: ULTRASOUND GUIDED LEFT BREAST CORE NEEDLE BIOPSIES COMPARISON:  Previous exam(s). FINDINGS: I met with the patient and we discussed the procedure of ultrasound-guided biopsy, including benefits and alternatives. We discussed the high likelihood of a successful procedure. We discussed the risks of the procedure, including infection, bleeding, tissue injury, clip migration, and inadequate sampling. Informed written consent was given. The usual time-out protocol was performed immediately prior to the procedure. Lesion quadrant: Lower outer quadrant Using sterile technique and 1% lidocaine and 1% lidocaine with epinephrine as local anesthetic, under direct ultrasound visualization, a 14 gauge spring-loaded device was used to perform biopsy of a mass in the 4 o'clock region of the left breast using a medial to lateral approach. At the conclusion of the procedure a ribbon shaped tissue marker clip was deployed into the biopsy cavity. Follow up 2 view mammogram was performed and dictated separately. I met with the patient and we discussed the procedure of ultrasound-guided biopsy, including benefits and alternatives. We discussed the high likelihood of a successful procedure. We discussed the risks of the  procedure, including infection, bleeding, tissue injury, clip migration, and inadequate sampling. Informed written consent was given. The usual time-out protocol was performed immediately prior to the procedure. Lesion quadrant: Left axilla Using sterile technique and 1% lidocaine and 1% lidocaine with epinephrine as local anesthetic, under direct ultrasound visualization, a 14 gauge spring-loaded device was used to perform biopsy of the left axilla using an inferior to superior approach. At the conclusion of the procedure a HydroMARK tissue marker clip was deployed into the biopsy cavity. Follow up 2 view mammogram was performed and dictated separately. IMPRESSION: Ultrasound guided biopsies of the left breast and left axilla. No apparent complications. Electronically Signed: By: Lillia Mountain M.D. On: 02/11/2019 13:46    Assessment/Plan 1. Invasive ductal carcinoma of breast, female, left ([place]) -with axillary node mets -agreeable to surgery for comfort purposes, but does not want life-prolonging interventions  2. Metastatic disease ([place]) -noted in axilla  3. Legal blindness -this is why she does not want her life prolonged--she says she has nothing to live for and does not want to end her life being totally blind and dependent on others to choose her clothes, feed her--she'd rather die from cancer  4. ACP (advance care planning) -pt agreeable to palliative surgery, but does not want any other measures that will prolong her life--she's quite clear on this -f/u with surgery as planned -it will be helpful to have more prognostic information to determine hospice eligibility for her based on her  goals of care -Nayanna says "I have nothing to live for.  I know that sounds bad, but, I've already done all of these things.  Everyone needs to have someone to love, something to do, and something to look forward to."  -50 mins spent on ACP with [place] today  Family/ staff Communication: discussed with pt and  DON  Labs/tests ordered:  Keep appt with surgery  Malayla Granberry L. Avilyn Virtue, D.O. [facility] [address], [place] [address] Cell Phone (Mon-Fri 8am-5pm):  [phone] On Call:  [phone] & follow prompts after 5pm & weekends Office Phone:  [phone] Office Fax:  [phone]

## 2019-02-20 ENCOUNTER — Encounter: Payer: Self-pay | Admitting: Adult Health

## 2019-02-20 DIAGNOSIS — C50912 Malignant neoplasm of unspecified site of left female breast: Secondary | ICD-10-CM | POA: Diagnosis not present

## 2019-02-20 DIAGNOSIS — H3530 Unspecified macular degeneration: Secondary | ICD-10-CM | POA: Diagnosis not present

## 2019-02-20 DIAGNOSIS — C50512 Malignant neoplasm of lower-outer quadrant of left female breast: Secondary | ICD-10-CM | POA: Insufficient documentation

## 2019-02-20 DIAGNOSIS — S7292XA Unspecified fracture of left femur, initial encounter for closed fracture: Secondary | ICD-10-CM | POA: Diagnosis not present

## 2019-02-20 DIAGNOSIS — E119 Type 2 diabetes mellitus without complications: Secondary | ICD-10-CM | POA: Diagnosis not present

## 2019-02-20 DIAGNOSIS — M810 Age-related osteoporosis without current pathological fracture: Secondary | ICD-10-CM | POA: Diagnosis not present

## 2019-02-22 DIAGNOSIS — C799 Secondary malignant neoplasm of unspecified site: Secondary | ICD-10-CM | POA: Insufficient documentation

## 2019-02-22 DIAGNOSIS — C50912 Malignant neoplasm of unspecified site of left female breast: Secondary | ICD-10-CM | POA: Insufficient documentation

## 2019-02-25 NOTE — Addendum Note (Signed)
Addended by: Hollace Kinnier L on: 02/25/2019 12:15 PM   Modules accepted: Level of Service

## 2019-02-25 NOTE — Progress Notes (Signed)
[facility] CONSULT NOTE  Patient Care Team: Gayland Curry, DO as PCP - General (Geriatric Medicine) Gerarda Fraction, MD as Referring Physician (Ophthalmology)  CHIEF COMPLAINTS/PURPOSE OF CONSULTATION:  Newly diagnosed breast cancer  HISTORY OF PRESENTING ILLNESS:  Krystal [name] 85 y.o. female is here because of recent diagnosis of invasive ductal carcinoma of the left breast. The cancer was diagnosed on a right diagnostic mammogram and left breast US on 02/07/19 after the patient had a right breast wound and palpable mass. It showed calcifications in the right breast and a mass in the region of the wound in the left breast measuring at least 5cm. There were two abnormal left axillary lymph nodes measuring 1.0cm and 0.6cm. Biopsy on 02/11/19 revealed invasive ductal carcinoma, grade 2, HER-2 positive (3+), ER/PR negative, Ki67 15%, metastatic carcinoma in 1 lymph node. She presents to the clinic today for initial evaluation and discussion of treatment options.   I reviewed her records extensively and collaborated the history with the patient.  SUMMARY OF ONCOLOGIC HISTORY: Oncology History  Malignant neoplasm of lower-outer quadrant of left breast of female, estrogen receptor negative ([place])  02/11/2019 Cancer Staging   Staging form: Breast, AJCC 8th Edition - Clinical stage from 02/11/2019: Stage IIIB (cT4b, cN1, cM0, G2, ER-, PR-, HER2+) - Signed by Gardenia Phlegm, NP on 02/20/2019   02/20/2019 Initial Diagnosis   Right breast mammogram and left breast US after patient had a right breast wound and palpable mass showed a mass in the region of the wound in the left breast measuring at least 5cm and two abnormal left axillary lymph nodes measuring 1.0cm and 0.6cm. Biopsy revealed IDC, grade 2, HER-2 + (3+), ER/PR -, Ki67 15%, metastatic carcinoma in 1 lymph node.      MEDICAL HISTORY:  Past Medical History:  Diagnosis Date  . Anxiety   . Arthritis   . Back pain   .  Borderline diabetes   . Compression fracture of lumbar vertebra ([place]) 2013  . Constipation   . Displaced intertrochanteric fracture of left femur ([place])   . Fracture of unspecified part of neck of unspecified femur, initial encounter for closed fracture ([place])   . GERD (gastroesophageal reflux disease)   . Hyperglycemia   . Hypo-osmolality and hyponatremia   . Macular degeneration    both eyes, blind  . Osteoporosis     SURGICAL HISTORY: Past Surgical History:  Procedure Laterality Date  . CATARACT EXTRACTION    . HIP FRACTURE SURGERY Left 2016  . INTRAMEDULLARY (IM) NAIL INTERTROCHANTERIC Left 03/24/2015   Procedure: INTRAMEDULLARY (IM) NAIL INTERTROCHANTRIC;  Surgeon: Rod Can, MD;  Location: [facility];  Service: Orthopedics;  Laterality: Left;  . TONSILLECTOMY      SOCIAL HISTORY: Social History   Socioeconomic History  . Marital status: Single    Spouse name: Not on file  . Number of children: Not on file  . Years of education: Not on file  . Highest education level: Not on file  Occupational History  . Occupation: retired  Scientific laboratory technician  . Financial resource strain: Not hard at all  . Food insecurity    Worry: Never true    Inability: Never true  . Transportation needs    Medical: No    Non-medical: No  Tobacco Use  . Smoking status: Former Smoker    Quit date: 06/27/1965    Years since quitting: 53.7  . Smokeless tobacco: Never Used  Substance and Sexual Activity  . Alcohol  use: Never    Frequency: Never    Comment: wine occasional  . Drug use: No  . Sexual activity: Never  Lifestyle  . Physical activity    Days per week: 7 days    Minutes per session: 60 min  . Stress: To some extent  Relationships  . Social connections    Talks on phone: More than three times a week    Gets together: More than three times a week    Attends religious service: Never    Active member of club or organization: No    Attends meetings of clubs or organizations: Never     Relationship status: Never married  . Intimate partner violence    Fear of current or ex partner: No    Emotionally abused: No    Physically abused: No    Forced sexual activity: No  Other Topics Concern  . 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 Cancer Mother        breast  . Breast cancer Mother 43  . Heart disease Father     ALLERGIES:  has No Known Allergies.  MEDICATIONS:  Current Outpatient Medications  Medication Sig Dispense Refill  . acetaminophen (TYLENOL) 500 MG tablet Take 1,000 mg by mouth every 6 (six) hours as needed. For pain.    . cholecalciferol (VITAMIN D) 1000 UNITS tablet Take 2,000 Units by mouth daily.    . dorzolamide-timolol (COSOPT) 22.3-6.8 MG/ML ophthalmic solution Place 1 drop into the right eye 2 (two) times daily.    . Multiple Vitamin (MULITIVITAMIN WITH MINERALS) TABS Take 1 tablet by mouth daily.    Marland Kitchen omega-3 acid ethyl esters (LOVAZA) 1 G capsule Take 1 g by mouth daily.    . TRAVATAN Z 0.004 % SOLN ophthalmic solution Place 1 drop into the right eye at bedtime.   10   No current facility-administered medications for this visit.     REVIEW OF SYSTEMS:   Constitutional: Denies fevers, chills or abnormal night sweats Eyes: Denies blurriness of vision, double vision or watery eyes Ears, nose, mouth, throat, and face: Denies mucositis or sore throat Respiratory: Denies cough, dyspnea or wheezes Cardiovascular: Denies palpitation, chest discomfort or lower extremity swelling Gastrointestinal:  Denies nausea, heartburn or change in bowel habits  Skin: Denies abnormal skin rashes Lymphatics: Denies new lymphadenopathy or easy bruising Neurological:Denies numbness, tingling or new weaknesses Behavioral/Psych: Mood is stable, no new changes  Breast: Palpable left breast mass and open wound All other systems were reviewed with the patient and are negative.  PHYSICAL EXAMINATION: ECOG PERFORMANCE STATUS: 3 - Symptomatic, >50% confined to bed  Vitals:   02/26/19 1412  BP: (!) 161/72  Pulse: 72  Resp: 16  Temp: 98.9 F (37.2 C)  SpO2: 99%   Filed Weights   02/26/19 1412  Weight: 100 lb 14.4 oz (45.8 kg)    GENERAL:alert, no distress and comfortable SKIN: skin color, texture, turgor are normal, no rashes or significant lesions EYES: normal, conjunctiva are pink and non-injected, sclera clear OROPHARYNX:no  exudate, no erythema and lips, buccal mucosa, and tongue normal  NECK: supple, thyroid normal size, non-tender, without nodularity LYMPH:  no palpable lymphadenopathy in the cervical, axillary or inguinal LUNGS: clear to auscultation and percussion with normal breathing effort HEART: regular rate & rhythm and no murmurs and no lower extremity edema ABDOMEN:abdomen soft, non-tender and normal bowel sounds Musculoskeletal:no cyanosis of digits and no clubbing  PSYCH: alert & oriented x 3 with fluent speech NEURO: no focal motor/sensory deficits BREAST: Fungating tumor of the right breast with an open wound  LABORATORY DATA:  I have reviewed the data as listed Lab Results  Component Value Date   WBC 4.8 04/30/2018   HGB 14.1 04/30/2018   HCT 40 04/30/2018   MCV 93.5 03/26/2015   PLT 204 04/30/2018   Lab Results  Component Value Date   NA 139 04/30/2018   K 4.2 04/30/2018   CL 101 03/26/2015   CO2 26 03/26/2015    RADIOGRAPHIC STUDIES: I have personally reviewed the radiological reports and agreed with the findings in the report.  ASSESSMENT AND PLAN:  Malignant neoplasm of lower-outer quadrant of left  breast of female, estrogen receptor negative ([place]) Right breast mammogram and left breast US after patient had a right breast wound and palpable mass showed a mass in the region of the wound in the left breast measuring at least 5cm and two abnormal left axillary lymph nodes measuring 1.0cm and 0.6cm. Biopsy revealed IDC, grade 2, HER-2 + (3+), ER/PR -, Ki67 15%, metastatic carcinoma in 1 lymph node.   Stage IIIb  Counseling: I discussed the pathology report in great detail for the patient.  I wrote everything down in spite of the fact that she is not able to see.  She wanted me to write it down so that she can magnify it and we did later.  Treatment plan: 1.  Palliative mastectomy with targeted node surgery to remove the involved lymph nodes 2.  Discussed the pros and cons of radiation and she decided that she does not want to do any radiation. 3.  Because of her advanced age and her wishes we will not be doing any systemic treatments like anti-HER-2 therapy.  We had a fascinating lengthy discussion about her early life and career and she is extremely clear about her wishes to be kept comfortable. Patient can be seen on an as-needed basis.   All questions were answered. The patient knows to call the clinic with any problems, questions or concerns.   Rulon Eisenmenger, MD 02/26/2019    I, Molly Dorshimer, am acting as scribe for Nicholas Lose, MD.  I have reviewed the above documentation for accuracy and completeness, and I agree with the above.

## 2019-02-26 ENCOUNTER — Encounter: Payer: Self-pay | Admitting: *Deleted

## 2019-02-26 ENCOUNTER — Inpatient Hospital Stay: Payer: Medicare Other | Attending: Hematology and Oncology | Admitting: Hematology and Oncology

## 2019-02-26 ENCOUNTER — Other Ambulatory Visit: Payer: Self-pay

## 2019-02-26 DIAGNOSIS — Z87891 Personal history of nicotine dependence: Secondary | ICD-10-CM | POA: Diagnosis not present

## 2019-02-26 DIAGNOSIS — Z79899 Other long term (current) drug therapy: Secondary | ICD-10-CM | POA: Diagnosis not present

## 2019-02-26 DIAGNOSIS — K219 Gastro-esophageal reflux disease without esophagitis: Secondary | ICD-10-CM | POA: Insufficient documentation

## 2019-02-26 DIAGNOSIS — Z171 Estrogen receptor negative status [ER-]: Secondary | ICD-10-CM

## 2019-02-26 DIAGNOSIS — C50512 Malignant neoplasm of lower-outer quadrant of left female breast: Secondary | ICD-10-CM | POA: Diagnosis not present

## 2019-02-26 DIAGNOSIS — F419 Anxiety disorder, unspecified: Secondary | ICD-10-CM | POA: Insufficient documentation

## 2019-02-26 DIAGNOSIS — C773 Secondary and unspecified malignant neoplasm of axilla and upper limb lymph nodes: Secondary | ICD-10-CM | POA: Diagnosis not present

## 2019-02-26 DIAGNOSIS — M1990 Unspecified osteoarthritis, unspecified site: Secondary | ICD-10-CM | POA: Diagnosis not present

## 2019-02-26 NOTE — Assessment & Plan Note (Signed)
Right breast mammogram and left breast US after patient had a right breast wound and palpable mass showed a mass in the region of the wound in the left breast measuring at least 5cm and two abnormal left axillary lymph nodes measuring 1.0cm and 0.6cm. Biopsy revealed IDC, grade 2, HER-2 + (3+), ER/PR -, Ki67 15%, metastatic carcinoma in 1 lymph node.   Stage IIIb  Counseling: I discussed the pathology report in great detail for the patient.  I wrote everything down in spite of the fact that she is not able to see.  She wanted me to write it down so that she can magnify it and we did later.  Treatment plan: 1.  Palliative mastectomy with targeted node surgery to remove the involved lymph nodes 2.  Discussed the pros and cons of radiation and she decided that she does not want to do any radiation. 3.  Because of her advanced age and her wishes we will not be doing any systemic treatments like anti-HER-2 therapy.  We had a fascinating lengthy discussion about her early life and career and she is extremely clear about her wishes to be kept comfortable. Patient can be seen on an as-needed basis.

## 2019-02-27 ENCOUNTER — Other Ambulatory Visit: Payer: Self-pay | Admitting: General Surgery

## 2019-03-09 NOTE — H&P (Signed)
Krystal [name] Location: [facility] Patient #: [id number] DOB: Oct 27, 1931 Single / Language: Krystal [name] / Race: White Female       History of Present Illness       . This is a very pleasant, very intelligent 85 year old woman who lives at [facility]. She returns to see me for discussion regarding management options for her locally advanced cancer of the left breast with lymph node metastasis in skin invasion her PCP is Krystal [name], D.O., at [facility]. Krystal [name] was my chaperone.She will see Dr. [name] next week on Tuesday.       She reports 2-3 months history of a lump in her left breast and eventually this became a little bit uncomfortable and she noted bleeding and draining. This was thought to be an abscess. She was seen in our urgent office and referred for imaging studies. Bilateral mammograms, and biopsies were performed. 5 cm mass in the central left breast and 2 abnormal lymph nodes. The breast and lymph nodes were biopsied and showed grade 2 invasive ductal carcinoma. Positive LVI. ER and PR negative. Ki-67 15%. HER-2 positive. She says the mammograms were very painful. Discussed in conference this morning. Consideration will be given to staging studies before making any surgical decisions in case we find metastatic disease that would impact survival She is going to see Dr. [name] next Tuesday at 2:15 My thoughts are that if she does not have metastatic disease that palliative mastectomy should be considered to palliate pain bleeding and drainage. Right now  I think we can do a mastectomy since the tumor is not invading the chest wall. The patient tends to agree with this philosophy but wants  the multidisciplinary approach prior to a surgical decision.     The patient has made it very clear that she does not want any interventions that would prolong her life. Certainly does not think she could tolerate daily trips for radiation therapy. She is very  depressed about her blindness.      Comorbidities include macular degeneration with severe visual disturbances. She sees shapes and light. She can ambulate down the hall with a walker next to the handrail. Diabetes. Osteoporosis. History history left femur fracture. Otherwise she is bright and alert intelligent with excellent insight Lives at [facility]      We had a long discussion today. One of the caregivers from [facility] was with her. She will see Dr. [name] next week I will see her back in about 2 weeks to see if it is appropriate to proceed with palliative mastectomy    Addendum Note(Krystal Laborde M. Dalbert Batman MD; 02/27/2019 5:46 PM) She has seen Dr. [name]. He agrees and recommends palliative left mastectomy and removal of lymph nodes We called her today and asked her what she wanted to do It was her preference to go ahead and schedule surgery  Hopefully we can expedite surgery   Allergies  No Known Drug Allergies   No Known Allergies Allergies Reconciled   Medication History  Dorzolamide HCl-Timolol Mal (22.3-6.'8MG'$ /ML Solution, Ophthalmic) Active. Travatan Z (0.004% Solution, Ophthalmic) Active. Medications Reconciled  Vitals  Weight: 101.38 lb Height: 69in Body Surface Area: 1.55 m Body Mass Index: 14.97 kg/m  Temp.: 98.23F (Temporal)  Pulse: 98 (Regular)  BP: 106/62(Sitting, Left Arm, Standard)     Physical Exam  General Mental Status-Alert. General Appearance-Consistent with stated age. Hydration-Well hydrated. Voice-Normal. Note: 10. Ambulating with walker. Alert. No distress. Excellent insight and understanding. No apparent cognitive disorder.   Head  and Neck Head-normocephalic, atraumatic with no lesions or palpable masses. Trachea-midline. Thyroid Gland Characteristics - normal size and consistency.  Eye Eyeball - Bilateral-Extraocular movements intact. Sclera/Conjunctiva - Bilateral-No scleral icterus.  Chest  and Lung Exam Chest and lung exam reveals -quiet, even and easy respiratory effort with no use of accessory muscles and on auscultation, normal breath sounds, no adventitious sounds and normal vocal resonance. Inspection Chest Wall - Normal. Back - normal.  Breast Note: Breasts are atrophic. There is a 5 cm mass in the left breast which is eroding the skin and bleeding. It is not fixed to the chest wall and mobile. There is palpable lymph nodes in the left axilla.   Cardiovascular Cardiovascular examination reveals -normal heart sounds, regular rate and rhythm with no murmurs and normal pedal pulses bilaterally.  Abdomen Inspection Inspection of the abdomen reveals - No Hernias. Skin - Scar - no surgical scars. Palpation/Percussion Palpation and Percussion of the abdomen reveal - Soft, Non Tender, No Rebound tenderness, No Rigidity (guarding) and No hepatosplenomegaly. Auscultation Auscultation of the abdomen reveals - Bowel sounds normal.  Neurologic Neurologic evaluation reveals -alert and oriented x 3 with no impairment of recent or remote memory. Mental Status-Normal.  Musculoskeletal Normal Exam - Left-Upper Extremity Strength Normal and Lower Extremity Strength Normal. Normal Exam - Right-Upper Extremity Strength Normal and Lower Extremity Strength Normal.  Lymphatic Head & Neck  General Head & Neck Lymphatics: Bilateral - Description - Normal. Axillary  General Axillary Region: Bilateral - Description - Normal. Tenderness - Non Tender. Femoral & Inguinal  Generalized Femoral & Inguinal Lymphatics: Bilateral - Description - Normal. Tenderness - Non Tender.    Assessment & Plan   CANCER OF LEFT BREAST, STAGE 3 (C50.912)  you have a locally advanced cancer of the left breast This is at least 5 cm in diameter and has invaded the skin and is now bleeding and draining you also have cancer that has spread to the lymph nodes under the arm. We can feel these  lymph nodes and the biopsy confirms this  We are well aware of your goals of care and desires to avoid life prolonging treatments That is reasonable and we will honor your requests. However, other than your macular degeneration you seemed to be stable from a medical standpoint If we do nothing the breast cancer will enlarge and continue to bleed and drain and pain is likely. Surgery for control os symptoms is advised. At this point in time, the cancer has not invaded the muscles or ribs and I think we can do a mastectomy  My advice is to consider a left mastectomy and removal of the lymph nodes under your arm so as to control symptoms in the future At surgery by itself will not prolong your life but it will make the symptoms go away Before any surgery is planned you will see one of the medical oncologists, Dr. [name], next week on Tuesday at 2:15 PM    MACULAR DEGENERATION (H35.30) Impression: She is legally blind. She sees light and shapes. She ambulates at [facility] with a walker down the hall  DIABETES MELLITUS, WITHOUT LONG-TERM CURRENT USE OF INSULIN (E11.9)  FEMUR FRACTURE, LEFT (S72.92XA)  OSTEOPOROSIS (M81.0)    Edsel Petrin. Dalbert [name], M.D., [facility] General and Minimally invasive Surgery Breast and Colorectal Surgery Office:   [phone] Pager:   [phone]

## 2019-03-12 ENCOUNTER — Other Ambulatory Visit (HOSPITAL_COMMUNITY)
Admission: RE | Admit: 2019-03-12 | Discharge: 2019-03-12 | Disposition: A | Discharge status: Home / Self Care | Payer: Medicare Other | Source: Ambulatory Visit | Attending: General Surgery | Admitting: General Surgery

## 2019-03-12 DIAGNOSIS — Z20828 Contact with and (suspected) exposure to other viral communicable diseases: Secondary | ICD-10-CM | POA: Insufficient documentation

## 2019-03-12 DIAGNOSIS — Z01812 Encounter for preprocedural laboratory examination: Secondary | ICD-10-CM | POA: Diagnosis not present

## 2019-03-13 LAB — NOVEL CORONAVIRUS, NAA (HOSP ORDER, SEND-OUT TO REF LAB; TAT 18-24 HRS): SARS-CoV-2, NAA: NOT DETECTED

## 2019-03-14 NOTE — Anesthesia Preprocedure Evaluation (Addendum)
Anesthesia Evaluation  Patient identified by MRN, date of birth, ID band Patient awake    Reviewed: Allergy & Precautions, NPO status , Patient's Chart, lab work & pertinent test results  History of Anesthesia Complications Negative for: history of anesthetic complications  Airway Mallampati: II  TM Distance: >3 FB Neck ROM: Full    Dental  (+) Dental Advisory Given, Teeth Intact   Pulmonary former smoker,    Pulmonary exam normal        Cardiovascular negative cardio ROS Normal cardiovascular exam     Neuro/Psych PSYCHIATRIC DISORDERS Anxiety  Blindness     GI/Hepatic Neg liver ROS, GERD  Controlled,  Endo/Other   Borderline DM   Renal/GU negative Renal ROS     Musculoskeletal  (+) Arthritis ,   Abdominal   Peds  Hematology negative hematology ROS (+)   Anesthesia Other Findings   Reproductive/Obstetrics  Breast cancer                             Anesthesia Physical Anesthesia Plan  ASA: III  Anesthesia Plan: General   Post-op Pain Management:  Regional for Post-op pain   Induction: Intravenous  PONV Risk Score and Plan: 3 and Treatment may vary due to age or medical condition, Ondansetron and Propofol infusion  Airway Management Planned: LMA  Additional Equipment: None  Intra-op Plan:   Post-operative Plan: Extubation in OR  Informed Consent: I have reviewed the patients History and Physical, chart, labs and discussed the procedure including the risks, benefits and alternatives for the proposed anesthesia with the patient or authorized representative who has indicated his/her understanding and acceptance.     Dental advisory given  Plan Discussed with: CRNA and Anesthesiologist  Anesthesia Plan Comments:        Anesthesia Quick Evaluation

## 2019-03-14 NOTE — Progress Notes (Signed)
Pre-op instructions faxed to pt nurse, Tomi Bamberger, LPN at [facility]. Nurse confirmed receipt of fax and verbalized understanding of all pre-op instructions. Nurse stated that pt is able to complete her assessment on DOS.

## 2019-03-15 ENCOUNTER — Other Ambulatory Visit: Payer: Self-pay

## 2019-03-15 ENCOUNTER — Ambulatory Visit (HOSPITAL_COMMUNITY): Payer: Medicare Other | Admitting: Certified Registered Nurse Anesthetist

## 2019-03-15 ENCOUNTER — Observation Stay (HOSPITAL_COMMUNITY)
Admission: RE | Admit: 2019-03-15 | Discharge: 2019-03-16 | Disposition: A | Discharge status: Skilled Nursing Facility | Payer: Medicare Other | Attending: General Surgery | Admitting: General Surgery

## 2019-03-15 ENCOUNTER — Encounter (HOSPITAL_COMMUNITY): Payer: Self-pay

## 2019-03-15 ENCOUNTER — Encounter (HOSPITAL_COMMUNITY)
Admission: RE | Disposition: A | Discharge status: Skilled Nursing Facility | Payer: Medicare Other | Source: Home / Self Care | Attending: General Surgery

## 2019-03-15 DIAGNOSIS — Z79899 Other long term (current) drug therapy: Secondary | ICD-10-CM | POA: Diagnosis not present

## 2019-03-15 DIAGNOSIS — C50912 Malignant neoplasm of unspecified site of left female breast: Secondary | ICD-10-CM | POA: Diagnosis not present

## 2019-03-15 DIAGNOSIS — Z87891 Personal history of nicotine dependence: Secondary | ICD-10-CM | POA: Diagnosis not present

## 2019-03-15 DIAGNOSIS — K219 Gastro-esophageal reflux disease without esophagitis: Secondary | ICD-10-CM | POA: Diagnosis not present

## 2019-03-15 DIAGNOSIS — H3530 Unspecified macular degeneration: Secondary | ICD-10-CM | POA: Diagnosis not present

## 2019-03-15 DIAGNOSIS — M1990 Unspecified osteoarthritis, unspecified site: Secondary | ICD-10-CM | POA: Insufficient documentation

## 2019-03-15 DIAGNOSIS — F329 Major depressive disorder, single episode, unspecified: Secondary | ICD-10-CM | POA: Diagnosis not present

## 2019-03-15 DIAGNOSIS — E119 Type 2 diabetes mellitus without complications: Secondary | ICD-10-CM | POA: Insufficient documentation

## 2019-03-15 DIAGNOSIS — Z20828 Contact with and (suspected) exposure to other viral communicable diseases: Secondary | ICD-10-CM | POA: Insufficient documentation

## 2019-03-15 DIAGNOSIS — Z171 Estrogen receptor negative status [ER-]: Secondary | ICD-10-CM

## 2019-03-15 DIAGNOSIS — F419 Anxiety disorder, unspecified: Secondary | ICD-10-CM | POA: Insufficient documentation

## 2019-03-15 DIAGNOSIS — C50512 Malignant neoplasm of lower-outer quadrant of left female breast: Secondary | ICD-10-CM | POA: Diagnosis not present

## 2019-03-15 DIAGNOSIS — H548 Legal blindness, as defined in USA: Secondary | ICD-10-CM

## 2019-03-15 DIAGNOSIS — G8918 Other acute postprocedural pain: Secondary | ICD-10-CM | POA: Diagnosis not present

## 2019-03-15 DIAGNOSIS — H547 Unspecified visual loss: Secondary | ICD-10-CM

## 2019-03-15 DIAGNOSIS — M810 Age-related osteoporosis without current pathological fracture: Secondary | ICD-10-CM | POA: Insufficient documentation

## 2019-03-15 DIAGNOSIS — Z794 Long term (current) use of insulin: Secondary | ICD-10-CM | POA: Diagnosis not present

## 2019-03-15 DIAGNOSIS — C779 Secondary and unspecified malignant neoplasm of lymph node, unspecified: Secondary | ICD-10-CM | POA: Diagnosis not present

## 2019-03-15 DIAGNOSIS — C773 Secondary and unspecified malignant neoplasm of axilla and upper limb lymph nodes: Secondary | ICD-10-CM | POA: Diagnosis not present

## 2019-03-15 DIAGNOSIS — C799 Secondary malignant neoplasm of unspecified site: Secondary | ICD-10-CM | POA: Diagnosis present

## 2019-03-15 HISTORY — PX: MASTECTOMY WITH AXILLARY LYMPH NODE DISSECTION: SHX5661

## 2019-03-15 LAB — COMPREHENSIVE METABOLIC PANEL
ALT: 12 U/L (ref 0–44)
AST: 19 U/L (ref 15–41)
Albumin: 3.6 g/dL (ref 3.5–5.0)
Alkaline Phosphatase: 70 U/L (ref 38–126)
Anion gap: 9 (ref 5–15)
BUN: 12 mg/dL (ref 8–23)
CO2: 28 mmol/L (ref 22–32)
Calcium: 9.1 mg/dL (ref 8.9–10.3)
Chloride: 96 mmol/L — ABNORMAL LOW (ref 98–111)
Creatinine, Ser: 0.52 mg/dL (ref 0.44–1.00)
GFR calc Af Amer: >60 mL/min (ref >60)
GFR calc non Af Amer: >60 mL/min (ref >60)
Glucose, Bld: 101 mg/dL — ABNORMAL HIGH (ref 70–99)
Potassium: 3.4 mmol/L — ABNORMAL LOW (ref 3.5–5.1)
Sodium: 133 mmol/L — ABNORMAL LOW (ref 135–145)
Total Bilirubin: 0.9 mg/dL (ref 0.3–1.2)
Total Protein: 7.1 g/dL (ref 6.5–8.1)

## 2019-03-15 LAB — CBC WITH DIFFERENTIAL/PLATELET
Abs Immature Granulocytes: 0.03 10*3/uL (ref 0.00–0.07)
Basophils Absolute: 0.1 10*3/uL (ref 0.0–0.1)
Basophils Relative: 1 %
Eosinophils Absolute: 0.2 10*3/uL (ref 0.0–0.5)
Eosinophils Relative: 4 %
HCT: 38.7 % (ref 36.0–46.0)
Hemoglobin: 13.1 g/dL (ref 12.0–15.0)
Immature Granulocytes: 1 %
Lymphocytes Relative: 21 %
Lymphs Abs: 1.2 10*3/uL (ref 0.7–4.0)
MCH: 32.5 pg (ref 26.0–34.0)
MCHC: 33.9 g/dL (ref 30.0–36.0)
MCV: 96 fL (ref 80.0–100.0)
Monocytes Absolute: 0.5 10*3/uL (ref 0.1–1.0)
Monocytes Relative: 8 %
Neutro Abs: 3.9 10*3/uL (ref 1.7–7.7)
Neutrophils Relative %: 65 %
Platelets: 215 10*3/uL (ref 150–400)
RBC: 4.03 MIL/uL (ref 3.87–5.11)
RDW: 13.7 % (ref 11.5–15.5)
WBC: 5.9 10*3/uL (ref 4.0–10.5)
nRBC: 0 % (ref 0.0–0.2)

## 2019-03-15 SURGERY — MASTECTOMY WITH AXILLARY LYMPH NODE DISSECTION
Anesthesia: General | Site: Breast | Laterality: Left

## 2019-03-15 MED ORDER — HYDROCODONE-ACETAMINOPHEN 5-325 MG PO TABS: 1.0000 | ORAL_TABLET | ORAL | Status: DC | PRN

## 2019-03-15 MED ORDER — ONDANSETRON HCL 4 MG/2ML IJ SOLN: 4.0000 mg | Freq: Four times a day (QID) | INTRAMUSCULAR | Status: DC | PRN

## 2019-03-15 MED ORDER — FENTANYL CITRATE (PF) 100 MCG/2ML IJ SOLN
25.0000 ug | INTRAMUSCULAR | Status: DC | PRN
  Administered 2019-03-15: 12.5 ug via INTRAVENOUS

## 2019-03-15 MED ORDER — DEXAMETHASONE SODIUM PHOSPHATE 10 MG/ML IJ SOLN
INTRAMUSCULAR | Status: DC | PRN
  Administered 2019-03-15: 10 mg via INTRAVENOUS

## 2019-03-15 MED ORDER — SENNA 8.6 MG PO TABS
1.0000 | ORAL_TABLET | Freq: Two times a day (BID) | ORAL | Status: DC
  Administered 2019-03-15 (×2): 8.6 mg via ORAL
  Filled 2019-03-15 (×2): qty 1

## 2019-03-15 MED ORDER — DEXAMETHASONE SODIUM PHOSPHATE 10 MG/ML IJ SOLN
INTRAMUSCULAR | Status: AC
  Filled 2019-03-15: qty 1

## 2019-03-15 MED ORDER — ACETAMINOPHEN 500 MG PO TABS
1000.0000 mg | ORAL_TABLET | Freq: Four times a day (QID) | ORAL | Status: DC
  Administered 2019-03-15: 1000 mg via ORAL
  Filled 2019-03-15 (×2): qty 2

## 2019-03-15 MED ORDER — MIDAZOLAM HCL 2 MG/2ML IJ SOLN
INTRAMUSCULAR | Status: AC
  Filled 2019-03-15: qty 2

## 2019-03-15 MED ORDER — ONDANSETRON 4 MG PO TBDP: 4.0000 mg | ORAL_TABLET | Freq: Four times a day (QID) | ORAL | Status: DC | PRN

## 2019-03-15 MED ORDER — ONDANSETRON HCL 4 MG/2ML IJ SOLN
INTRAMUSCULAR | Status: AC
  Filled 2019-03-15: qty 2

## 2019-03-15 MED ORDER — METHYLENE BLUE 0.5 % INJ SOLN
INTRAVENOUS | Status: AC
  Filled 2019-03-15: qty 10

## 2019-03-15 MED ORDER — ENOXAPARIN SODIUM 30 MG/0.3ML ~~LOC~~ SOLN: 30.0000 mg | SUBCUTANEOUS | Status: DC

## 2019-03-15 MED ORDER — 0.9 % SODIUM CHLORIDE (POUR BTL) OPTIME
TOPICAL | Status: DC | PRN
  Administered 2019-03-15: 1000 mL

## 2019-03-15 MED ORDER — LATANOPROST 0.005 % OP SOLN
1.0000 [drp] | Freq: Every day | OPHTHALMIC | Status: DC
  Administered 2019-03-15: 1 [drp] via OPHTHALMIC
  Filled 2019-03-15: qty 2.5

## 2019-03-15 MED ORDER — STERILE WATER FOR IRRIGATION IR SOLN
Status: DC | PRN
  Administered 2019-03-15: 1000 mL

## 2019-03-15 MED ORDER — TRAMADOL HCL 50 MG PO TABS: 50.0000 mg | ORAL_TABLET | Freq: Four times a day (QID) | ORAL | Status: DC | PRN

## 2019-03-15 MED ORDER — CEFAZOLIN SODIUM-DEXTROSE 2-4 GM/100ML-% IV SOLN
2.0000 g | INTRAVENOUS | Status: AC
  Administered 2019-03-15: 2 g via INTRAVENOUS
  Filled 2019-03-15: qty 100

## 2019-03-15 MED ORDER — SODIUM CHLORIDE (PF) 0.9 % IJ SOLN
INTRAMUSCULAR | Status: AC
  Filled 2019-03-15: qty 10

## 2019-03-15 MED ORDER — HYDROMORPHONE HCL 1 MG/ML IJ SOLN: 0.5000 mg | INTRAMUSCULAR | Status: DC | PRN

## 2019-03-15 MED ORDER — EPHEDRINE SULFATE 50 MG/ML IJ SOLN
INTRAMUSCULAR | Status: DC | PRN
  Administered 2019-03-15 (×2): 5 mg via INTRAVENOUS

## 2019-03-15 MED ORDER — LACTATED RINGERS IV SOLN
INTRAVENOUS | Status: DC | PRN
  Administered 2019-03-15: 07:00:00 via INTRAVENOUS

## 2019-03-15 MED ORDER — DORZOLAMIDE HCL-TIMOLOL MAL 2-0.5 % OP SOLN
1.0000 [drp] | Freq: Two times a day (BID) | OPHTHALMIC | Status: DC
  Administered 2019-03-15 – 2019-03-16 (×3): 1 [drp] via OPHTHALMIC
  Filled 2019-03-15: qty 10

## 2019-03-15 MED ORDER — FENTANYL CITRATE (PF) 250 MCG/5ML IJ SOLN
INTRAMUSCULAR | Status: AC
  Filled 2019-03-15: qty 5

## 2019-03-15 MED ORDER — CHLORHEXIDINE GLUCONATE CLOTH 2 % EX PADS: 6.0000 | MEDICATED_PAD | Freq: Once | CUTANEOUS | Status: DC

## 2019-03-15 MED ORDER — GABAPENTIN 300 MG PO CAPS
300.0000 mg | ORAL_CAPSULE | Freq: Two times a day (BID) | ORAL | Status: DC
  Administered 2019-03-15 (×2): 300 mg via ORAL
  Filled 2019-03-15 (×2): qty 1

## 2019-03-15 MED ORDER — PROPOFOL 10 MG/ML IV BOLUS
INTRAVENOUS | Status: DC | PRN
  Administered 2019-03-15: 60 mg via INTRAVENOUS

## 2019-03-15 MED ORDER — BUPIVACAINE-EPINEPHRINE (PF) 0.5% -1:200000 IJ SOLN
INTRAMUSCULAR | Status: AC
  Filled 2019-03-15: qty 30

## 2019-03-15 MED ORDER — FENTANYL CITRATE (PF) 100 MCG/2ML IJ SOLN
INTRAMUSCULAR | Status: AC
  Filled 2019-03-15: qty 2

## 2019-03-15 MED ORDER — OXYCODONE HCL 5 MG PO TABS: 5.0000 mg | ORAL_TABLET | Freq: Once | ORAL | Status: DC | PRN

## 2019-03-15 MED ORDER — PHENYLEPHRINE 40 MCG/ML (10ML) SYRINGE FOR IV PUSH (FOR BLOOD PRESSURE SUPPORT)
PREFILLED_SYRINGE | INTRAVENOUS | Status: AC
  Filled 2019-03-15: qty 10

## 2019-03-15 MED ORDER — CEFAZOLIN SODIUM-DEXTROSE 2-4 GM/100ML-% IV SOLN
2.0000 g | Freq: Three times a day (TID) | INTRAVENOUS | Status: AC
  Administered 2019-03-15: 2 g via INTRAVENOUS
  Filled 2019-03-15: qty 100

## 2019-03-15 MED ORDER — BUPIVACAINE-EPINEPHRINE 0.25% -1:200000 IJ SOLN
INTRAMUSCULAR | Status: AC
  Filled 2019-03-15: qty 1

## 2019-03-15 MED ORDER — ONDANSETRON HCL 4 MG/2ML IJ SOLN
INTRAMUSCULAR | Status: DC | PRN
  Administered 2019-03-15: 4 mg via INTRAVENOUS

## 2019-03-15 MED ORDER — MIDAZOLAM HCL 2 MG/2ML IJ SOLN
INTRAMUSCULAR | Status: DC | PRN
  Administered 2019-03-15: 1 mg via INTRAVENOUS

## 2019-03-15 MED ORDER — BUPIVACAINE-EPINEPHRINE (PF) 0.5% -1:200000 IJ SOLN
INTRAMUSCULAR | Status: DC | PRN
  Administered 2019-03-15: 25 mL

## 2019-03-15 MED ORDER — FENTANYL CITRATE (PF) 250 MCG/5ML IJ SOLN
INTRAMUSCULAR | Status: DC | PRN
  Administered 2019-03-15 (×2): 25 ug via INTRAVENOUS
  Administered 2019-03-15: 50 ug via INTRAVENOUS
  Administered 2019-03-15: 25 ug via INTRAVENOUS

## 2019-03-15 MED ORDER — LACTATED RINGERS IV SOLN
INTRAVENOUS | Status: DC
  Administered 2019-03-15 – 2019-03-16 (×2): via INTRAVENOUS

## 2019-03-15 MED ORDER — OXYCODONE HCL 5 MG/5ML PO SOLN: 5.0000 mg | Freq: Once | ORAL | Status: DC | PRN

## 2019-03-15 MED ORDER — VITAMIN D 25 MCG (1000 UNIT) PO TABS
2000.0000 [IU] | ORAL_TABLET | Freq: Every day | ORAL | Status: DC
  Administered 2019-03-15: 13:00:00 2000 [IU] via ORAL
  Filled 2019-03-15 (×3): qty 2

## 2019-03-15 MED ORDER — LIDOCAINE 2% (20 MG/ML) 5 ML SYRINGE
INTRAMUSCULAR | Status: DC | PRN
  Administered 2019-03-15: 20 mg via INTRAVENOUS

## 2019-03-15 MED ORDER — ONDANSETRON HCL 4 MG/2ML IJ SOLN: 4.0000 mg | Freq: Once | INTRAMUSCULAR | Status: DC | PRN

## 2019-03-15 MED ORDER — GABAPENTIN 300 MG PO CAPS
300.0000 mg | ORAL_CAPSULE | ORAL | Status: AC
  Administered 2019-03-15: 300 mg via ORAL
  Filled 2019-03-15: qty 1

## 2019-03-15 MED ORDER — LIDOCAINE 2% (20 MG/ML) 5 ML SYRINGE
INTRAMUSCULAR | Status: AC
  Filled 2019-03-15: qty 5

## 2019-03-15 MED ORDER — ACETAMINOPHEN 500 MG PO TABS
1000.0000 mg | ORAL_TABLET | ORAL | Status: AC
  Administered 2019-03-15: 1000 mg via ORAL
  Filled 2019-03-15: qty 2

## 2019-03-15 MED ORDER — PROPOFOL 10 MG/ML IV BOLUS
INTRAVENOUS | Status: AC
  Filled 2019-03-15: qty 20

## 2019-03-15 SURGICAL SUPPLY — 48 items
ADH SKN CLS APL DERMABOND .7 (GAUZE/BANDAGES/DRESSINGS) ×1
APPLIER CLIP 9.375 MED OPEN (MISCELLANEOUS) ×3
APR CLP MED 9.3 20 MLT OPN (MISCELLANEOUS) ×1
BINDER BREAST LRG (GAUZE/BANDAGES/DRESSINGS) ×4 IMPLANT
BIOPATCH RED 1 DISK 7.0 (GAUZE/BANDAGES/DRESSINGS) ×2 IMPLANT
BIOPATCH RED 1IN DISK 7.0MM (GAUZE/BANDAGES/DRESSINGS) ×2
CANISTER SUCT 3000ML PPV (MISCELLANEOUS) ×3 IMPLANT
CLIP APPLIE 9.375 MED OPEN (MISCELLANEOUS) ×1 IMPLANT
CONT SPEC 4OZ CLIKSEAL STRL BL (MISCELLANEOUS) ×2 IMPLANT
COVER SURGICAL LIGHT HANDLE (MISCELLANEOUS) ×3 IMPLANT
COVER WAND RF STERILE (DRAPES) ×3 IMPLANT
DERMABOND ADVANCED (GAUZE/BANDAGES/DRESSINGS) ×2
DERMABOND ADVANCED .7 DNX12 (GAUZE/BANDAGES/DRESSINGS) ×1 IMPLANT
DRAIN CHANNEL 19F RND (DRAIN) ×6 IMPLANT
DRAPE LAPAROSCOPIC ABDOMINAL (DRAPES) ×3 IMPLANT
DRSG PAD ABDOMINAL 8X10 ST (GAUZE/BANDAGES/DRESSINGS) ×6 IMPLANT
DRSG TEGADERM 4X4.75 (GAUZE/BANDAGES/DRESSINGS) ×2 IMPLANT
ELECT BLADE 4.0 EZ CLEAN MEGAD (MISCELLANEOUS) ×3
ELECT REM PT RETURN 9FT ADLT (ELECTROSURGICAL) ×3
ELECTRODE BLDE 4.0 EZ CLN MEGD (MISCELLANEOUS) ×1 IMPLANT
ELECTRODE REM PT RTRN 9FT ADLT (ELECTROSURGICAL) ×1 IMPLANT
EVACUATOR SILICONE 100CC (DRAIN) ×6 IMPLANT
GAUZE SPONGE 4X4 12PLY STRL (GAUZE/BANDAGES/DRESSINGS) ×9 IMPLANT
GLOVE ECLIPSE 6.5 STRL STRAW (GLOVE) ×2 IMPLANT
GLOVE ECLIPSE 7.5 STRL STRAW (GLOVE) ×2 IMPLANT
GLOVE SS BIOGEL STRL SZ 7 (GLOVE) ×1 IMPLANT
GLOVE SUPERSENSE BIOGEL SZ 7 (GLOVE) ×2
GOWN STRL REUS W/ TWL LRG LVL3 (GOWN DISPOSABLE) ×2 IMPLANT
GOWN STRL REUS W/ TWL XL LVL3 (GOWN DISPOSABLE) ×1 IMPLANT
GOWN STRL REUS W/TWL LRG LVL3 (GOWN DISPOSABLE) ×9
GOWN STRL REUS W/TWL XL LVL3 (GOWN DISPOSABLE) ×3
KIT BASIN OR (CUSTOM PROCEDURE TRAY) ×3 IMPLANT
KIT TURNOVER KIT B (KITS) ×3 IMPLANT
NS IRRIG 1000ML POUR BTL (IV SOLUTION) ×3 IMPLANT
PACK GENERAL/GYN (CUSTOM PROCEDURE TRAY) ×3 IMPLANT
PAD ARMBOARD 7.5X6 YLW CONV (MISCELLANEOUS) ×3 IMPLANT
PENCIL SMOKE EVACUATOR (MISCELLANEOUS) ×3 IMPLANT
SPONGE LAP 18X18 RF (DISPOSABLE) ×2 IMPLANT
STAPLER VISISTAT 35W (STAPLE) ×2 IMPLANT
SUT ETHILON 3 0 FSL (SUTURE) ×10 IMPLANT
SUT MNCRL AB 4-0 PS2 18 (SUTURE) ×5 IMPLANT
SUT SILK 2 0 PERMA HAND 18 BK (SUTURE) ×3 IMPLANT
SUT VIC AB 3-0 SH 18 (SUTURE) ×3 IMPLANT
TOWEL GREEN STERILE (TOWEL DISPOSABLE) ×3 IMPLANT
TOWEL GREEN STERILE FF (TOWEL DISPOSABLE) ×3 IMPLANT
TUBE CONNECTING 12'X1/4 (SUCTIONS) ×1
TUBE CONNECTING 12X1/4 (SUCTIONS) ×1 IMPLANT
YANKAUER SUCT BULB TIP NO VENT (SUCTIONS) ×2 IMPLANT

## 2019-03-15 NOTE — Transfer of Care (Signed)
Immediate Anesthesia Transfer of Care Note  Patient: Krystal [name]  Procedure(s) Performed: LEFT TOTAL MASTECTOMY WITH REMOVAL OF INVOLVED LYMPH NODES (Left Breast)  Patient Location: PACU  Anesthesia Type:General and Regional  Level of Consciousness: sedated  Airway & Oxygen Therapy: Patient Spontanous Breathing and Patient connected to nasal cannula oxygen  Post-op Assessment: Report given to RN and Post -op Vital signs reviewed and stable  Post vital signs: Reviewed and stable  Last Vitals:  Vitals Value Taken Time  BP 149/64 03/15/19 0905  Temp    Pulse 54 03/15/19 0906  Resp 13 03/15/19 0906  SpO2 100 % 03/15/19 0906  Vitals shown include unvalidated device data.  Last Pain:  Vitals:   03/15/19 0626  TempSrc:   PainSc: 0-No pain      Patients Stated Pain Goal: 3 ([id number])  Complications: No apparent anesthesia complications

## 2019-03-15 NOTE — Op Note (Signed)
Patient Name:           Krystal [name]   Date of Surgery:        03/15/2019  Pre op Diagnosis:      Locally advanced cancer left breast, stage IIIb  Post op Diagnosis:    Same  Procedure:                 Left modified radical mastectomy  Surgeon:                     Edsel Petrin. Dalbert Batman, M.D., FACS  Assistant:                      RNFA   Indication for Assistant: Expedite case, provide exposure  Operative Indications:   . This is a very pleasant,  85 year old woman who lives at [place]. She is brought to the operating room for definitive surgery for her locally advanced cancer of the left breast with lymph node metastasis and skin invasion. her PCP is Hollace Kinnier, D.O., at [facility].  She has seen Dr. Lindi Adie.       She reports 2-3 months history of a lump in her left breast and eventually this became a little bit uncomfortable and she noted bleeding and draining. This was thought to be an abscess. She was seen in our urgent office and referred for imaging studies. Bilateral mammograms, and biopsies were performed. 5 cm mass in the central left breast and 2 abnormal lymph nodes. The breast and lymph nodes were biopsied and showed grade 2 invasive ductal carcinoma. Positive LVI. ER and PR negative. Ki-67 15%. HER-2 positive. She says the mammograms were very painful. Discussed in conference  My opinion is that she would benefit from palliative mastectomy  to palliate pain bleeding and drainage. Dr. Payton Mccallum agrees.       The patient has made it very clear that she does not want any interventions that would prolong her life. Certainly does not think she could tolerate daily trips for radiation therapy. She is very depressed about her blindness.      Comorbidities include macular degeneration with severe visual disturbances. She sees shapes and light. She can ambulate down the hall with a walker next to the handrail. Diabetes. Osteoporosis. History history left femur  fracture. Otherwise she is bright and alert intelligent with excellent insight Lives at [facility] in assisted living.      We had a long discussion. After multidisciplinary discussions she has agreed and wants to go ahead and schedule left mastectomy and left axillary lymph node dissection.    Operative Findings:       The tumor was large and had invaded through the skin with some bleeding.  It was probably 5 cm in size at least.  It was beginning to invade the lateral border of the pectoralis major muscle and the serratus anterior muscle and so I took the dissection deep to these muscles in the area of involvement.  There was no involvement of periosteum or rib cage.  She had some bulky adenopathy in the left axilla that was matted but mobile and I could dissect it completely free.  Basically I cleaned out her axilla from the axillary vein inferiorly taking level 1 and level 2 lymph nodes.  At the end of the axillary dissection the thoracodorsal nerve and the long thoracic nerve were identified and were functioning.  The skin was slightly tight but I could close  it primarily and added a few nylon sutures for security  Procedure in Detail:          Following the induction of general LMA anesthesia the patient's entire left wall and axilla were prepped and draped in a sterile fashion.  Intravenous antibiotics were given.  Surgical timeout was performed.  Using a marking pen I planned a transverse elliptical incision and uninvolved skin.  The incision was made with a knife.  I raised skin flaps superiorly to the infraclavicular area, laterally to the muscles, medially to the parasternal area and inferiorly to the inframammary crease.  I marked the lateral skin margin with a silk suture.  The breast was dissected off of the pectoralis major and minor muscles.  As mentioned above I had to take some of the lateral pectoralis major muscle and serratus anterior and went deep to get a clean dissection.  I  preserved the long thoracic nerve and the thoracodorsal nerve.  The specimen was removed.  I then carried the dissection up into the axilla.  I divided the clavipectoral fascia as high as possible.  I took the dissection up to the axillary vein and then pulled down the bulky lymph nodes that were involved.  Small vascular tributaries were controlled with metal clips and divided.  A couple of small sensory nerves were probably also sacrificed.  The axillary contents were sent as a separate specimen.  We checked to see that the nerves were functioning and they were.  We copiously irrigated the wound.  Hemostasis was excellent.  I placed 219 French Blake drains, one up into the axilla and one across the skin flaps.  These were brought out through separate stab incisions inferiorly sutured to the skin and connected to suction bulbs.  I closed the mastectomy wound in layers.  A few nylon sutures were placed in the skin for security.  The subcutaneous tissue was closed with 3-0 Vicryl sutures and the skin closed with a running subcuticular 4-0 Monocryl and Dermabond.  Dry bandages and a breast binder were placed.  The patient tolerated the procedure well and was taken to PACU in stable condition.  EBL 50 cc or less.  Counts correct.  Complications none.    Addendum: I logged onto the PMP aware website and reviewed her prescription medication history     Edelyn Heidel M. Dalbert Batman, M.D., FACS General and Minimally Invasive Surgery Breast and Colorectal Surgery  03/15/2019 8:40 AM

## 2019-03-15 NOTE — Interval H&P Note (Signed)
History and Physical Interval Note:  03/15/2019 5:35 AM  Krystal [name]  has presented today for surgery, with the diagnosis of LEFT BREAST CANCER.  The various methods of treatment have been discussed with the patient and family. After consideration of risks, benefits and other options for treatment, the patient has consented to  Procedure(s): LEFT TOTAL MASTECTOMY WITH REMOVAL OF INVOLVED LYMPH NODES (Left) as a surgical intervention.  The patient's history has been reviewed, patient examined, no change in status, stable for surgery.  I have reviewed the patient's chart and labs.  Questions were answered to the patient's satisfaction.     Adin Hector

## 2019-03-15 NOTE — NC FL2 (Signed)
[place] MEDICAID FL2 LEVEL OF CARE SCREENING TOOL     IDENTIFICATION  Patient Name: Krystal [name] Birthdate: 09/28/1931 Sex: female Admission Date (Current Location): 03/15/2019  [facility] [place] Number:  Herbalist and Address:  [facility] [place] [address] [place], [place] [address]      Provider Number: [id number]  Attending Physician Name and Address:  Fanny Skates, MD  Relative Name and Phone Number:  Ilona Sorrel, [phone]    Current Level of Care: Hospital Recommended Level of Care: [place] Prior Approval Number:    Date Approved/Denied:   PASRR Number: [id number]  Discharge Plan: SNF    Current Diagnoses: Patient Active Problem List   Diagnosis Date Noted  . Breast cancer, left breast ([place]) 03/15/2019  . Invasive ductal carcinoma of breast, female, left ([place]) 02/22/2019  . Metastatic disease ([place]) 02/22/2019  . Malignant neoplasm of lower-outer quadrant of left breast of female, estrogen receptor negative ([place]) 02/20/2019  . Blindness 05/19/2015  . Pertrochanteric fracture of left femur ([place]) 03/24/2015  . Hyponatremia 03/23/2015  . Hyperglycemia 03/23/2015  . Back pain   . Senile osteoporosis   . Compression fracture of lumbar vertebra ([facility])     Orientation RESPIRATION BLADDER Height & Weight     Self, Time, Situation, Place  Normal Continent Weight: 100 lb 12 oz (45.7 kg) Height:  5\' 10"  (177.8 cm)  BEHAVIORAL SYMPTOMS/MOOD NEUROLOGICAL BOWEL NUTRITION STATUS      Continent Diet(see discharge summary)  AMBULATORY STATUS COMMUNICATION OF NEEDS Skin   Extensive Assist Verbally Surgical wounds, Other (Comment)(closed incision on breast with ABD and liquid skin adhesive; 2 jp drains)                       Personal Care Assistance Level of Assistance  Dressing, Feeding, Bathing Bathing Assistance: Maximum assistance Feeding assistance: Limited assistance Dressing  Assistance: Maximum assistance     Functional Limitations Info  Sight, Hearing, Speech Sight Info: Impaired(pt is blind in both eyes) Hearing Info: Adequate Speech Info: Adequate    SPECIAL CARE FACTORS FREQUENCY                       Contractures Contractures Info: Not present    Additional Factors Info  Code Status, Allergies Code Status Info: DNR Allergies Info: No Known Allergies           Current Medications (03/15/2019):  This is the current hospital active medication list Current Facility-Administered Medications  Medication Dose Route Frequency Provider Last Rate Last Dose  . acetaminophen (TYLENOL) tablet 1,000 mg  1,000 mg Oral Q6H Fanny Skates, MD   1,000 mg at 03/15/19 1313  . cholecalciferol (VITAMIN D) tablet 2,000 Units  2,000 Units Oral Daily Fanny Skates, MD   2,000 Units at 03/15/19 1313  . dorzolamide-timolol (COSOPT) 22.3-6.8 MG/ML ophthalmic solution 1 drop  1 drop Right Eye BID Fanny Skates, MD   1 drop at 03/15/19 1320  . [START ON 03/16/2019] enoxaparin (LOVENOX) injection 30 mg  30 mg Subcutaneous Q24H Fanny Skates, MD      . gabapentin (NEURONTIN) capsule 300 mg  300 mg Oral BID Fanny Skates, MD   300 mg at 03/15/19 1314  . HYDROcodone-acetaminophen (NORCO/VICODIN) 5-325 MG per tablet 1-2 tablet  1-2 tablet Oral Q4H PRN Fanny Skates, MD      . HYDROmorphone (DILAUDID) injection 0.5 mg  0.5 mg Intravenous Q2H PRN Fanny Skates,  MD      . lactated ringers infusion   Intravenous Continuous Fanny Skates, MD 50 mL/hr at 03/15/19 1314    . latanoprost (XALATAN) 0.005 % ophthalmic solution 1 drop  1 drop Right Eye QHS Fanny Skates, MD      . ondansetron (ZOFRAN-ODT) disintegrating tablet 4 mg  4 mg Oral Q6H PRN Fanny Skates, MD       Or  . ondansetron [facility]) injection 4 mg  4 mg Intravenous Q6H PRN Fanny Skates, MD      . senna [facility]) tablet 8.6 mg  1 tablet Oral BID Fanny Skates, MD   8.6 mg at 03/15/19 1313  .  traMADol (ULTRAM) tablet 50 mg  50 mg Oral Q6H PRN Fanny Skates, MD         Discharge Medications: Please see discharge summary for a list of discharge medications.  Relevant Imaging Results:  Relevant Lab Results:   Additional Information [id number] [place], [place]

## 2019-03-15 NOTE — Anesthesia Procedure Notes (Signed)
Procedure Name: LMA Insertion Date/Time: 03/15/2019 7:32 AM Performed by: Clearnce Sorrel, CRNA Pre-anesthesia Checklist: Patient identified, Emergency Drugs available, Suction available, Patient being monitored and Timeout performed Patient Re-evaluated:Patient Re-evaluated prior to induction Oxygen Delivery Method: Circle system utilized Preoxygenation: Pre-oxygenation with 100% oxygen Induction Type: IV induction LMA: LMA inserted LMA Size: 3.0 Number of attempts: 1 Placement Confirmation: positive ETCO2 and breath sounds checked- equal and bilateral Tube secured with: Tape Dental Injury: Teeth and Oropharynx as per pre-operative assessment

## 2019-03-15 NOTE — TOC Initial Note (Addendum)
Transition of Care [facility]) - Initial/Assessment Note    Patient Details  Name: SONG STEADY MRN: [id number] Date of Birth: 07-10-1931  [facility]) CM/SW Contact:    Marilu Favre, RN Phone Number: 03/15/2019, 2:53 PM  Clinical Narrative:                 Patient from [facility]. Discussed with patient if she wanted to go back to assisted living or SNF care at [facility]. Patient would prefer to go to SNF level of care at least for a night or two at discharge.  Michiel Cowboy SW has completed FL2 and spoke with Butch Penny at [facility] to Dr Dalbert Batman regarding needing new covid test and hard copies of prescriptions for any controlled substance even if it is a home medication.  Patient aware new covid test needed.  Expected Discharge Plan: [place]     Patient Goals and CMS Choice Patient states their goals for this hospitalization and ongoing recovery are:: to go to SNF      Expected Discharge Plan and Services Expected Discharge Plan: [place]   Discharge Planning Services: CM Consult   Living arrangements for the past 2 months: [place]                 DME Arranged: N/A         HH Arranged: NA          Prior Living Arrangements/Services Living arrangements for the past 2 months: [place] Lives with:: Self Patient language and need for interpreter reviewed:: Yes        Need for Family Participation in Patient Care: Yes (Comment) Care giver support system in place?: No (comment)   Criminal Activity/Legal Involvement Pertinent to Current Situation/Hospitalization: No - Comment as needed  Activities of Daily Living   ADL Screening (condition at time of admission) Patient's cognitive ability adequate to safely complete daily activities?: Yes Patient able to express need for assistance with ADLs?: Yes Independently performs ADLs?: Yes (appropriate for developmental age)  Permission  Sought/Granted   Permission granted to share information with : Yes, Verbal Permission Granted     Permission granted to share info w AGENCY: [facility]        Emotional Assessment Appearance:: Appears stated age Attitude/Demeanor/Rapport: Engaged Affect (typically observed): Accepting Orientation: : Oriented to Self, Oriented to Place, Oriented to  Time, Oriented to Situation Alcohol / Substance Use: Not Applicable Psych Involvement: No (comment)  Admission diagnosis:  LEFT BREAST CANCER Patient Active Problem List   Diagnosis Date Noted  . Breast cancer, left breast ([place]) 03/15/2019  . Invasive ductal carcinoma of breast, female, left ([place]) 02/22/2019  . Metastatic disease ([place]) 02/22/2019  . Malignant neoplasm of lower-outer quadrant of left breast of female, estrogen receptor negative ([place]) 02/20/2019  . Blindness 05/19/2015  . Pertrochanteric fracture of left femur ([place]) 03/24/2015  . Hyponatremia 03/23/2015  . Hyperglycemia 03/23/2015  . Back pain   . Senile osteoporosis   . Compression fracture of lumbar vertebra ([place])    PCP:  Gayland Curry, DO Pharmacy:   [facility], [address] [place]  [address] Phone: [phone] Fax: [phone]     Social Determinants of Health (SDOH) Interventions    Readmission Risk Interventions No flowsheet data found.

## 2019-03-15 NOTE — Discharge Instructions (Signed)
[facility]___[facility] [place] °[phone] ° °MASTECTOMY: POST OP INSTRUCTIONS ° °Always review your discharge instruction sheet given to you by the facility where your surgery was performed. °IF YOU HAVE DISABILITY OR FAMILY LEAVE FORMS, YOU MUST BRING THEM TO THE OFFICE FOR PROCESSING.   °DO NOT GIVE THEM TO YOUR DOCTOR. °A prescription for pain medication may be given to you upon discharge.  Take your pain medication as prescribed, if needed.  If narcotic pain medicine is not needed, then you may take acetaminophen (Tylenol) or ibuprofen (Advil) as needed. °1. Take your usually prescribed medications unless otherwise directed. °2. If you need a refill on your pain medication, please contact your pharmacy.  They will contact our office to request authorization.  Prescriptions will not be filled after 5pm or on week-ends. °3. You should follow a light diet the first few days after arrival home, such as soup and crackers, etc.  Resume your normal diet the day after surgery. °4. Most patients will experience some swelling and bruising on the chest and underarm.  Ice packs will help.  Swelling and bruising can take several days to resolve.  °5. It is common to experience some constipation if taking pain medication after surgery.  Increasing fluid intake and taking a stool softener (such as Colace) will usually help or prevent this problem from occurring.  A mild laxative (Milk of Magnesia or Miralax) should be taken according to package instructions if there are no bowel movements after 48 hours. °6. Unless discharge instructions indicate otherwise, leave your bandage dry and in place until your next appointment in 3-5 days.  You may take a limited sponge bath.  No tube baths or showers until the drains are removed.  You may have steri-strips (small skin tapes) in place directly over the incision.  These strips should be left on the skin for 7-10 days.  If your surgeon used skin glue on the incision, you may  shower in 24 hours.  The glue will flake off over the next 2-3 weeks.  Any sutures or staples will be removed at the office during your follow-up visit. °7. DRAINS:  If you have drains in place, it is important to keep a list of the amount of drainage produced each day in your drains.  Before leaving the hospital, you should be instructed on drain care.  Call our office if you have any questions about your drains. °8. ACTIVITIES:  You may resume regular (light) daily activities beginning the next day--such as daily self-care, walking, climbing stairs--gradually increasing activities as tolerated.  You may have sexual intercourse when it is comfortable.  Refrain from any heavy lifting or straining until approved by your doctor. °a. You may drive when you are no longer taking prescription pain medication, you can comfortably wear a seatbelt, and you can safely maneuver your car and apply brakes. °b. RETURN TO WORK:  __________________________________________________________ °9. You should see your doctor in the office for a follow-up appointment approximately 3-5 days after your surgery.  Your doctor’s nurse will typically make your follow-up appointment when she calls you with your pathology report.  Expect your pathology report 2-3 business days after your surgery.  You may call to check if you do not hear from us after three days.   °10. OTHER INSTRUCTIONS: ______________________________________________________________________________________________ ____________________________________________________________________________________________ °WHEN TO CALL YOUR DOCTOR: °1. Fever over 101.0 °2. Nausea and/or vomiting °3. Extreme swelling or bruising °4. Continued bleeding from incision. °5. Increased pain, redness, or drainage from the incision. °  The clinic staff is available to answer your questions during regular business hours.  Please dont hesitate to call and ask to speak to one of the nurses for clinical  concerns.  If you have a medical emergency, go to the nearest emergency room or call 911.  A surgeon from [facility] is always on call at the hospital. [address], [place], [place], [place]  [address] ? [address], [place], [place]   [address] [phone] ? [phone] ? FAX [phone] Web site: [email or web address]           Managing Your Pain After Surgery Without Opioids    Thank you for participating in our program to help patients manage their pain after surgery without opioids. This is part of our effort to provide you with the best care possible, without exposing you or your family to the risk that opioids pose.  What pain can I expect after surgery? You can expect to have some pain after surgery. This is normal. The pain is typically worse the day after surgery, and quickly begins to get better. Many studies have found that many patients are able to manage their pain after surgery with Over-the-Counter (OTC) medications such as Tylenol and Motrin. If you have a condition that does not allow you to take Tylenol or Motrin, notify your surgical team.  How will I manage my pain? The best strategy for controlling your pain after surgery is around the clock pain control with Tylenol (acetaminophen) and Motrin (ibuprofen or Advil). Alternating these medications with each other allows you to maximize your pain control. In addition to Tylenol and Motrin, you can use heating pads or ice packs on your incisions to help reduce your pain.  How will I alternate your regular strength over-the-counter pain medication? You will take a dose of pain medication every three hours. ; Start by taking 650 mg of Tylenol (2 pills of 325 mg) ; 3 hours later take 600 mg of Motrin (3 pills of 200 mg) ; 3 hours after taking the Motrin take 650 mg of Tylenol ; 3 hours after that take 600 mg of Motrin.   - 1 -  See example - if your first dose of Tylenol is at 12:00  PM   12:00 PM Tylenol 650 mg (2 pills of 325 mg)  3:00 PM Motrin 600 mg (3 pills of 200 mg)  6:00 PM Tylenol 650 mg (2 pills of 325 mg)  9:00 PM Motrin 600 mg (3 pills of 200 mg)  Continue alternating every 3 hours   We recommend that you follow this schedule around-the-clock for at least 3 days after surgery, or until you feel that it is no longer needed. Use the table on the last page of this handout to keep track of the medications you are taking. Important: Do not take more than 3000mg  of Tylenol or 3200mg  of Motrin in a 24-hour period. Do not take ibuprofen/Motrin if you have a history of bleeding stomach ulcers, severe kidney disease, &/or actively taking a blood thinner  What if I still have pain? If you have pain that is not controlled with the over-the-counter pain medications (Tylenol and Motrin or Advil) you might have what we call breakthrough pain. You will receive a prescription for a small amount of an opioid pain medication such as Oxycodone, Tramadol, or Tylenol with Codeine. Use these opioid pills in the first 24 hours after surgery if you have breakthrough pain. Do not take more  than 1 pill every 4-6 hours.  If you still have uncontrolled pain after using all opioid pills, don't hesitate to call our staff using the number provided. We will help make sure you are managing your pain in the best way possible, and if necessary, we can provide a prescription for additional pain medication.   Day 1    Time  Name of Medication Number of pills taken  Amount of Acetaminophen  Pain Level   Comments  AM PM       AM PM       AM PM       AM PM       AM PM       AM PM       AM PM       AM PM       Total Daily amount of Acetaminophen Do not take more than  3,000 mg per day      Day 2    Time  Name of Medication Number of pills taken  Amount of Acetaminophen  Pain Level   Comments  AM PM       AM PM       AM PM       AM PM       AM PM       AM PM       AM  PM       AM PM       Total Daily amount of Acetaminophen Do not take more than  3,000 mg per day      Day 3    Time  Name of Medication Number of pills taken  Amount of Acetaminophen  Pain Level   Comments  AM PM       AM PM       AM PM       AM PM          AM PM       AM PM       AM PM       AM PM       Total Daily amount of Acetaminophen Do not take more than  3,000 mg per day      Day 4    Time  Name of Medication Number of pills taken  Amount of Acetaminophen  Pain Level   Comments  AM PM       AM PM       AM PM       AM PM       AM PM       AM PM       AM PM       AM PM       Total Daily amount of Acetaminophen Do not take more than  3,000 mg per day      Day 5    Time  Name of Medication Number of pills taken  Amount of Acetaminophen  Pain Level   Comments  AM PM       AM PM       AM PM       AM PM       AM PM       AM PM       AM PM       AM PM       Total Daily amount of Acetaminophen Do not take more than  3,000 mg per  day       Day 6    Time  Name of Medication Number of pills taken  Amount of Acetaminophen  Pain Level  Comments  AM PM       AM PM       AM PM       AM PM       AM PM       AM PM       AM PM       AM PM       Total Daily amount of Acetaminophen Do not take more than  3,000 mg per day      Day 7    Time  Name of Medication Number of pills taken  Amount of Acetaminophen  Pain Level   Comments  AM PM       AM PM       AM PM       AM PM       AM PM       AM PM       AM PM       AM PM       Total Daily amount of Acetaminophen Do not take more than  3,000 mg per day        For additional information about how and where to safely dispose of unused opioid medications - [employer].com.br  Disclaimer: This document contains information and/or instructional materials adapted from [facility] for the typical patient with your condition. It does not replace medical advice  from your health care provider because your experience may differ from that of the typical patient. Talk to your health care provider if you have any questions about this document, your condition or your treatment plan. Adapted from [place]

## 2019-03-15 NOTE — Progress Notes (Signed)
Received Pt from pacu, AOx4, VSS, no reports of pain at this time. Oriented to room, call bell and phone within patient's reach. Will continue to monitor.

## 2019-03-15 NOTE — Anesthesia Procedure Notes (Signed)
Anesthesia Regional Block: Pectoralis block   Pre-Anesthetic Checklist: ,, timeout performed, Correct Patient, Correct Site, Correct Laterality, Correct Procedure, Correct Position, site marked, Risks and benefits discussed,  Surgical consent,  Pre-op evaluation,  At surgeon's request and post-op pain management  Laterality: Left  Prep: chloraprep       Needles:  Injection technique: Single-shot  Needle Type: Echogenic Needle     Needle Length: 10cm  Needle Gauge: 21     Additional Needles:   Narrative:  Start time: 03/15/2019 7:10 AM End time: 03/15/2019 7:14 AM Injection made incrementally with aspirations every 5 mL.  Performed by: Personally  Anesthesiologist: [name], MD  Additional Notes: No pain on injection. No increased resistance to injection. Injection made in 5cc increments. Good needle visualization. Patient tolerated the procedure well.

## 2019-03-15 NOTE — TOC Progression Note (Signed)
Transition of Care [facility]) - Progression Note    Patient Details  Name: Krystal [name] MRN: [id number] Date of Birth: December 02, 1931  Transition of Care [facility]) CM/SW [place], [place] Phone Number: 03/15/2019, 3:31 PM  Clinical Narrative:    Pt accepted for SNF side of [facility]; needs new COVID screen and any prescriptions for controlled medications signed and printed. This has been paged to attending MD.  When she is ready for d/c she will go to Room 101; RN# [phone] or [phone]   Expected Discharge Plan: [place]    Expected Discharge Plan and Services Expected Discharge Plan: [place]   Discharge Planning Services: CM Consult   Living arrangements for the past 2 months: Assisted Living Facility                 DME Arranged: N/A         HH Arranged: NA           Social Determinants of Health (SDOH) Interventions    Readmission Risk Interventions No flowsheet data found.

## 2019-03-16 DIAGNOSIS — Z171 Estrogen receptor negative status [ER-]: Secondary | ICD-10-CM | POA: Diagnosis not present

## 2019-03-16 DIAGNOSIS — Z743 Need for continuous supervision: Secondary | ICD-10-CM | POA: Diagnosis not present

## 2019-03-16 DIAGNOSIS — C773 Secondary and unspecified malignant neoplasm of axilla and upper limb lymph nodes: Secondary | ICD-10-CM | POA: Diagnosis not present

## 2019-03-16 DIAGNOSIS — Z20828 Contact with and (suspected) exposure to other viral communicable diseases: Secondary | ICD-10-CM | POA: Diagnosis not present

## 2019-03-16 DIAGNOSIS — H3530 Unspecified macular degeneration: Secondary | ICD-10-CM | POA: Diagnosis not present

## 2019-03-16 DIAGNOSIS — R51 Headache: Secondary | ICD-10-CM | POA: Diagnosis not present

## 2019-03-16 DIAGNOSIS — R279 Unspecified lack of coordination: Secondary | ICD-10-CM | POA: Diagnosis not present

## 2019-03-16 DIAGNOSIS — C50512 Malignant neoplasm of lower-outer quadrant of left female breast: Secondary | ICD-10-CM | POA: Diagnosis not present

## 2019-03-16 DIAGNOSIS — H548 Legal blindness, as defined in USA: Secondary | ICD-10-CM | POA: Diagnosis not present

## 2019-03-16 DIAGNOSIS — R5381 Other malaise: Secondary | ICD-10-CM | POA: Diagnosis not present

## 2019-03-16 LAB — NOVEL CORONAVIRUS, NAA (HOSP ORDER, SEND-OUT TO REF LAB; TAT 18-24 HRS): SARS-CoV-2, NAA: NOT DETECTED

## 2019-03-16 NOTE — Progress Notes (Signed)
Patient will go to room 101 at [facility] and will be transported via [facility] at 3pm. The number to call for report is [phone]. Please ensure that all the patient's controlled medication prescriptions are printed and signed, along with her DNR form. CSW spoke with patient's RN Charleston Ropes to inform her of plan.   CSW faxed patient's discharge summary to facility.  Madilyn Fireman, MSW, LCSW-A Clinical Social Worker   Transitions of [facility]   Medical ICU [phone]

## 2019-03-16 NOTE — Progress Notes (Signed)
Report given to [place], Therapist, sports at [facility].

## 2019-03-16 NOTE — Progress Notes (Signed)
Discharged pt to [facility] via [facility], VSS, no reports of pain, report given to receiving RN at the Facility.

## 2019-03-16 NOTE — Discharge Summary (Signed)
Physician Discharge Summary  Patient ID:  Krystal [name]  MRN: [id number]  DOB/AGE: 1931/09/19 86 y.o.  Admit date: 03/15/2019 Discharge date: 03/16/2019  Discharge Diagnoses:   Principal Problem:   Malignant neoplasm of lower-outer quadrant of left breast of female, estrogen receptor negative ([place]) Active Problems:   Blindness   Metastatic disease ([place])   Breast cancer, left breast ([place])   Operation: Procedure(s): LEFT TOTAL MASTECTOMY WITH REMOVAL OF INVOLVED LYMPH NODES on 03/15/2019 Dalbert Batman  Discharged Condition: good  Hospital Course: Krystal [name] is an 85 y.o. female whose primary care physician is Gayland Curry, DO and who was admitted 03/15/2019 with a chief complaint of left breast cancer.   She was brought to the operating room on 03/15/2019 and underwent  LEFT TOTAL MASTECTOMY WITH REMOVAL OF INVOLVED LYMPH NODES.   She is now one day post op.  She has done well.  She is having no pain.  She is to return to [facility]. She is awaiting retesting of her Covid. She is frustrated by her blindness and she talked about this.  The discharge instructions were reviewed with the patient.  Consults: None  Significant Diagnostic Studies: Results for orders placed or performed during the hospital encounter of 03/15/19  CBC WITH DIFFERENTIAL  Result Value Ref Range   WBC 5.9 4.0 - 10.5 K/uL   RBC 4.03 3.87 - 5.11 MIL/uL   Hemoglobin 13.1 12.0 - 15.0 g/dL   HCT 38.7 36.0 - 46.0 %   MCV 96.0 80.0 - 100.0 fL   MCH 32.5 26.0 - 34.0 pg   MCHC 33.9 30.0 - 36.0 g/dL   RDW 13.7 11.5 - 15.5 %   Platelets 215 150 - 400 K/uL   nRBC 0.0 0.0 - 0.2 %   Neutrophils Relative % 65 %   Neutro Abs 3.9 1.7 - 7.7 K/uL   Lymphocytes Relative 21 %   Lymphs Abs 1.2 0.7 - 4.0 K/uL   Monocytes Relative 8 %   Monocytes Absolute 0.5 0.1 - 1.0 K/uL   Eosinophils Relative 4 %   Eosinophils Absolute 0.2 0.0 - 0.5 K/uL   Basophils Relative 1 %   Basophils Absolute 0.1 0.0 - 0.1 K/uL   Immature  Granulocytes 1 %   Abs Immature Granulocytes 0.03 0.00 - 0.07 K/uL  Comprehensive metabolic panel  Result Value Ref Range   Sodium 133 (L) 135 - 145 mmol/L   Potassium 3.4 (L) 3.5 - 5.1 mmol/L   Chloride 96 (L) 98 - 111 mmol/L   CO2 28 22 - 32 mmol/L   Glucose, Bld 101 (H) 70 - 99 mg/dL   BUN 12 8 - 23 mg/dL   Creatinine, Ser 0.52 0.44 - 1.00 mg/dL   Calcium 9.1 8.9 - 10.3 mg/dL   Total Protein 7.1 6.5 - 8.1 g/dL   Albumin 3.6 3.5 - 5.0 g/dL   AST 19 15 - 41 U/L   ALT 12 0 - 44 U/L   Alkaline Phosphatase 70 38 - 126 U/L   Total Bilirubin 0.9 0.3 - 1.2 mg/dL   GFR calc non Af Amer >60 >60 mL/min   GFR calc Af Amer >60 >60 mL/min   Anion gap 9 5 - 15    No results found.  Discharge Exam:  Vitals:   03/15/19 2118 03/16/19 0552  BP: (!) 102/52 (!) 110/53  Pulse: (!) 59 (!) 57  Resp: 17 16  Temp: (!) 97.5 F (36.4 C) (!) 97.5 F (36.4 C)  SpO2: 100% 97%    General: WN thin older WF who is alert and generally healthy appearing.  She is wearing a mask Lungs: Clear to auscultation and symmetric breath sounds. Heart:  RRR. No murmur or rub. Chest:  Left breast wound looks good.   Drain 1/2 - 80/55 cc last 24 hours  Discharge Medications:   Allergies as of 03/16/2019   No Known Allergies     Medication List    TAKE these medications   acetaminophen 500 MG tablet Commonly known as: TYLENOL Take 1,000 mg by mouth every 6 (six) hours as needed. For pain.   cholecalciferol 1000 units tablet Commonly known as: VITAMIN D Take 2,000 Units by mouth daily.   dorzolamide-timolol 22.3-6.8 MG/ML ophthalmic solution Commonly known as: COSOPT Place 1 drop into the right eye 2 (two) times daily.   multivitamin with minerals Tabs tablet Take 1 tablet by mouth daily.   omega-3 acid ethyl esters 1 g capsule Commonly known as: LOVAZA Take 1 g by mouth daily.   Travatan Z 0.004 % Soln ophthalmic solution Generic drug: Travoprost (BAK Free) Place 1 drop into the right eye at  bedtime.       Disposition: Discharge disposition: 01-Home or Self Care       Discharge Instructions    Diet - low sodium heart healthy   Complete by: As directed    Increase activity slowly   Complete by: As directed        Contact information for follow-up providers    Fanny Skates, MD. Schedule an appointment as soon as possible for a visit in 2 week(s).   Specialty: General Surgery Contact information: [place] [address] [phone]            Contact information for after-discharge care    Destination    [facility] .   Service: Skilled Nursing Contact information: [address] [place] [phone]                   Signed: Alphonsa Overall, M.D., [facility] Office:  [phone]  03/16/2019, 8:50 AM

## 2019-03-18 ENCOUNTER — Encounter (HOSPITAL_COMMUNITY): Payer: Self-pay | Admitting: General Surgery

## 2019-03-18 LAB — SURGICAL PATHOLOGY

## 2019-03-18 NOTE — Anesthesia Postprocedure Evaluation (Signed)
Anesthesia Post Note  Patient: Krystal [name]  Procedure(s) Performed: LEFT TOTAL MASTECTOMY WITH REMOVAL OF INVOLVED LYMPH NODES (Left Breast)     Patient location during evaluation: PACU Anesthesia Type: General Level of consciousness: awake and alert Pain management: pain level controlled Vital Signs Assessment: post-procedure vital signs reviewed and stable Respiratory status: spontaneous breathing, nonlabored ventilation and respiratory function stable Cardiovascular status: blood pressure returned to baseline and stable Postop Assessment: no apparent nausea or vomiting Anesthetic complications: no    Last Vitals:  Vitals:   03/16/19 1155 03/16/19 1526  BP: 129/60 (!) 119/55  Pulse: 63 66  Resp: 18 18  Temp: 36.9 C 36.9 C  SpO2: 100% 100%    Last Pain:  Vitals:   03/16/19 1526  TempSrc: Oral  PainSc:                  [name]

## 2019-03-19 ENCOUNTER — Encounter: Payer: Self-pay | Admitting: *Deleted

## 2019-03-19 ENCOUNTER — Non-Acute Institutional Stay: Payer: Medicare Other

## 2019-03-19 DIAGNOSIS — R739 Hyperglycemia, unspecified: Secondary | ICD-10-CM | POA: Diagnosis not present

## 2019-03-19 DIAGNOSIS — C799 Secondary malignant neoplasm of unspecified site: Secondary | ICD-10-CM

## 2019-03-19 DIAGNOSIS — H548 Legal blindness, as defined in USA: Secondary | ICD-10-CM | POA: Diagnosis not present

## 2019-03-19 DIAGNOSIS — C50912 Malignant neoplasm of unspecified site of left female breast: Secondary | ICD-10-CM | POA: Diagnosis not present

## 2019-03-19 DIAGNOSIS — M810 Age-related osteoporosis without current pathological fracture: Secondary | ICD-10-CM

## 2019-03-19 NOTE — Progress Notes (Signed)
Inform patient of Pathology report,. As expected this was a large, 6 cm. Tumor. Some of the lymph nodes had cancer, also as expected. It seems to have been completely resected., and no further surgery will be necessary Let me know that you contacted her. Thanks.  Dalbert Batman

## 2019-03-19 NOTE — Progress Notes (Signed)
Patient ID: Krystal [name], female   DOB: [birth year], 85 y.o.   MRN: [id number]      Provider: Gayland Curry, DO  Location:  [facility] Room Number: 629-B Place of Service:  ALF ([phone])  PCP: Gayland Curry, DO Patient Care Team: Gayland Curry, DO as PCP - General (Geriatric Medicine) Gerarda Fraction, MD as Referring Physician (Ophthalmology)  Extended Emergency Contact Information Primary Emergency Contact: Andrez Grime, [place] [address] Johnnette Litter of [place] Phone: [phone] Relation: Neighbor  Code Status: DNR, MOST Goals of Care: Advanced Directive information Advanced Directives 03/19/2019  Does Patient Have a Medical Advance Directive? Yes  Type of Advance Directive Out of facility DNR (pink MOST or yellow form)  Does patient want to make changes to medical advance directive? No - Patient declined  Copy of [place] in Chart? -  Pre-existing out of facility DNR order (yellow form or pink MOST form) Pink MOST form placed in chart (order not valid for inpatient use);Yellow form placed in chart (order not valid for inpatient use)   Chief Complaint  Patient presents with  . Readmit To SNF    Readmission to [facility] s/p left radical mastectomy with lymph node dissection     HPI: Patient is a 85 y.o. female with h/o blindness, senile osteoporosis with left hip fx and lumbar compression fx, hyperglycemia, and most recently left breast cancer, ER neg, PR neg, HER2+ (cT4b, cN1, cM0, G2) seen today for readmission to her AL apt s/p hospitalization for left total mastectomy with removal of involved lymph nodes by Dr. Dalbert Batman. She tolerated her surgery well.  She has been very clear that she does not want any additional treatment for her cancer including radiation and chemo even if it was recommended for her.  She opted for surgery to allow for more comfort as her breast was painful when touched or moved and was  bleeding and draining considerably.  She had a second covid test before discharge that was negative on 03/15/19.  She currently has two JP drains in place.  Day of discharge she had 80cc and 55cc respectively draining in a 24 hr period.  Discharge note does not indicate any pain medications prescribed outside of tylenol.    When seen, she was doing very well.  Krystal [name] was not having pain, just a bit of soreness where the drains come out and if she attempts to lift her left arm.  She's not required any pain medication after having the nerve block for the procedure.  She has had 10-20cc of drainage from each JP drain each of two shifts (she had refused interruption in the middle of the night for this so she can rest).  She is a bit bothered by the corset she's wearing to keep all of the dressings and drains in place.  There was one episode where he pant elastic apparently moved up and got bloody at the drain site.  She admits to some fatigue and malaise, but otherwise is doing quite well.    She is to f/u with Dr. Dalbert Batman in 2 weeks (scheduled 9/30 at 2:45pm).  She sees me again 05/01/19 in clinic here at [facility] and has routine labs before that so we can ensure her h/h is trending up.  Past Medical History:  Diagnosis Date  . Anxiety   . Arthritis   . Back pain   . Borderline diabetes   .  Compression fracture of lumbar vertebra ([place]) 2013  . Constipation   . Displaced intertrochanteric fracture of left femur ([place])   . Fracture of unspecified part of neck of unspecified femur, initial encounter for closed fracture ([place])   . GERD (gastroesophageal reflux disease)   . Hyperglycemia   . Hypo-osmolality and hyponatremia   . Macular degeneration    both eyes, blind  . Osteoporosis    Past Surgical History:  Procedure Laterality Date  . CATARACT EXTRACTION    . HIP FRACTURE SURGERY Left 2016  . INTRAMEDULLARY (IM) NAIL INTERTROCHANTERIC Left 03/24/2015   Procedure: INTRAMEDULLARY (IM) NAIL  INTERTROCHANTRIC;  Surgeon: Rod Can, MD;  Location: [facility];  Service: Orthopedics;  Laterality: Left;  Marland Kitchen MASTECTOMY WITH AXILLARY LYMPH NODE DISSECTION Left 03/15/2019   Procedure: LEFT TOTAL MASTECTOMY WITH REMOVAL OF INVOLVED LYMPH NODES;  Surgeon: Fanny Skates, MD;  Location: [facility];  Service: General;  Laterality: Left;  . TONSILLECTOMY      reports that she quit smoking about 53 years ago. She has never used smokeless tobacco. She reports that she does not drink alcohol or use drugs. Social History   Socioeconomic History  . Marital status: Single    Spouse name: Not on file  . Number of children: Not on file  . Years of education: Not on file  . Highest education level: Not on file  Occupational History  . Occupation: retired  Scientific laboratory technician  . Financial resource strain: Not hard at all  . Food insecurity    Worry: Never true    Inability: Never true  . Transportation needs    Medical: No    Non-medical: No  Tobacco Use  . Smoking status: Former Smoker    Quit date: 06/27/1965    Years since quitting: 53.7  . Smokeless tobacco: Never Used  Substance and Sexual Activity  . Alcohol use: Never    Frequency: Never    Comment: wine occasional  . Drug use: No  . Sexual activity: Never  Lifestyle  . Physical activity    Days per week: 7 days    Minutes per session: 60 min  . Stress: To some extent  Relationships  . Social connections    Talks on phone: More than three times a week    Gets together: More than three times a week    Attends religious service: Never    Active member of club or organization: No    Attends meetings of clubs or organizations: Never    Relationship status: Never married  . Intimate partner violence    Fear of current or ex partner: No    Emotionally abused: No    Physically abused: No    Forced sexual activity: No  Other Topics Concern  . 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 Cancer Mother        breast  . Breast cancer Mother 69  . Heart disease Father     Health Maintenance  Topic Date Due  .  TETANUS/TDAP  12/16/1952  . INFLUENZA VACCINE  01/26/2019  . DEXA SCAN  10/25/2023 (Originally 12/17/1998)  . PNA vac Low Risk Adult  Completed    No Known Allergies  Outpatient Encounter Medications as of 03/19/2019  Medication Sig  . acetaminophen (TYLENOL) 500 MG tablet Take 1,000 mg by mouth every 6 (six) hours as needed. For pain.  . cholecalciferol (VITAMIN D) 1000 UNITS tablet Take 2,000 Units by mouth daily.  . dorzolamide-timolol (COSOPT) 22.3-6.8 MG/ML ophthalmic solution Place 1 drop into the right eye 2 (two) times daily.  . Multiple Vitamin (MULITIVITAMIN WITH MINERALS) TABS Take 1 tablet by mouth daily.  Marland Kitchen omega-3 acid ethyl esters (LOVAZA) 1 G capsule Take 1 g by mouth daily.  . TRAVATAN Z 0.004 % SOLN ophthalmic solution Place 1 drop into the right eye at bedtime.    No facility-administered encounter medications on file as of 03/19/2019.     Review of Systems  Constitutional: Positive for activity change and fatigue. Negative for appetite change, chills, fever and unexpected weight change.  HENT: Negative for congestion, hearing loss and trouble swallowing.   Eyes:       Blind  Respiratory: Negative for chest tightness and shortness of breath.   Cardiovascular: Negative for chest pain, palpitations and leg swelling.  Gastrointestinal: Negative for abdominal pain and constipation.  Genitourinary: Negative for dysuria.  Musculoskeletal: Negative for arthralgias and gait problem.        Uses walker ever since her left hip fx  Skin: Positive for pallor.  Neurological: Negative for dizziness and weakness.  Psychiatric/Behavioral: Negative for agitation, behavioral problems, confusion and sleep disturbance.    Vitals:   03/19/19 0938  BP: 103/60  Pulse: 75  Temp: 98.3 F (36.8 C)  SpO2: 98%  Weight: 100 lb 6.4 oz (45.5 kg)  Height: 5' 10"  (1.778 m)   Body mass index is 14.41 kg/m. Physical Exam Vitals signs and nursing note reviewed.  Constitutional:      General: She is not in acute distress.    Appearance: She is not ill-appearing or toxic-appearing.     Comments: Thin female, does appear pale today vs prior to surgery  HENT:     Head: Normocephalic and atraumatic.     Right Ear: External ear normal.     Left Ear: External ear normal.     Nose: Nose normal.     Mouth/Throat:     Pharynx: Oropharynx is clear.  Eyes:     Extraocular Movements: Extraocular movements intact.     Conjunctiva/sclera: Conjunctivae normal.     Pupils: Pupils are equal, round, and reactive to light.  Cardiovascular:     Rate and Rhythm: Normal rate and regular rhythm.     Heart sounds: No murmur.  Pulmonary:     Effort: Pulmonary effort is normal.     Breath sounds: Normal breath sounds.  Abdominal:     General: Bowel sounds are normal.     Palpations: Abdomen is soft.     Tenderness: There is no abdominal tenderness.  Skin:    Comments: Left mastectomy incision site clean and dry; has surrounding yellow ecchymoses; has two JP drains from beneath breast region both this afternoon with just a few cc of serosanguinous drainage present (dark red); resident was wearing her corset and had telfa dressings inside it  Neurological:     General: No focal deficit present.     Mental Status: She is alert and oriented to person, place, and time.  Psychiatric:        Mood and Affect: Mood normal.        Behavior: Behavior normal.        Thought Content: Thought content normal.         Judgment: Judgment normal.     Labs reviewed: Basic Metabolic Panel: Recent Labs    04/30/18 0700 03/15/19 0543  NA 139 133*  K 4.2 3.4*  CL  --  96*  CO2  --  28  GLUCOSE  --  101*  BUN 14 12  CREATININE 0.4* 0.52  CALCIUM  --  9.1   Liver Function Tests: Recent Labs    04/30/18 0700 03/15/19 0543  AST 17 19  ALT 9 12  ALKPHOS 86 70  BILITOT  --  0.9  PROT  --  7.1  ALBUMIN  --  3.6   No results for input(s): LIPASE, AMYLASE in the last 8760 hours. No results for input(s): AMMONIA in the last 8760 hours. CBC: Recent Labs    04/30/18 0700 03/15/19 0543  WBC 4.8 5.9  NEUTROABS  --  3.9  HGB 14.1 13.1  HCT 40 38.7  MCV  --  96.0  PLT 204 215   Cardiac Enzymes: No results for input(s): CKTOTAL, CKMB, CKMBINDEX, TROPONINI in the last 8760 hours. BNP: Invalid input(s): POCBNP Lab Results  Component Value Date   HGBA1C 5.8 04/30/2018   No results found for: TSH No results found for: VITAMINB12 No results found for: FOLATE No results found for: IRON, TIBC, FERRITIN  Op note, h&P, d/c summary and pathology reports reviewed  Assessment/Plan 1. Invasive ductal carcinoma of breast, female, left ([place]) -s/p palliative mastectomy and removal of affected lymph nodes -has drains in place -having minimal discomfort -doing very well overall -keep f/u with Dr. Dalbert Batman 9/30 as planned  2. Metastatic disease ([place]) -to axillary nodes (2) per path--these were removed -resident is clear she does not want further treatment even if it was recommended to her  3. Legal blindness -advanced--she does not want to die completely blind, though we cannot control this--she'd prefer to die of something else first  4. Hyperglycemia -hba1c has been great--she makes good food choices and walks regularly  5. Senile osteoporosis -on only vitamins and weightbearing exercises, opted not to take other treatments  Family/ staff Communication:   Discussed with med tech on unit  and nurse manager  Labs/tests ordered:  Has labs before her visit with me early Nov  Krystal [name], D.O. [facility] [address], [place] [address] Cell Phone (Mon-Fri 8am-5pm):  [phone] On Call:  [phone] & follow prompts after 5pm & weekends Office Phone:  [phone] Office Fax:  [phone]

## 2019-03-27 DIAGNOSIS — Z9189 Other specified personal risk factors, not elsewhere classified: Secondary | ICD-10-CM | POA: Diagnosis not present

## 2019-03-27 LAB — NOVEL CORONAVIRUS, NAA: SARS-CoV-2, NAA: NOT DETECTED

## 2019-03-28 DIAGNOSIS — H353231 Exudative age-related macular degeneration, bilateral, with active choroidal neovascularization: Secondary | ICD-10-CM | POA: Diagnosis not present

## 2019-04-04 DIAGNOSIS — Z20828 Contact with and (suspected) exposure to other viral communicable diseases: Secondary | ICD-10-CM | POA: Diagnosis not present

## 2019-04-05 ENCOUNTER — Other Ambulatory Visit: Payer: Self-pay | Admitting: *Deleted

## 2019-04-05 LAB — NOVEL CORONAVIRUS, NAA: SARS-CoV-2, NAA: NOT DETECTED

## 2019-04-10 DIAGNOSIS — Z9189 Other specified personal risk factors, not elsewhere classified: Secondary | ICD-10-CM | POA: Diagnosis not present

## 2019-04-16 DIAGNOSIS — Z9189 Other specified personal risk factors, not elsewhere classified: Secondary | ICD-10-CM | POA: Diagnosis not present

## 2019-04-17 DIAGNOSIS — H543 Unqualified visual loss, both eyes: Secondary | ICD-10-CM | POA: Diagnosis not present

## 2019-04-17 DIAGNOSIS — M6389 Disorders of muscle in diseases classified elsewhere, multiple sites: Secondary | ICD-10-CM | POA: Diagnosis not present

## 2019-04-17 DIAGNOSIS — R600 Localized edema: Secondary | ICD-10-CM | POA: Diagnosis not present

## 2019-04-17 DIAGNOSIS — I972 Postmastectomy lymphedema syndrome: Secondary | ICD-10-CM | POA: Diagnosis not present

## 2019-04-19 DIAGNOSIS — H543 Unqualified visual loss, both eyes: Secondary | ICD-10-CM | POA: Diagnosis not present

## 2019-04-19 DIAGNOSIS — R600 Localized edema: Secondary | ICD-10-CM | POA: Diagnosis not present

## 2019-04-19 DIAGNOSIS — M6389 Disorders of muscle in diseases classified elsewhere, multiple sites: Secondary | ICD-10-CM | POA: Diagnosis not present

## 2019-04-19 DIAGNOSIS — I972 Postmastectomy lymphedema syndrome: Secondary | ICD-10-CM | POA: Diagnosis not present

## 2019-04-22 DIAGNOSIS — R600 Localized edema: Secondary | ICD-10-CM | POA: Diagnosis not present

## 2019-04-22 DIAGNOSIS — H543 Unqualified visual loss, both eyes: Secondary | ICD-10-CM | POA: Diagnosis not present

## 2019-04-22 DIAGNOSIS — M6389 Disorders of muscle in diseases classified elsewhere, multiple sites: Secondary | ICD-10-CM | POA: Diagnosis not present

## 2019-04-22 DIAGNOSIS — I972 Postmastectomy lymphedema syndrome: Secondary | ICD-10-CM | POA: Diagnosis not present

## 2019-04-24 DIAGNOSIS — H543 Unqualified visual loss, both eyes: Secondary | ICD-10-CM | POA: Diagnosis not present

## 2019-04-24 DIAGNOSIS — I972 Postmastectomy lymphedema syndrome: Secondary | ICD-10-CM | POA: Diagnosis not present

## 2019-04-24 DIAGNOSIS — R600 Localized edema: Secondary | ICD-10-CM | POA: Diagnosis not present

## 2019-04-24 DIAGNOSIS — M6389 Disorders of muscle in diseases classified elsewhere, multiple sites: Secondary | ICD-10-CM | POA: Diagnosis not present

## 2019-04-26 ENCOUNTER — Encounter: Payer: Self-pay | Admitting: Internal Medicine

## 2019-04-26 DIAGNOSIS — E119 Type 2 diabetes mellitus without complications: Secondary | ICD-10-CM | POA: Diagnosis not present

## 2019-04-26 DIAGNOSIS — I972 Postmastectomy lymphedema syndrome: Secondary | ICD-10-CM | POA: Diagnosis not present

## 2019-04-26 DIAGNOSIS — E785 Hyperlipidemia, unspecified: Secondary | ICD-10-CM | POA: Diagnosis not present

## 2019-04-26 DIAGNOSIS — M6389 Disorders of muscle in diseases classified elsewhere, multiple sites: Secondary | ICD-10-CM | POA: Diagnosis not present

## 2019-04-26 DIAGNOSIS — D649 Anemia, unspecified: Secondary | ICD-10-CM | POA: Diagnosis not present

## 2019-04-26 DIAGNOSIS — H543 Unqualified visual loss, both eyes: Secondary | ICD-10-CM | POA: Diagnosis not present

## 2019-04-26 DIAGNOSIS — R600 Localized edema: Secondary | ICD-10-CM | POA: Diagnosis not present

## 2019-04-26 LAB — BASIC METABOLIC PANEL
BUN: 18 (ref 4–21)
Creatinine: 0.4 — AB (ref 0.5–1.1)
Glucose: 82
Potassium: 3.9 (ref 3.4–5.3)
Sodium: 138 (ref 137–147)

## 2019-04-26 LAB — LIPID PANEL
Cholesterol: 159 (ref 0–200)
HDL: 67 (ref 35–70)
LDL Cholesterol: 82
Triglycerides: 49 (ref 40–160)

## 2019-04-26 LAB — HEMOGLOBIN A1C: Hemoglobin A1C: 4.8

## 2019-04-26 LAB — CBC AND DIFFERENTIAL
HCT: 37 (ref 36–46)
Hemoglobin: 13.2 (ref 12.0–16.0)
Platelets: 197 (ref 150–399)
WBC: 4.3

## 2019-04-26 LAB — HEPATIC FUNCTION PANEL
ALT: 9 (ref 7–35)
AST: 12 — AB (ref 13–35)
Alkaline Phosphatase: 79 (ref 25–125)
Bilirubin, Total: 0.3

## 2019-04-29 DIAGNOSIS — Z9012 Acquired absence of left breast and nipple: Secondary | ICD-10-CM | POA: Diagnosis not present

## 2019-04-29 DIAGNOSIS — R600 Localized edema: Secondary | ICD-10-CM | POA: Diagnosis not present

## 2019-04-29 DIAGNOSIS — I972 Postmastectomy lymphedema syndrome: Secondary | ICD-10-CM | POA: Diagnosis not present

## 2019-04-29 DIAGNOSIS — M6389 Disorders of muscle in diseases classified elsewhere, multiple sites: Secondary | ICD-10-CM | POA: Diagnosis not present

## 2019-04-29 DIAGNOSIS — H543 Unqualified visual loss, both eyes: Secondary | ICD-10-CM | POA: Diagnosis not present

## 2019-04-30 DIAGNOSIS — Z9189 Other specified personal risk factors, not elsewhere classified: Secondary | ICD-10-CM | POA: Diagnosis not present

## 2019-05-01 ENCOUNTER — Non-Acute Institutional Stay: Payer: Medicare Other | Admitting: Internal Medicine

## 2019-05-01 ENCOUNTER — Encounter: Payer: Self-pay | Admitting: Internal Medicine

## 2019-05-01 ENCOUNTER — Other Ambulatory Visit: Payer: Self-pay

## 2019-05-01 VITALS — BP 124/82 | HR 90 | Temp 98.6°F | Ht 69.0 in | Wt 97.8 lb

## 2019-05-01 DIAGNOSIS — M810 Age-related osteoporosis without current pathological fracture: Secondary | ICD-10-CM | POA: Diagnosis not present

## 2019-05-01 DIAGNOSIS — H548 Legal blindness, as defined in USA: Secondary | ICD-10-CM

## 2019-05-01 DIAGNOSIS — I890 Lymphedema, not elsewhere classified: Secondary | ICD-10-CM

## 2019-05-01 DIAGNOSIS — Z9012 Acquired absence of left breast and nipple: Secondary | ICD-10-CM | POA: Diagnosis not present

## 2019-05-01 DIAGNOSIS — R634 Abnormal weight loss: Secondary | ICD-10-CM | POA: Diagnosis not present

## 2019-05-01 DIAGNOSIS — C773 Secondary and unspecified malignant neoplasm of axilla and upper limb lymph nodes: Secondary | ICD-10-CM

## 2019-05-01 DIAGNOSIS — H543 Unqualified visual loss, both eyes: Secondary | ICD-10-CM | POA: Diagnosis not present

## 2019-05-01 DIAGNOSIS — R600 Localized edema: Secondary | ICD-10-CM | POA: Diagnosis not present

## 2019-05-01 DIAGNOSIS — I972 Postmastectomy lymphedema syndrome: Secondary | ICD-10-CM | POA: Diagnosis not present

## 2019-05-01 DIAGNOSIS — C50912 Malignant neoplasm of unspecified site of left female breast: Secondary | ICD-10-CM

## 2019-05-01 DIAGNOSIS — M6389 Disorders of muscle in diseases classified elsewhere, multiple sites: Secondary | ICD-10-CM | POA: Diagnosis not present

## 2019-05-01 NOTE — Progress Notes (Signed)
Location:  [facility] Room Number: [id number] Place of Service:  Clinic (12)  Provider: Elianne Gubser L. Mariea Clonts, D.O., C.M.D.  Code Status: DNR, MOST Goals of Care:  Advanced Directives 05/01/2019  Does Patient Have a Medical Advance Directive? Yes  Type of Paramedic of [place];Out of facility DNR (pink MOST or yellow form)  Does patient want to make changes to medical advance directive? No - Patient declined  Copy of [place] in Chart? Yes - validated most recent copy scanned in chart (See row information)  Pre-existing out of facility DNR order (yellow form or pink MOST form) Yellow form placed in chart (order not valid for inpatient use);Pink MOST form placed in chart (order not valid for inpatient use)     Chief Complaint  Patient presents with  . Medical Management of Chronic Issues    6 month follow-up and discuss labs (copy available)   . Immunizations    Discuss need for TD/TDaP    HPI: Patient is a 85 y.o. female seen today for medical management of chronic diseases.    She's energyless.  She has no energy to do anything.  She wonders if it's ever going to get better.  She admits to eating even less.  She has a friend who once every two weeks shops some for her.  Sometimes, items are not available that she wants.  She's not doing the walking that she did so that's affecting her appetite.  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  It was dry and hard.  It almost all beef.  She also has a lack of incentive.  She is 97.8 lbs today and was 100.4 lbs in September.  Sits and listens to [place].    She has to do the therapy to get the fluid out of her axilla where her lymph nodes were removed.  Continues to be bothered by not being able to see which makes everything more challenging.  Past Medical History:  Diagnosis Date  . Anxiety   . Arthritis   . Back pain   . Borderline diabetes   . Compression  fracture of lumbar vertebra ([place]) 2013  . Constipation   . Displaced intertrochanteric fracture of left femur ([place])   . Fracture of unspecified part of neck of unspecified femur, initial encounter for closed fracture ([place])   . GERD (gastroesophageal reflux disease)   . Hyperglycemia   . Hypo-osmolality and hyponatremia   . Macular degeneration    both eyes, blind  . Osteoporosis     Past Surgical History:  Procedure Laterality Date  . CATARACT EXTRACTION    . HIP FRACTURE SURGERY Left 2016  . INTRAMEDULLARY (IM) NAIL INTERTROCHANTERIC Left 03/24/2015   Procedure: INTRAMEDULLARY (IM) NAIL INTERTROCHANTRIC;  Surgeon: Rod Can, MD;  Location: [facility];  Service: Orthopedics;  Laterality: Left;  Marland Kitchen MASTECTOMY WITH AXILLARY LYMPH NODE DISSECTION Left 03/15/2019   Procedure: LEFT TOTAL MASTECTOMY WITH REMOVAL OF INVOLVED LYMPH NODES;  Surgeon: Fanny Skates, MD;  Location: [facility];  Service: General;  Laterality: Left;  . TONSILLECTOMY      No Known Allergies  Outpatient Encounter Medications as of 05/01/2019  Medication Sig  . acetaminophen (TYLENOL) 500 MG tablet Take 1,000 mg by mouth every 6 (six) hours as needed. For pain.  . cholecalciferol (VITAMIN D) 1000 UNITS tablet Take 2,000 Units by mouth daily.  . dorzolamide-timolol (COSOPT) 22.3-6.8 MG/ML ophthalmic solution Place 1 drop into the right eye  2 (two) times daily.  . Multiple Vitamin (MULITIVITAMIN WITH MINERALS) TABS Take 1 tablet by mouth daily.  Marland Kitchen omega-3 acid ethyl esters (LOVAZA) 1 G capsule Take 1 g by mouth daily.  . TRAVATAN Z 0.004 % SOLN ophthalmic solution Place 1 drop into the right eye at bedtime.    No facility-administered encounter medications on file as of 05/01/2019.     Review of Systems:  Review of Systems  Constitutional: Positive for malaise/fatigue and weight loss. Negative for chills and fever.  HENT: Negative for hearing loss.   Eyes:       Legally blind  Respiratory: Negative for cough and  shortness of breath.   Cardiovascular: Negative for chest pain, palpitations and leg swelling.  Gastrointestinal: Negative for abdominal pain and constipation.  Genitourinary: Negative for dysuria.  Musculoskeletal: Negative for back pain, falls and joint pain.  Skin: Negative for itching and rash.  Neurological: Negative for dizziness and loss of consciousness.  Endo/Heme/Allergies: Bruises/bleeds easily.  Psychiatric/Behavioral: Negative for depression and memory loss. The patient is not nervous/anxious and does not have insomnia.     Health Maintenance  Topic Date Due  . TETANUS/TDAP  12/16/1952  . DEXA SCAN  10/25/2023 (Originally 12/17/1998)  . INFLUENZA VACCINE  Completed  . PNA vac Low Risk Adult  Completed    Physical Exam: Vitals:   05/01/19 0846  BP: 124/82  Pulse: 90  Temp: 98.6 F (37 C)  TempSrc: Oral  SpO2: 99%  Weight: 97 lb 12.8 oz (44.4 kg)  Height: 5\' 9"  (1.753 m)   Body mass index is 14.44 kg/m. Physical Exam Vitals signs reviewed.  Constitutional:      General: She is not in acute distress.    Appearance: She is not toxic-appearing.     Comments: Cachectic female, walks with rolling walker  Cardiovascular:     Rate and Rhythm: Normal rate and regular rhythm.     Pulses: Normal pulses.     Heart sounds: Normal heart sounds.  Pulmonary:     Effort: Pulmonary effort is normal.     Breath sounds: Normal breath sounds. No wheezing, rhonchi or rales.  Musculoskeletal:     Comments: Some decreased abduction of left arm  Skin:    Comments: Left midaxillary line at height of top of bra--some fluid accumulation present  Neurological:     General: No focal deficit present.     Mental Status: She is alert and oriented to person, place, and time. Mental status is at baseline.  Psychiatric:        Mood and Affect: Mood normal.     Labs reviewed: Basic Metabolic Panel: Recent Labs    03/15/19 0543 04/26/19  NA 133* 138  K 3.4* 3.9  CL 96*  --    CO2 28  --   GLUCOSE 101*  --   BUN 12 18  CREATININE 0.52 0.4*  CALCIUM 9.1  --    Liver Function Tests: Recent Labs    03/15/19 0543 04/26/19  AST 19 12*  ALT 12 9  ALKPHOS 70 79  BILITOT 0.9  --   PROT 7.1  --   ALBUMIN 3.6  --    No results for input(s): LIPASE, AMYLASE in the last 8760 hours. No results for input(s): AMMONIA in the last 8760 hours. CBC: Recent Labs    03/15/19 0543 04/26/19  WBC 5.9 4.3  NEUTROABS 3.9  --   HGB 13.1 13.2  HCT 38.7 37  MCV 96.0  --  PLT 215 197   Lipid Panel: Recent Labs    04/26/19  CHOL 159  HDL 67  LDLCALC 82  TRIG 49   Lab Results  Component Value Date   HGBA1C 4.8 04/26/2019   Assessment/Plan 1. Invasive ductal carcinoma of breast, female, left ([place]) -s/p mastectomy -healing well -remains weak and fatigued though hgb now normal (not back to baseline though)  2. 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 Legal blindness -progressively losing more vision which affects her function and makes her unhappy  4. Weight loss -does not like food here and has limited diet  -eating less even than before -anticipates she will be seeing the dietitian pop in soon due to her weight loss  5. Senile osteoporosis -cont vitamins and weightbearing exercise, is walking less due to low energy and motivation  6. Lymphedema of left upper extremity -cont OT for this and for left arm ROM  Labs/tests ordered:  No new Next appt:  3 mos.  07/30/18 at 11am--f/u on weight  Jermarion Poffenberger L. Alexys Lobello, D.O. [facility] [address], [place] [address] Cell Phone (Mon-Fri 8am-5pm):  [phone] On Call:  [phone] & follow prompts after 5pm & weekends Office Phone:  [phone] Office Fax:  [phone]

## 2019-05-02 ENCOUNTER — Encounter: Payer: Self-pay | Admitting: Internal Medicine

## 2019-05-03 DIAGNOSIS — R600 Localized edema: Secondary | ICD-10-CM | POA: Diagnosis not present

## 2019-05-03 DIAGNOSIS — M6389 Disorders of muscle in diseases classified elsewhere, multiple sites: Secondary | ICD-10-CM | POA: Diagnosis not present

## 2019-05-03 DIAGNOSIS — H543 Unqualified visual loss, both eyes: Secondary | ICD-10-CM | POA: Diagnosis not present

## 2019-05-03 DIAGNOSIS — Z9012 Acquired absence of left breast and nipple: Secondary | ICD-10-CM | POA: Diagnosis not present

## 2019-05-03 DIAGNOSIS — I972 Postmastectomy lymphedema syndrome: Secondary | ICD-10-CM | POA: Diagnosis not present

## 2019-05-06 DIAGNOSIS — M6389 Disorders of muscle in diseases classified elsewhere, multiple sites: Secondary | ICD-10-CM | POA: Diagnosis not present

## 2019-05-06 DIAGNOSIS — Z9012 Acquired absence of left breast and nipple: Secondary | ICD-10-CM | POA: Diagnosis not present

## 2019-05-06 DIAGNOSIS — H543 Unqualified visual loss, both eyes: Secondary | ICD-10-CM | POA: Diagnosis not present

## 2019-05-06 DIAGNOSIS — I972 Postmastectomy lymphedema syndrome: Secondary | ICD-10-CM | POA: Diagnosis not present

## 2019-05-06 DIAGNOSIS — R600 Localized edema: Secondary | ICD-10-CM | POA: Diagnosis not present

## 2019-05-08 DIAGNOSIS — Z9012 Acquired absence of left breast and nipple: Secondary | ICD-10-CM | POA: Diagnosis not present

## 2019-05-08 DIAGNOSIS — I972 Postmastectomy lymphedema syndrome: Secondary | ICD-10-CM | POA: Diagnosis not present

## 2019-05-08 DIAGNOSIS — H543 Unqualified visual loss, both eyes: Secondary | ICD-10-CM | POA: Diagnosis not present

## 2019-05-08 DIAGNOSIS — Z9189 Other specified personal risk factors, not elsewhere classified: Secondary | ICD-10-CM | POA: Diagnosis not present

## 2019-05-08 DIAGNOSIS — R600 Localized edema: Secondary | ICD-10-CM | POA: Diagnosis not present

## 2019-05-08 DIAGNOSIS — M6389 Disorders of muscle in diseases classified elsewhere, multiple sites: Secondary | ICD-10-CM | POA: Diagnosis not present

## 2019-05-10 DIAGNOSIS — Z9012 Acquired absence of left breast and nipple: Secondary | ICD-10-CM | POA: Diagnosis not present

## 2019-05-10 DIAGNOSIS — H543 Unqualified visual loss, both eyes: Secondary | ICD-10-CM | POA: Diagnosis not present

## 2019-05-10 DIAGNOSIS — R600 Localized edema: Secondary | ICD-10-CM | POA: Diagnosis not present

## 2019-05-10 DIAGNOSIS — M6389 Disorders of muscle in diseases classified elsewhere, multiple sites: Secondary | ICD-10-CM | POA: Diagnosis not present

## 2019-05-10 DIAGNOSIS — I972 Postmastectomy lymphedema syndrome: Secondary | ICD-10-CM | POA: Diagnosis not present

## 2019-05-13 DIAGNOSIS — M6389 Disorders of muscle in diseases classified elsewhere, multiple sites: Secondary | ICD-10-CM | POA: Diagnosis not present

## 2019-05-13 DIAGNOSIS — I972 Postmastectomy lymphedema syndrome: Secondary | ICD-10-CM | POA: Diagnosis not present

## 2019-05-13 DIAGNOSIS — R600 Localized edema: Secondary | ICD-10-CM | POA: Diagnosis not present

## 2019-05-13 DIAGNOSIS — H543 Unqualified visual loss, both eyes: Secondary | ICD-10-CM | POA: Diagnosis not present

## 2019-05-13 DIAGNOSIS — Z9012 Acquired absence of left breast and nipple: Secondary | ICD-10-CM | POA: Diagnosis not present

## 2019-05-14 DIAGNOSIS — Z9189 Other specified personal risk factors, not elsewhere classified: Secondary | ICD-10-CM | POA: Diagnosis not present

## 2019-05-15 DIAGNOSIS — I972 Postmastectomy lymphedema syndrome: Secondary | ICD-10-CM | POA: Diagnosis not present

## 2019-05-15 DIAGNOSIS — Z9012 Acquired absence of left breast and nipple: Secondary | ICD-10-CM | POA: Diagnosis not present

## 2019-05-15 DIAGNOSIS — M6389 Disorders of muscle in diseases classified elsewhere, multiple sites: Secondary | ICD-10-CM | POA: Diagnosis not present

## 2019-05-15 DIAGNOSIS — H543 Unqualified visual loss, both eyes: Secondary | ICD-10-CM | POA: Diagnosis not present

## 2019-05-15 DIAGNOSIS — R600 Localized edema: Secondary | ICD-10-CM | POA: Diagnosis not present

## 2019-05-17 DIAGNOSIS — M6389 Disorders of muscle in diseases classified elsewhere, multiple sites: Secondary | ICD-10-CM | POA: Diagnosis not present

## 2019-05-17 DIAGNOSIS — R600 Localized edema: Secondary | ICD-10-CM | POA: Diagnosis not present

## 2019-05-17 DIAGNOSIS — I972 Postmastectomy lymphedema syndrome: Secondary | ICD-10-CM | POA: Diagnosis not present

## 2019-05-17 DIAGNOSIS — H543 Unqualified visual loss, both eyes: Secondary | ICD-10-CM | POA: Diagnosis not present

## 2019-05-17 DIAGNOSIS — Z9012 Acquired absence of left breast and nipple: Secondary | ICD-10-CM | POA: Diagnosis not present

## 2019-05-20 DIAGNOSIS — H543 Unqualified visual loss, both eyes: Secondary | ICD-10-CM | POA: Diagnosis not present

## 2019-05-20 DIAGNOSIS — M6389 Disorders of muscle in diseases classified elsewhere, multiple sites: Secondary | ICD-10-CM | POA: Diagnosis not present

## 2019-05-20 DIAGNOSIS — I972 Postmastectomy lymphedema syndrome: Secondary | ICD-10-CM | POA: Diagnosis not present

## 2019-05-20 DIAGNOSIS — R600 Localized edema: Secondary | ICD-10-CM | POA: Diagnosis not present

## 2019-05-20 DIAGNOSIS — Z9012 Acquired absence of left breast and nipple: Secondary | ICD-10-CM | POA: Diagnosis not present

## 2019-05-22 DIAGNOSIS — H543 Unqualified visual loss, both eyes: Secondary | ICD-10-CM | POA: Diagnosis not present

## 2019-05-22 DIAGNOSIS — I972 Postmastectomy lymphedema syndrome: Secondary | ICD-10-CM | POA: Diagnosis not present

## 2019-05-22 DIAGNOSIS — M6389 Disorders of muscle in diseases classified elsewhere, multiple sites: Secondary | ICD-10-CM | POA: Diagnosis not present

## 2019-05-22 DIAGNOSIS — Z9189 Other specified personal risk factors, not elsewhere classified: Secondary | ICD-10-CM | POA: Diagnosis not present

## 2019-05-22 DIAGNOSIS — Z9012 Acquired absence of left breast and nipple: Secondary | ICD-10-CM | POA: Diagnosis not present

## 2019-05-22 DIAGNOSIS — R600 Localized edema: Secondary | ICD-10-CM | POA: Diagnosis not present

## 2019-05-22 DIAGNOSIS — Z20828 Contact with and (suspected) exposure to other viral communicable diseases: Secondary | ICD-10-CM | POA: Diagnosis not present

## 2019-05-30 DIAGNOSIS — H353231 Exudative age-related macular degeneration, bilateral, with active choroidal neovascularization: Secondary | ICD-10-CM | POA: Diagnosis not present

## 2019-05-31 DIAGNOSIS — Z9012 Acquired absence of left breast and nipple: Secondary | ICD-10-CM | POA: Diagnosis not present

## 2019-05-31 DIAGNOSIS — R600 Localized edema: Secondary | ICD-10-CM | POA: Diagnosis not present

## 2019-05-31 DIAGNOSIS — I972 Postmastectomy lymphedema syndrome: Secondary | ICD-10-CM | POA: Diagnosis not present

## 2019-05-31 DIAGNOSIS — H543 Unqualified visual loss, both eyes: Secondary | ICD-10-CM | POA: Diagnosis not present

## 2019-05-31 DIAGNOSIS — M6389 Disorders of muscle in diseases classified elsewhere, multiple sites: Secondary | ICD-10-CM | POA: Diagnosis not present

## 2019-06-03 DIAGNOSIS — H543 Unqualified visual loss, both eyes: Secondary | ICD-10-CM | POA: Diagnosis not present

## 2019-06-03 DIAGNOSIS — Z9012 Acquired absence of left breast and nipple: Secondary | ICD-10-CM | POA: Diagnosis not present

## 2019-06-03 DIAGNOSIS — M6389 Disorders of muscle in diseases classified elsewhere, multiple sites: Secondary | ICD-10-CM | POA: Diagnosis not present

## 2019-06-03 DIAGNOSIS — I972 Postmastectomy lymphedema syndrome: Secondary | ICD-10-CM | POA: Diagnosis not present

## 2019-06-03 DIAGNOSIS — R600 Localized edema: Secondary | ICD-10-CM | POA: Diagnosis not present

## 2019-06-05 DIAGNOSIS — I972 Postmastectomy lymphedema syndrome: Secondary | ICD-10-CM | POA: Diagnosis not present

## 2019-06-05 DIAGNOSIS — M6389 Disorders of muscle in diseases classified elsewhere, multiple sites: Secondary | ICD-10-CM | POA: Diagnosis not present

## 2019-06-05 DIAGNOSIS — R600 Localized edema: Secondary | ICD-10-CM | POA: Diagnosis not present

## 2019-06-05 DIAGNOSIS — Z9012 Acquired absence of left breast and nipple: Secondary | ICD-10-CM | POA: Diagnosis not present

## 2019-06-05 DIAGNOSIS — H543 Unqualified visual loss, both eyes: Secondary | ICD-10-CM | POA: Diagnosis not present

## 2019-06-07 DIAGNOSIS — M6389 Disorders of muscle in diseases classified elsewhere, multiple sites: Secondary | ICD-10-CM | POA: Diagnosis not present

## 2019-06-07 DIAGNOSIS — I972 Postmastectomy lymphedema syndrome: Secondary | ICD-10-CM | POA: Diagnosis not present

## 2019-06-07 DIAGNOSIS — R600 Localized edema: Secondary | ICD-10-CM | POA: Diagnosis not present

## 2019-06-07 DIAGNOSIS — Z9012 Acquired absence of left breast and nipple: Secondary | ICD-10-CM | POA: Diagnosis not present

## 2019-06-07 DIAGNOSIS — H543 Unqualified visual loss, both eyes: Secondary | ICD-10-CM | POA: Diagnosis not present

## 2019-06-10 DIAGNOSIS — Z9012 Acquired absence of left breast and nipple: Secondary | ICD-10-CM | POA: Diagnosis not present

## 2019-06-10 DIAGNOSIS — M6389 Disorders of muscle in diseases classified elsewhere, multiple sites: Secondary | ICD-10-CM | POA: Diagnosis not present

## 2019-06-10 DIAGNOSIS — I972 Postmastectomy lymphedema syndrome: Secondary | ICD-10-CM | POA: Diagnosis not present

## 2019-06-10 DIAGNOSIS — H543 Unqualified visual loss, both eyes: Secondary | ICD-10-CM | POA: Diagnosis not present

## 2019-06-10 DIAGNOSIS — R600 Localized edema: Secondary | ICD-10-CM | POA: Diagnosis not present

## 2019-06-14 DIAGNOSIS — I972 Postmastectomy lymphedema syndrome: Secondary | ICD-10-CM | POA: Diagnosis not present

## 2019-06-14 DIAGNOSIS — H543 Unqualified visual loss, both eyes: Secondary | ICD-10-CM | POA: Diagnosis not present

## 2019-06-14 DIAGNOSIS — Z9012 Acquired absence of left breast and nipple: Secondary | ICD-10-CM | POA: Diagnosis not present

## 2019-06-14 DIAGNOSIS — M6389 Disorders of muscle in diseases classified elsewhere, multiple sites: Secondary | ICD-10-CM | POA: Diagnosis not present

## 2019-06-14 DIAGNOSIS — R600 Localized edema: Secondary | ICD-10-CM | POA: Diagnosis not present

## 2019-06-17 DIAGNOSIS — M6389 Disorders of muscle in diseases classified elsewhere, multiple sites: Secondary | ICD-10-CM | POA: Diagnosis not present

## 2019-06-17 DIAGNOSIS — R600 Localized edema: Secondary | ICD-10-CM | POA: Diagnosis not present

## 2019-06-17 DIAGNOSIS — H543 Unqualified visual loss, both eyes: Secondary | ICD-10-CM | POA: Diagnosis not present

## 2019-06-17 DIAGNOSIS — Z9012 Acquired absence of left breast and nipple: Secondary | ICD-10-CM | POA: Diagnosis not present

## 2019-06-17 DIAGNOSIS — I972 Postmastectomy lymphedema syndrome: Secondary | ICD-10-CM | POA: Diagnosis not present

## 2019-06-19 DIAGNOSIS — H543 Unqualified visual loss, both eyes: Secondary | ICD-10-CM | POA: Diagnosis not present

## 2019-06-19 DIAGNOSIS — R600 Localized edema: Secondary | ICD-10-CM | POA: Diagnosis not present

## 2019-06-19 DIAGNOSIS — I972 Postmastectomy lymphedema syndrome: Secondary | ICD-10-CM | POA: Diagnosis not present

## 2019-06-19 DIAGNOSIS — Z9012 Acquired absence of left breast and nipple: Secondary | ICD-10-CM | POA: Diagnosis not present

## 2019-06-19 DIAGNOSIS — M6389 Disorders of muscle in diseases classified elsewhere, multiple sites: Secondary | ICD-10-CM | POA: Diagnosis not present

## 2019-06-26 DIAGNOSIS — M6389 Disorders of muscle in diseases classified elsewhere, multiple sites: Secondary | ICD-10-CM | POA: Diagnosis not present

## 2019-06-26 DIAGNOSIS — Z9012 Acquired absence of left breast and nipple: Secondary | ICD-10-CM | POA: Diagnosis not present

## 2019-06-26 DIAGNOSIS — H543 Unqualified visual loss, both eyes: Secondary | ICD-10-CM | POA: Diagnosis not present

## 2019-06-26 DIAGNOSIS — I972 Postmastectomy lymphedema syndrome: Secondary | ICD-10-CM | POA: Diagnosis not present

## 2019-06-26 DIAGNOSIS — R600 Localized edema: Secondary | ICD-10-CM | POA: Diagnosis not present

## 2019-07-01 DIAGNOSIS — Z20828 Contact with and (suspected) exposure to other viral communicable diseases: Secondary | ICD-10-CM | POA: Diagnosis not present

## 2019-07-01 DIAGNOSIS — Z9189 Other specified personal risk factors, not elsewhere classified: Secondary | ICD-10-CM | POA: Diagnosis not present

## 2019-07-03 DIAGNOSIS — I972 Postmastectomy lymphedema syndrome: Secondary | ICD-10-CM | POA: Diagnosis not present

## 2019-07-03 DIAGNOSIS — Z9012 Acquired absence of left breast and nipple: Secondary | ICD-10-CM | POA: Diagnosis not present

## 2019-07-03 DIAGNOSIS — H543 Unqualified visual loss, both eyes: Secondary | ICD-10-CM | POA: Diagnosis not present

## 2019-07-03 DIAGNOSIS — R600 Localized edema: Secondary | ICD-10-CM | POA: Diagnosis not present

## 2019-07-03 DIAGNOSIS — M6389 Disorders of muscle in diseases classified elsewhere, multiple sites: Secondary | ICD-10-CM | POA: Diagnosis not present

## 2019-07-09 DIAGNOSIS — Z23 Encounter for immunization: Secondary | ICD-10-CM | POA: Diagnosis not present

## 2019-07-15 DIAGNOSIS — Z20828 Contact with and (suspected) exposure to other viral communicable diseases: Secondary | ICD-10-CM | POA: Diagnosis not present

## 2019-07-15 DIAGNOSIS — Z9189 Other specified personal risk factors, not elsewhere classified: Secondary | ICD-10-CM | POA: Diagnosis not present

## 2019-07-22 DIAGNOSIS — Z9189 Other specified personal risk factors, not elsewhere classified: Secondary | ICD-10-CM | POA: Diagnosis not present

## 2019-07-22 DIAGNOSIS — Z20828 Contact with and (suspected) exposure to other viral communicable diseases: Secondary | ICD-10-CM | POA: Diagnosis not present

## 2019-07-29 DIAGNOSIS — Z9189 Other specified personal risk factors, not elsewhere classified: Secondary | ICD-10-CM | POA: Diagnosis not present

## 2019-07-29 DIAGNOSIS — Z20828 Contact with and (suspected) exposure to other viral communicable diseases: Secondary | ICD-10-CM | POA: Diagnosis not present

## 2019-07-31 ENCOUNTER — Non-Acute Institutional Stay: Payer: Medicare Other | Admitting: Internal Medicine

## 2019-07-31 ENCOUNTER — Encounter: Payer: Self-pay | Admitting: Internal Medicine

## 2019-07-31 ENCOUNTER — Other Ambulatory Visit: Payer: Self-pay

## 2019-07-31 VITALS — BP 120/52 | HR 73 | Temp 97.9°F | Ht 69.0 in | Wt 98.2 lb

## 2019-07-31 DIAGNOSIS — C50912 Malignant neoplasm of unspecified site of left female breast: Secondary | ICD-10-CM | POA: Diagnosis not present

## 2019-07-31 DIAGNOSIS — R634 Abnormal weight loss: Secondary | ICD-10-CM | POA: Diagnosis not present

## 2019-07-31 DIAGNOSIS — H548 Legal blindness, as defined in USA: Secondary | ICD-10-CM

## 2019-07-31 DIAGNOSIS — R739 Hyperglycemia, unspecified: Secondary | ICD-10-CM

## 2019-07-31 DIAGNOSIS — C773 Secondary and unspecified malignant neoplasm of axilla and upper limb lymph nodes: Secondary | ICD-10-CM | POA: Diagnosis not present

## 2019-07-31 DIAGNOSIS — I890 Lymphedema, not elsewhere classified: Secondary | ICD-10-CM | POA: Diagnosis not present

## 2019-07-31 NOTE — Progress Notes (Signed)
Location:   [facility] of Service:  Clinic (12)  Provider: Hlee Fringer L. Mariea Clonts, D.O., C.M.D.  Code Status: DNR, MOST Goals of Care:  Advanced Directives 07/31/2019  Does Patient Have a Medical Advance Directive? Yes  Type of Paramedic of [place];Out of facility DNR (pink MOST or yellow form)  Does patient want to make changes to medical advance directive? No - Patient declined  Copy of [place] in Chart? -  Pre-existing out of facility DNR order (yellow form or pink MOST form) -    Chief Complaint  Patient presents with  . Medical Management of Chronic Issues    3 month follow up     HPI: Patient is a 86 y.o. female seen today for medical management of chronic diseases.    Her life is more complicated with her fading vision--things take twice as long.  Even her magnification machine does not work as well now.  Her mail requires the machine, then has to sort them according to whether she needs to pay them.  She would rather be dead than ask for help to do these things.  She's been so very independent.  She does not want people to know all of these things about her.  Has tricks to distinguish right and left shoes and black and blue pants.  She also puts her own items in the closet.    Weight has stabilized.  It's up part of a lb.  She does not like the food with all of the marinades and fat in it.  She is eating more sandwiches.  She will eat half of the bread.      She is now sporting a compression bra and she says the prosthesis is larger than the breast ever was to put pressure on the lymph nodes as she puts it.  OT gave her instructions for keeping the swelling down--it has improved, but she says not due to doing her exercises as instructed.  She got the bra through [employer].   She has not noted any new lumps or bumps.  Bowels doing fine.  She has a container of prune juice daily.  Also she tries to get all of the veggies and  fruit so she has increased flatus.    Past Medical History:  Diagnosis Date  . Anxiety   . Arthritis   . Back pain   . Borderline diabetes   . Compression fracture of lumbar vertebra ([facility]) 2013  . Constipation   . Displaced intertrochanteric fracture of left femur ([facility])   . Fracture of unspecified part of neck of unspecified femur, initial encounter for closed fracture ([facility])   . GERD (gastroesophageal reflux disease)   . Hyperglycemia   . Hypo-osmolality and hyponatremia   . Macular degeneration    both eyes, blind  . Osteoporosis     Past Surgical History:  Procedure Laterality Date  . CATARACT EXTRACTION    . HIP FRACTURE SURGERY Left 2016  . INTRAMEDULLARY (IM) NAIL INTERTROCHANTERIC Left 03/24/2015   Procedure: INTRAMEDULLARY (IM) NAIL INTERTROCHANTRIC;  Surgeon: Rod Can, MD;  Location: [facility];  Service: Orthopedics;  Laterality: Left;  Marland Kitchen MASTECTOMY WITH AXILLARY LYMPH NODE DISSECTION Left 03/15/2019   Procedure: LEFT TOTAL MASTECTOMY WITH REMOVAL OF INVOLVED LYMPH NODES;  Surgeon: Fanny Skates, MD;  Location: [facility];  Service: General;  Laterality: Left;  . TONSILLECTOMY      No Known Allergies  Outpatient Encounter Medications as of  07/31/2019  Medication Sig  . acetaminophen (TYLENOL) 500 MG tablet Take 1,000 mg by mouth every 6 (six) hours as needed. For pain.  . cholecalciferol (VITAMIN D) 1000 UNITS tablet Take 2,000 Units by mouth daily.  . dorzolamide-timolol (COSOPT) 22.3-6.8 MG/ML ophthalmic solution Place 1 drop into the right eye 2 (two) times daily.  . Multiple Vitamin (MULITIVITAMIN WITH MINERALS) TABS Take 1 tablet by mouth daily.  Marland Kitchen omega-3 acid ethyl esters (LOVAZA) 1 G capsule Take 1 g by mouth daily.  . TRAVATAN Z 0.004 % SOLN ophthalmic solution Place 1 drop into the right eye at bedtime.    No facility-administered encounter medications on file as of 07/31/2019.    Review of Systems:  Review of Systems  Constitutional: Negative for chills,  fever, malaise/fatigue and weight loss.  HENT: Negative for congestion, hearing loss and sore throat.   Eyes:       Legal blindness  Respiratory: Negative for cough and shortness of breath.   Cardiovascular: Negative for chest pain, palpitations and leg swelling.  Gastrointestinal: Negative for abdominal pain, blood in stool, constipation, diarrhea, melena, nausea and vomiting.  Genitourinary: Negative for dysuria.  Musculoskeletal: Negative for falls and joint pain.  Skin: Negative for itching and rash.  Neurological: Negative for dizziness and loss of consciousness.  Endo/Heme/Allergies: Does not bruise/bleed easily.  Psychiatric/Behavioral: Negative for depression and memory loss. The patient is not nervous/anxious and does not have insomnia.     Health Maintenance  Topic Date Due  . URINE MICROALBUMIN  12/17/1943  . TETANUS/TDAP  12/16/1952  . DEXA SCAN  10/25/2023 (Originally 12/17/1998)  . INFLUENZA VACCINE  Completed  . PNA vac Low Risk Adult  Completed    Physical Exam: Vitals:   07/31/19 0804  BP: (!) 120/52  Pulse: 73  Temp: 97.9 F (36.6 C)  TempSrc: Temporal  SpO2: 97%  Weight: 98 lb 3.2 oz (44.5 kg)  Height: 5\' 9"  (1.753 m)   Body mass index is 14.5 kg/m. Physical Exam Vitals reviewed. Chaperone present: small amount of swelling present toward axillary line where mastectomy was performed; now using mastectomy bra that has compression component.  Constitutional:      General: She is not in acute distress.    Appearance: She is not toxic-appearing.     Comments: Thin female  HENT:     Head: Normocephalic and atraumatic.     Right Ear: External ear normal.     Left Ear: External ear normal.  Eyes:     Pupils: Pupils are equal, round, and reactive to light.  Cardiovascular:     Rate and Rhythm: Normal rate and regular rhythm.     Pulses: Normal pulses.     Heart sounds: Normal heart sounds.  Pulmonary:     Effort: Pulmonary effort is normal.     Breath  sounds: Normal breath sounds. No wheezing, rhonchi or rales.  Chest:     Chest wall: Edema present. No mass.  Abdominal:     General: Bowel sounds are normal.  Musculoskeletal:        General: Normal range of motion.     Comments: Ambulates with rolling walker with skis  Skin:    General: Skin is warm and dry.     Capillary Refill: Capillary refill takes less than 2 seconds.     Coloration: Skin is pale.  Neurological:     General: No focal deficit present.     Mental Status: She is alert and oriented to person,  place, and time.  Psychiatric:        Mood and Affect: Mood normal.     Labs reviewed: Basic Metabolic Panel: Recent Labs    03/15/19 0543 04/26/19 0000  NA 133* 138  K 3.4* 3.9  CL 96*  --   CO2 28  --   GLUCOSE 101*  --   BUN 12 18  CREATININE 0.52 0.4*  CALCIUM 9.1  --    Liver Function Tests: Recent Labs    03/15/19 0543 04/26/19 0000  AST 19 12*  ALT 12 9  ALKPHOS 70 79  BILITOT 0.9  --   PROT 7.1  --   ALBUMIN 3.6  --    No results for input(s): LIPASE, AMYLASE in the last 8760 hours. No results for input(s): AMMONIA in the last 8760 hours. CBC: Recent Labs    03/15/19 0543 04/26/19 0000  WBC 5.9 4.3  NEUTROABS 3.9  --   HGB 13.1 13.2  HCT 38.7 37  MCV 96.0  --   PLT 215 197   Lipid Panel: Recent Labs    04/26/19 0000  CHOL 159  HDL 67  LDLCALC 82  TRIG 49   Lab Results  Component Value Date   HGBA1C 4.8 04/26/2019    Assessment/Plan 1. Invasive ductal carcinoma of breast, female, left ([facility]) -s/p left mastectomy -she's disappointed at how long the recovery is afterward and that she is still likely to die completely blind which she wants to avoid b/c she likes her independence so much  2. 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 Legal blindness -progressing--seems she is even struggling  to detect colors and really having difficulty managing her own bills now due to the time it takes -she is resistant to getting help with this b/c she likes her privacy and her independence -I'm also concerned she could be taken advantage of due to her visual deficit (fortunately, her cognition is pristine)  4. Weight loss -weight is now stable but she is limited in what she likes to eat here--prefers more fresh fruits and veggies and avoids high fat items and starches due to h/o prediabetes  5. Lymphedema of left upper extremity -appears quite mild at present -moreso of chest wall/axillary region -cont compression bra  6. Hyperglycemia -historical when she came to [place] due to foods at the rehab where she stayed after her hip fx Lab Results  Component Value Date   HGBA1C 4.8 04/26/2019   Labs/tests ordered: cbc, cmp, hba1c before 5 mo f/u  Next appt:  01/08/2020  Chanell Nadeau L. Akeen Ledyard, D.O. [facility] [address], [place] [address] Cell Phone (Mon-Fri 8am-5pm):  [phone] On Call:  [phone] & follow prompts after 5pm & weekends Office Phone:  [phone] Office Fax:  [phone]

## 2019-08-01 DIAGNOSIS — H31012 Macula scars of posterior pole (postinflammatory) (post-traumatic), left eye: Secondary | ICD-10-CM | POA: Diagnosis not present

## 2019-08-01 DIAGNOSIS — Z961 Presence of intraocular lens: Secondary | ICD-10-CM | POA: Diagnosis not present

## 2019-08-01 DIAGNOSIS — H401114 Primary open-angle glaucoma, right eye, indeterminate stage: Secondary | ICD-10-CM | POA: Diagnosis not present

## 2019-08-01 DIAGNOSIS — H353211 Exudative age-related macular degeneration, right eye, with active choroidal neovascularization: Secondary | ICD-10-CM | POA: Diagnosis not present

## 2019-08-07 DIAGNOSIS — Z23 Encounter for immunization: Secondary | ICD-10-CM | POA: Diagnosis not present

## 2019-09-26 DIAGNOSIS — H401114 Primary open-angle glaucoma, right eye, indeterminate stage: Secondary | ICD-10-CM | POA: Diagnosis not present

## 2019-09-26 DIAGNOSIS — Z961 Presence of intraocular lens: Secondary | ICD-10-CM | POA: Diagnosis not present

## 2019-09-26 DIAGNOSIS — H353231 Exudative age-related macular degeneration, bilateral, with active choroidal neovascularization: Secondary | ICD-10-CM | POA: Diagnosis not present

## 2019-09-26 DIAGNOSIS — H31012 Macula scars of posterior pole (postinflammatory) (post-traumatic), left eye: Secondary | ICD-10-CM | POA: Diagnosis not present

## 2019-11-28 DIAGNOSIS — H401114 Primary open-angle glaucoma, right eye, indeterminate stage: Secondary | ICD-10-CM | POA: Diagnosis not present

## 2019-11-28 DIAGNOSIS — Z961 Presence of intraocular lens: Secondary | ICD-10-CM | POA: Diagnosis not present

## 2019-11-28 DIAGNOSIS — H353231 Exudative age-related macular degeneration, bilateral, with active choroidal neovascularization: Secondary | ICD-10-CM | POA: Diagnosis not present

## 2019-11-28 DIAGNOSIS — H31012 Macula scars of posterior pole (postinflammatory) (post-traumatic), left eye: Secondary | ICD-10-CM | POA: Diagnosis not present

## 2020-01-02 LAB — COMPREHENSIVE METABOLIC PANEL
Albumin: 4.5 (ref 3.5–5.0)
Calcium: 9.9 (ref 8.7–10.7)
Globulin: 2.6

## 2020-01-02 LAB — CBC AND DIFFERENTIAL
HCT: 45 (ref 36–46)
Hemoglobin: 15.2 (ref 12.0–16.0)
Platelets: 205 (ref 150–399)
WBC: 5.6

## 2020-01-02 LAB — HEMOGLOBIN A1C: Hemoglobin A1C: 6.2

## 2020-01-02 LAB — BASIC METABOLIC PANEL
BUN: 12 (ref 4–21)
BUN: 13 (ref 4–21)
CO2: 22 (ref 13–22)
Chloride: 94 — AB (ref 99–108)
Creatinine: 0.4 — AB (ref 0.5–1.1)
Glucose: 62
Potassium: 4.1 (ref 3.4–5.3)
Sodium: 134 — AB (ref 137–147)

## 2020-01-02 LAB — CBC: RBC: 4.78 (ref 3.87–5.11)

## 2020-01-02 LAB — HEPATIC FUNCTION PANEL
ALT: 10 (ref 7–35)
AST: 16 (ref 13–35)

## 2020-01-08 ENCOUNTER — Encounter: Payer: Self-pay | Admitting: Internal Medicine

## 2020-01-08 ENCOUNTER — Non-Acute Institutional Stay: Payer: Medicare Other | Admitting: Internal Medicine

## 2020-01-08 ENCOUNTER — Other Ambulatory Visit: Payer: Self-pay

## 2020-01-08 VITALS — BP 122/78 | HR 93 | Temp 97.7°F | Ht 69.0 in | Wt 97.4 lb

## 2020-01-08 DIAGNOSIS — R222 Localized swelling, mass and lump, trunk: Secondary | ICD-10-CM | POA: Diagnosis not present

## 2020-01-08 DIAGNOSIS — R634 Abnormal weight loss: Secondary | ICD-10-CM | POA: Diagnosis not present

## 2020-01-08 DIAGNOSIS — H548 Legal blindness, as defined in USA: Secondary | ICD-10-CM

## 2020-01-08 DIAGNOSIS — Z7189 Other specified counseling: Secondary | ICD-10-CM | POA: Diagnosis not present

## 2020-01-08 DIAGNOSIS — Z853 Personal history of malignant neoplasm of breast: Secondary | ICD-10-CM

## 2020-01-08 NOTE — Progress Notes (Signed)
Location:   [facility] of Service:   Clinic  Provider: Najir Roop L. Mariea Clonts, D.O., C.M.D.  Code Status: DNR, MOST Goals of Care:  Advanced Directives 01/08/2020  Does Patient Have a Medical Advance Directive? Yes  Type of Advance Directive Out of facility DNR (pink MOST or yellow form);Healthcare Power of Attorney  Does patient want to make changes to medical advance directive? No - Patient declined  Copy of [place] in Chart? Yes - validated most recent copy scanned in chart (See row information)  Pre-existing out of facility DNR order (yellow form or pink MOST form) -     Chief Complaint  Patient presents with  . Medical Management of Chronic Issues    5 month follow up     HPI: Patient is a 86 y.o. female seen today for medical management of chronic diseases.    Krystal [name] is doing ok.  She reports her vision continuing to decline.  She is needing more assistance gradually to manage her finances and other paperwork.  Her attorney is her POA and helps her at times.  She also has a good friend who helps with groceries and visits with her.    When asked, she admits to a new area under her left arm that she noticed yesterday rubs the arm--felt a twinge.  It's tender on deep palpation.  She is not certain if it's new, but it's not just skin, she says.    She continues to c/o the food being salty and that she does not like it.  She prefers to eat food with her hands in her room due to her vision being so poor.  She does like fresh fruit and veggies.  She's lost just 0.2 lbs since feb.    Past Medical History:  Diagnosis Date  . Anxiety   . Arthritis   . Back pain   . Borderline diabetes   . Compression fracture of lumbar vertebra ([facility]) 2013  . Constipation   . Displaced intertrochanteric fracture of left femur ([facility])   . Fracture of unspecified part of neck of unspecified femur, initial encounter for closed fracture ([facility])   . GERD (gastroesophageal reflux  disease)   . Hyperglycemia   . Hypo-osmolality and hyponatremia   . Macular degeneration    both eyes, blind  . Osteoporosis     Past Surgical History:  Procedure Laterality Date  . CATARACT EXTRACTION    . HIP FRACTURE SURGERY Left 2016  . INTRAMEDULLARY (IM) NAIL INTERTROCHANTERIC Left 03/24/2015   Procedure: INTRAMEDULLARY (IM) NAIL INTERTROCHANTRIC;  Surgeon: Rod Can, MD;  Location: [facility];  Service: Orthopedics;  Laterality: Left;  Marland Kitchen MASTECTOMY WITH AXILLARY LYMPH NODE DISSECTION Left 03/15/2019   Procedure: LEFT TOTAL MASTECTOMY WITH REMOVAL OF INVOLVED LYMPH NODES;  Surgeon: Fanny Skates, MD;  Location: [facility];  Service: General;  Laterality: Left;  . TONSILLECTOMY      No Known Allergies  Outpatient Encounter Medications as of 01/08/2020  Medication Sig  . acetaminophen (TYLENOL) 500 MG tablet Take 1,000 mg by mouth every 6 (six) hours as needed. For pain.  . cholecalciferol (VITAMIN D) 1000 UNITS tablet Take 2,000 Units by mouth daily.  . dorzolamide-timolol (COSOPT) 22.3-6.8 MG/ML ophthalmic solution Place 1 drop into the right eye 2 (two) times daily.  . Multiple Vitamin (MULITIVITAMIN WITH MINERALS) TABS Take 1 tablet by mouth daily.  Marland Kitchen omega-3 acid ethyl esters (LOVAZA) 1 G capsule Take 1 g by mouth daily.  Marland Kitchen  TRAVATAN Z 0.004 % SOLN ophthalmic solution Place 1 drop into the right eye at bedtime.    No facility-administered encounter medications on file as of 01/08/2020.    Review of Systems:  Review of Systems  Constitutional: Positive for weight loss. Negative for chills and fever.  HENT: Negative for hearing loss.   Eyes: Positive for blurred vision.       Struggling now some even with her magnification device she has  Respiratory: Negative for cough and shortness of breath.   Cardiovascular: Negative for chest pain, palpitations and leg swelling.  Gastrointestinal: Negative for abdominal pain, blood in stool, constipation, diarrhea and melena.    Genitourinary: Negative for dysuria.  Musculoskeletal: Negative for falls and joint pain.  Skin: Negative for itching and rash.  Neurological: Negative for dizziness and loss of consciousness.  Endo/Heme/Allergies: Does not bruise/bleed easily.  Psychiatric/Behavioral: Negative for depression and memory loss. The patient is not nervous/anxious and does not have insomnia.     Health Maintenance  Topic Date Due  . URINE MICROALBUMIN  Never done  . TETANUS/TDAP  Never done  . DEXA SCAN  10/25/2023 (Originally 12/17/1998)  . INFLUENZA VACCINE  01/26/2020  . COVID-19 Vaccine  Completed  . PNA vac Low Risk Adult  Completed    Physical Exam: Vitals:   01/08/20 1054  BP: 122/78  Pulse: 93  Temp: 97.7 F (36.5 C)  TempSrc: Temporal  SpO2: 95%  Weight: 97 lb 6.4 oz (44.2 kg)  Height: 5\' 9"  (1.753 m)   Body mass index is 14.38 kg/m. Physical Exam Vitals reviewed.  Constitutional:      General: She is not in acute distress.    Appearance: She is not toxic-appearing.     Comments: Thin female  HENT:     Head: Normocephalic and atraumatic.  Eyes:     Comments: Eyes now deviate laterally, mild erythema and clear drainage from right eye  Cardiovascular:     Rate and Rhythm: Normal rate and regular rhythm.     Pulses: Normal pulses.     Heart sounds: Normal heart sounds.  Pulmonary:     Effort: Pulmonary effort is normal.     Breath sounds: Normal breath sounds. No rales.  Chest:     Comments: Left chest wall midaxillary line there is an approximately 1cm firm, fixed nodule that is tender only with deep palpation, near cicatrix from mastectomy and lymph node dissection Abdominal:     General: Bowel sounds are normal.     Palpations: Abdomen is soft.     Tenderness: There is no abdominal tenderness.  Musculoskeletal:        General: Normal range of motion.     Cervical back: Neck supple.     Right lower leg: No edema.     Left lower leg: No edema.     Comments: Ambulates  with rolling walker  Lymphadenopathy:     Upper Body:     Left upper body: No supraclavicular, axillary or pectoral adenopathy.  Skin:    General: Skin is warm and dry.     Capillary Refill: Capillary refill takes less than 2 seconds.  Neurological:     General: No focal deficit present.     Mental Status: She is alert and oriented to person, place, and time.     Labs reviewed: Basic Metabolic Panel: Recent Labs    03/15/19 0543 04/26/19 0000 01/02/20 0000  NA 133* 138 134*  K 3.4* 3.9 4.1  CL 96*  --  94*  CO2 28  --  22  GLUCOSE 101*  --   --   BUN 12 18 13  12   CREATININE 0.52 0.4* 0.4*  CALCIUM 9.1  --  9.9   Liver Function Tests: Recent Labs    03/15/19 0543 04/26/19 0000 01/02/20 0000  AST 19 12* 16  ALT 12 9 10   ALKPHOS 70 79  --   BILITOT 0.9  --   --   PROT 7.1  --   --   ALBUMIN 3.6  --  4.5   No results for input(s): LIPASE, AMYLASE in the last 8760 hours. No results for input(s): AMMONIA in the last 8760 hours. CBC: Recent Labs    03/15/19 0543 04/26/19 0000 01/02/20 0000  WBC 5.9 4.3 5.6  NEUTROABS 3.9  --   --   HGB 13.1 13.2 15.2  HCT 38.7 37 45  MCV 96.0  --   --   PLT 215 197 205   Lipid Panel: Recent Labs    04/26/19 0000  CHOL 159  HDL 67  LDLCALC 82  TRIG 49   Lab Results  Component Value Date   HGBA1C 6.2 01/02/2020    Procedures since last visit: No results found.  Assessment/Plan 1. History of breast cancer -s/p mastectomy and lymph node dissection in September of last year -she had declined any further treatments -at this time, there is a new nodule suspicious for malignancy - see ACP discussion below -pt has f/u with surgery (new physician since Dr. Dalbert Batman retired) at [facility] on 7/26 at 9:10am with Dr. Donne Hazel  3. Legal blindness -she does not want to die fully dependent on others and she knows that's where things are headed if something else   4. Weight loss -wt down just slightly from last visit -intake  has not changed recently  5. Nodule of left anterior chest wall -really midaxillary line, about 1cm -suspect recurrent malignancy  6. ACP (advance care planning) -we discussed today--at present, this is not bothersome to her and she does not want surgery again unless it is purely to help her comfort -she also does not want any medications or radiation -she does not want to go through going out to unfamiliar places with her blindness and does not want [place] doing things to her to prolong her life due to her poor vision and not wanting to die fully dependent on others due to blindness -21 mins spent on ACP  Labs/tests ordered:  No new Next appt:  01/08/2020  Krystal [name] L. Jocelynne Duquette, D.O. [facility] [address], [place] [address] Cell Phone (Mon-Fri 8am-5pm):  [phone] On Call:  [phone] & follow prompts after 5pm & weekends Office Phone:  [phone] Office Fax:  [phone]

## 2020-01-20 ENCOUNTER — Other Ambulatory Visit: Payer: Self-pay | Admitting: General Surgery

## 2020-01-20 DIAGNOSIS — L309 Dermatitis, unspecified: Secondary | ICD-10-CM | POA: Diagnosis not present

## 2020-01-20 DIAGNOSIS — Z853 Personal history of malignant neoplasm of breast: Secondary | ICD-10-CM | POA: Diagnosis not present

## 2020-01-20 DIAGNOSIS — C50912 Malignant neoplasm of unspecified site of left female breast: Secondary | ICD-10-CM | POA: Diagnosis not present

## 2020-01-20 DIAGNOSIS — R222 Localized swelling, mass and lump, trunk: Secondary | ICD-10-CM | POA: Diagnosis not present

## 2020-01-29 ENCOUNTER — Other Ambulatory Visit: Payer: Self-pay | Admitting: General Surgery

## 2020-01-29 DIAGNOSIS — R222 Localized swelling, mass and lump, trunk: Secondary | ICD-10-CM

## 2020-01-30 DIAGNOSIS — H31012 Macula scars of posterior pole (postinflammatory) (post-traumatic), left eye: Secondary | ICD-10-CM | POA: Diagnosis not present

## 2020-01-30 DIAGNOSIS — H401114 Primary open-angle glaucoma, right eye, indeterminate stage: Secondary | ICD-10-CM | POA: Diagnosis not present

## 2020-01-30 DIAGNOSIS — H353211 Exudative age-related macular degeneration, right eye, with active choroidal neovascularization: Secondary | ICD-10-CM | POA: Diagnosis not present

## 2020-02-07 ENCOUNTER — Ambulatory Visit
Admission: RE | Admit: 2020-02-07 | Discharge: 2020-02-07 | Disposition: A | Discharge status: Home / Self Care | Payer: Medicare Other | Source: Ambulatory Visit | Attending: General Surgery | Admitting: General Surgery

## 2020-02-07 ENCOUNTER — Other Ambulatory Visit: Payer: Self-pay

## 2020-02-07 DIAGNOSIS — N6320 Unspecified lump in the left breast, unspecified quadrant: Secondary | ICD-10-CM | POA: Diagnosis not present

## 2020-02-07 DIAGNOSIS — R222 Localized swelling, mass and lump, trunk: Secondary | ICD-10-CM

## 2020-02-07 DIAGNOSIS — Z86000 Personal history of in-situ neoplasm of breast: Secondary | ICD-10-CM | POA: Diagnosis not present

## 2020-02-07 DIAGNOSIS — D0512 Intraductal carcinoma in situ of left breast: Secondary | ICD-10-CM | POA: Diagnosis not present

## 2020-02-07 DIAGNOSIS — C50912 Malignant neoplasm of unspecified site of left female breast: Secondary | ICD-10-CM | POA: Diagnosis not present

## 2020-02-10 DIAGNOSIS — Z20828 Contact with and (suspected) exposure to other viral communicable diseases: Secondary | ICD-10-CM | POA: Diagnosis not present

## 2020-02-10 DIAGNOSIS — Z9189 Other specified personal risk factors, not elsewhere classified: Secondary | ICD-10-CM | POA: Diagnosis not present

## 2020-02-17 DIAGNOSIS — Z20828 Contact with and (suspected) exposure to other viral communicable diseases: Secondary | ICD-10-CM | POA: Diagnosis not present

## 2020-02-17 DIAGNOSIS — Z9189 Other specified personal risk factors, not elsewhere classified: Secondary | ICD-10-CM | POA: Diagnosis not present

## 2020-02-28 ENCOUNTER — Other Ambulatory Visit: Payer: Self-pay | Admitting: General Surgery

## 2020-02-28 DIAGNOSIS — C50912 Malignant neoplasm of unspecified site of left female breast: Secondary | ICD-10-CM | POA: Diagnosis not present

## 2020-03-04 ENCOUNTER — Non-Acute Institutional Stay: Payer: Medicare Other | Admitting: Internal Medicine

## 2020-03-04 DIAGNOSIS — C50912 Malignant neoplasm of unspecified site of left female breast: Secondary | ICD-10-CM | POA: Diagnosis not present

## 2020-03-04 DIAGNOSIS — R222 Localized swelling, mass and lump, trunk: Secondary | ICD-10-CM | POA: Diagnosis not present

## 2020-03-04 DIAGNOSIS — H548 Legal blindness, as defined in USA: Secondary | ICD-10-CM

## 2020-03-04 NOTE — Progress Notes (Signed)
Location:  Occupational psychologist of Service:  ALF (13) Provider:  Shantale Holtmeyer L. Mariea Clonts, D.O., C.M.D.  Gayland Curry, DO  Patient Care Team: Gayland Curry, DO as PCP - General (Geriatric Medicine) Gerarda Fraction, MD as Referring Physician (Ophthalmology)  Extended Emergency Contact Information Primary Emergency Contact: Andrez Grime, [place] [address] [address] of [place] Phone: [phone] Relation: Neighbor  Code Status:  DNR Goals of care: Advanced Directive information Advanced Directives 03/06/2020  Does Patient Have a Medical Advance Directive? Yes  Type of Paramedic of [place];Living will  Does patient want to make changes to medical advance directive? -  Copy of [place] in Chart? -  Pre-existing out of facility DNR order (yellow form or pink MOST form) -     Chief Complaint  Patient presents with  . Acute Visit    discuss surgery for recurrent breast cancer    HPI:  Pt is a 86 y.o. female seen today for an acute visit for discussing surgery for recurrent breast cancer.  I had noted that Krystal [name] had a new lump on her chest wall at her last appt with me.  She has been reluctant to have any treatments for breast cancer because she does not want her life prolonged due to her blindness.  However, when the second biopsy returned, it was determined that wide excision of the mass was the best option to prevent spread into her rib and increased pain and complications.  She is agreeable to this, but wanted to be sure I agreed and concurred with Dr. Cristal Generous recommendations.  We discussed that I do and it is whatever keeps her quality of life as good as it can be.  She also wanted to be sure that we return her directly to her AL apt after her surgery at the surgical center.  She reminds me that she will not tolerate a stay in rehab well with her limited sight and it will disorient her terribly.  She  is requiring more assistance to do her paperwork and she really cannot stand losing her independence like this.    Past Medical History:  Diagnosis Date  . Anxiety   . Arthritis   . Back pain   . Borderline diabetes   . Compression fracture of lumbar vertebra ([place]) 2013  . Constipation   . Displaced intertrochanteric fracture of left femur ([place])   . Fracture of unspecified part of neck of unspecified femur, initial encounter for closed fracture ([place])   . GERD (gastroesophageal reflux disease)   . Hyperglycemia   . Hypo-osmolality and hyponatremia   . Macular degeneration    both eyes, blind  . Osteoporosis   . Recurrent breast cancer, left [facility])    Past Surgical History:  Procedure Laterality Date  . CATARACT EXTRACTION    . HIP FRACTURE SURGERY Left 2016  . INTRAMEDULLARY (IM) NAIL INTERTROCHANTERIC Left 03/24/2015   Procedure: INTRAMEDULLARY (IM) NAIL INTERTROCHANTRIC;  Surgeon: Rod Can, MD;  Location: [place];  Service: Orthopedics;  Laterality: Left;  Marland Kitchen MASTECTOMY WITH AXILLARY LYMPH NODE DISSECTION Left 03/15/2019   Procedure: LEFT TOTAL MASTECTOMY WITH REMOVAL OF INVOLVED LYMPH NODES;  Surgeon: Fanny Skates, MD;  Location: [place];  Service: General;  Laterality: Left;  . TONSILLECTOMY      No Known Allergies  Outpatient Encounter Medications as of 03/04/2020  Medication Sig  . acetaminophen (TYLENOL) 500 MG tablet Take  1,000 mg by mouth every 6 (six) hours as needed. For pain.  . cholecalciferol (VITAMIN D) 1000 UNITS tablet Take 2,000 Units by mouth daily.  . dorzolamide-timolol (COSOPT) 22.3-6.8 MG/ML ophthalmic solution Place 1 drop into the right eye 2 (two) times daily.  . Multiple Vitamin (MULITIVITAMIN WITH MINERALS) TABS Take 1 tablet by mouth daily.  Marland Kitchen omega-3 acid ethyl esters (LOVAZA) 1 G capsule Take 1 g by mouth daily.  . TRAVATAN Z 0.004 % SOLN ophthalmic solution Place 1 drop into the right eye at bedtime.    No facility-administered encounter  medications on file as of 03/04/2020.    Review of Systems  Constitutional: Negative for chills and fever.  HENT: Negative for hearing loss.   Eyes:       Blindness  Respiratory: Negative for cough and shortness of breath.   Cardiovascular: Negative for chest pain.  Gastrointestinal: Negative for abdominal pain and constipation.  Genitourinary: Negative for dysuria.  Musculoskeletal: Negative for back pain, falls and joint pain.  Neurological: Negative for dizziness, loss of consciousness and weakness.  Psychiatric/Behavioral: Negative for depression and memory loss. The patient is not nervous/anxious and does not have insomnia.     Immunization History  Administered Date(s) Administered  . Influenza Inj Mdck Quad Pf 04/14/2016  . Influenza Split 04/08/2013  . Influenza,inj,Quad PF,6+ Mos 03/26/2015, 04/17/2018  . Influenza-Unspecified 04/08/2014, 04/14/2016, 04/17/2017, 04/24/2019  . Moderna SARS-COVID-2 Vaccination 07/08/2019, 08/06/2019  . Pneumococcal Conjugate-13 06/06/2014  . Pneumococcal Polysaccharide-23 05/02/2018  . Zoster 07/09/2012  . Zoster Recombinat (Shingrix) 07/21/2017, 10/03/2017   Pertinent  Health Maintenance Due  Topic Date Due  . URINE MICROALBUMIN  Never done  . INFLUENZA VACCINE  01/26/2020  . DEXA SCAN  10/25/2023 (Originally 12/17/1998)  . PNA vac Low Risk Adult  Completed   Fall Risk  01/08/2020 07/31/2019 05/01/2019 01/21/2019 10/31/2018  Falls in the past year? 0 0 0 0 0  Number falls in past yr: 0 0 0 0 0  Injury with Fall? 0 - 0 0 0  Risk for fall due to : - - - Impaired balance/gait;Impaired vision -  Follow up Falls evaluation completed - - Falls evaluation completed;Falls prevention discussed -   Functional Status Survey:    Vitals:   03/04/20 1641  BP: 124/80  Pulse: 90  Resp: 16  Temp: (!) 97 F (36.1 C)  SpO2: 97%  Weight: 98 lb (44.5 kg)  Height: 5' 10"  (1.778 m)   Body mass index is 14.06 kg/m. Physical Exam Vitals reviewed.    Constitutional:      General: She is not in acute distress.    Appearance: She is not toxic-appearing.     Comments: Thin female seated at first on sofa, but promptly insisted I sit there while she sat in her well cushioned wheelchair  HENT:     Head: Normocephalic and atraumatic.     Right Ear: External ear normal.     Left Ear: External ear normal.  Eyes:     Comments: Legally blind  Cardiovascular:     Rate and Rhythm: Normal rate.  Pulmonary:     Effort: Pulmonary effort is normal.  Chest:     Comments: Nodule left chest wall near prior scar from mastectomy Abdominal:     General: Bowel sounds are normal.  Skin:    General: Skin is warm and dry.     Coloration: Skin is pale.  Neurological:     General: No focal deficit present.  Mental Status: She is alert and oriented to person, place, and time.  Psychiatric:        Mood and Affect: Mood normal.     Labs reviewed: Recent Labs    03/15/19 0543 04/26/19 0000 01/02/20 0000  NA 133* 138 134*  K 3.4* 3.9 4.1  CL 96*  --  94*  CO2 28  --  22  GLUCOSE 101*  --   --   BUN 12 18 13  12   CREATININE 0.52 0.4* 0.4*  CALCIUM 9.1  --  9.9   Recent Labs    03/15/19 0543 04/26/19 0000 01/02/20 0000  AST 19 12* 16  ALT 12 9 10   ALKPHOS 70 79  --   BILITOT 0.9  --   --   PROT 7.1  --   --   ALBUMIN 3.6  --  4.5   Recent Labs    03/15/19 0543 04/26/19 0000 01/02/20 0000  WBC 5.9 4.3 5.6  NEUTROABS 3.9  --   --   HGB 13.1 13.2 15.2  HCT 38.7 37 45  MCV 96.0  --   --   PLT 215 197 205   No results found for: TSH Lab Results  Component Value Date   HGBA1C 6.2 01/02/2020   Lab Results  Component Value Date   CHOL 159 04/26/2019   HDL 67 04/26/2019   LDLCALC 82 04/26/2019   TRIG 49 04/26/2019    Significant Diagnostic Results in last 30 days:  US BREAST LTD UNI LEFT INC AXILLA  Result Date: 02/07/2020 CLINICAL DATA:  86 year old who underwent malignant LEFT mastectomy in September, 2020, for  grade 3 invasive ductal carcinoma and high-grade DCIS with lymphovascular invasion and skin invasion with ulceration. She presents now with a palpable lump at the LATERAL aspect of the mastectomy site. Recent punch biopsy of the overlying skin revealed dermatitis. Diagnostic ultrasound is performed in anticipation of possible biopsy. EXAM: ULTRASOUND OF THE LEFT BREAST/MASTECTOMY SITE COMPARISON:  Previous exam(s). FINDINGS: On physical exam, there is a visible and palpable approximate 1-2 cm mass in the LATERAL aspect of the mastectomy site. Targeted ultrasound is performed, showing an irregular hypoechoic mass in the subcutaneous tissues of the mastectomy site measuring approximately 1.3 x 0.9 x 1.1 cm, demonstrating posterior acoustic enhancement and peripheral power Doppler flow, corresponding to the palpable concern. IMPRESSION: Highly suspicious 1.3 cm mass in the subcutaneous tissues of the mastectomy site. RECOMMENDATION: Ultrasound-guided core needle biopsy of the highly suspicious mass. The biopsy was performed subsequently and is reported separately. I have discussed the findings and recommendations with the patient. BI-RADS CATEGORY  5: Highly suggestive of malignancy. Electronically Signed   By: Evangeline Dakin M.D.   On: 02/07/2020 14:54   [place] LT BREAST BX W LOC DEV 1ST LESION IMG BX SPEC US GUIDE  Addendum Date: 02/17/2020   ADDENDUM REPORT: 02/10/2020 14:45 ADDENDUM: Pathology revealed GRADE III INVASIVE DUCTAL CARCINOMA of the lateral aspect of the mastectomy site. This was found to be concordant by Dr. Peggye Fothergill. Pathology results were discussed with the patient by telephone and reported to [place], RN-per patient request. The patient reported doing well after the biopsy with tenderness at the site. Post biopsy instructions and care were reviewed and questions were answered. The patient was encouraged to call [facility] for any additional  concerns. Follow up surgical consultation was scheduled with Dr. Rolm Bookbinder at [facility] on February 17, 2020. Nursing  Supervisor Candida Peeling, RN reported this appointment will be rescheduled at appropriate time. Pathology results reported by Stacie Acres RN on 02/10/2020. Electronically Signed   By: Evangeline Dakin M.D.   On: 02/10/2020 14:45   Result Date: 02/17/2020 CLINICAL DATA:  86 year old underwent malignant LEFT mastectomy 02/2019, grade 3 IDC and high-grade DCIS with lymphovascular invasion and skin invasion with ulceration. Now with a 1.3 cm mass at the LATERAL aspect of the mastectomy site. Punch biopsy 01/20/2020 = dermatitis. EXAM: ULTRASOUND GUIDED LEFT BREAST/MASTECTOMY SITE CORE NEEDLE BIOPSY COMPARISON:  Previous exam(s). PROCEDURE: I met with the patient and we discussed the procedure of ultrasound-guided biopsy, including benefits and alternatives. We discussed the high likelihood of a successful procedure. We discussed the risks of the procedure, including infection, bleeding, tissue injury, clip migration, and inadequate sampling. Informed written consent was given. The usual time-out protocol was performed immediately prior to the procedure. Lesion quadrant: LATERAL aspect of the mastectomy site. Using sterile technique with chlorhexidine as skin antisepsis, 1% Lidocaine as local anesthetic, under direct ultrasound visualization, a 14 gauge Bard Marquee core needle device was used to perform biopsy of the subcutaneous mass involving the LEFT mastectomy site using a medial approach. At the conclusion of the procedure a ribbon shaped tissue marker clip was deployed into the biopsy cavity. IMPRESSION: Ultrasound guided biopsy of a subcutaneous mass involving the LEFT breast mastectomy site. No apparent complications. Electronically Signed: By: Evangeline Dakin M.D. On: 02/07/2020 15:06    Assessment/Plan 1. Nodule of left anterior chest wall -Katarzyna has decided she will  have this removed by Dr. Donne Hazel and I'm in agreement with her plan -goal is to return to her apt in AL and have any dressing changes done in this environment where she is familiar  2. Recurrent malignant neoplasm of left breast ([place]) -was noted when biopsy obtained and hormone negative so no other options really available for treatment that she would be open to or tolerate   3. Legal blindness -progressing and making her more dependent on others and less happy each day, unfortunately -we will do our best to keep her quality of life optimal and keep her in her AL apt with support  Family/ staff Communication: discussed with nurse manager  Labs/tests ordered:  none  Keundra Petrucelli L. Jaremy Nosal, D.O. [facility] [address], [place] [address] Cell Phone (Mon-Fri 8am-5pm):  [phone] On Call:  [phone] & follow prompts after 5pm & weekends Office Phone:  [phone] Office Fax:  [phone]

## 2020-03-06 ENCOUNTER — Other Ambulatory Visit: Payer: Self-pay

## 2020-03-06 ENCOUNTER — Encounter (HOSPITAL_BASED_OUTPATIENT_CLINIC_OR_DEPARTMENT_OTHER): Payer: Self-pay | Admitting: General Surgery

## 2020-03-06 ENCOUNTER — Encounter: Payer: Self-pay | Admitting: Internal Medicine

## 2020-03-09 ENCOUNTER — Other Ambulatory Visit (HOSPITAL_COMMUNITY)
Admission: RE | Admit: 2020-03-09 | Discharge: 2020-03-09 | Disposition: A | Discharge status: Home / Self Care | Payer: Medicare Other | Source: Ambulatory Visit | Attending: General Surgery | Admitting: General Surgery

## 2020-03-09 ENCOUNTER — Encounter (HOSPITAL_BASED_OUTPATIENT_CLINIC_OR_DEPARTMENT_OTHER): Payer: Self-pay | Admitting: General Surgery

## 2020-03-09 DIAGNOSIS — Z01812 Encounter for preprocedural laboratory examination: Secondary | ICD-10-CM | POA: Insufficient documentation

## 2020-03-09 DIAGNOSIS — Z20822 Contact with and (suspected) exposure to covid-19: Secondary | ICD-10-CM | POA: Insufficient documentation

## 2020-03-09 NOTE — Progress Notes (Signed)
I spoke with the desk nurse at [facility] today and she states that Ms Krystal [name] will come back to [facility] after surgery and go to their rehab center for 24 hrs after having her surgery. Someone from  their [facility] program will come with pt for surgery and stay to return her home.

## 2020-03-10 LAB — SARS CORONAVIRUS 2 (TAT 6-24 HRS): SARS Coronavirus 2: NEGATIVE

## 2020-03-11 ENCOUNTER — Encounter (HOSPITAL_BASED_OUTPATIENT_CLINIC_OR_DEPARTMENT_OTHER)
Admission: RE | Disposition: A | Discharge status: Home / Self Care | Payer: Self-pay | Source: Home / Self Care | Attending: General Surgery

## 2020-03-11 ENCOUNTER — Ambulatory Visit (HOSPITAL_BASED_OUTPATIENT_CLINIC_OR_DEPARTMENT_OTHER): Payer: Medicare Other | Admitting: Anesthesiology

## 2020-03-11 ENCOUNTER — Ambulatory Visit (HOSPITAL_BASED_OUTPATIENT_CLINIC_OR_DEPARTMENT_OTHER)
Admission: RE | Admit: 2020-03-11 | Discharge: 2020-03-11 | Disposition: A | Discharge status: Home / Self Care | Payer: Medicare Other | Attending: General Surgery | Admitting: General Surgery

## 2020-03-11 ENCOUNTER — Encounter (HOSPITAL_BASED_OUTPATIENT_CLINIC_OR_DEPARTMENT_OTHER): Payer: Self-pay | Admitting: General Surgery

## 2020-03-11 DIAGNOSIS — H547 Unspecified visual loss: Secondary | ICD-10-CM | POA: Diagnosis not present

## 2020-03-11 DIAGNOSIS — Z66 Do not resuscitate: Secondary | ICD-10-CM | POA: Diagnosis not present

## 2020-03-11 DIAGNOSIS — Z171 Estrogen receptor negative status [ER-]: Secondary | ICD-10-CM | POA: Diagnosis not present

## 2020-03-11 DIAGNOSIS — Z87891 Personal history of nicotine dependence: Secondary | ICD-10-CM | POA: Diagnosis not present

## 2020-03-11 DIAGNOSIS — C50912 Malignant neoplasm of unspecified site of left female breast: Secondary | ICD-10-CM | POA: Diagnosis not present

## 2020-03-11 DIAGNOSIS — E871 Hypo-osmolality and hyponatremia: Secondary | ICD-10-CM | POA: Diagnosis not present

## 2020-03-11 DIAGNOSIS — R2232 Localized swelling, mass and lump, left upper limb: Secondary | ICD-10-CM | POA: Diagnosis not present

## 2020-03-11 DIAGNOSIS — C50512 Malignant neoplasm of lower-outer quadrant of left female breast: Secondary | ICD-10-CM | POA: Diagnosis not present

## 2020-03-11 HISTORY — PX: MASS EXCISION: SHX2000

## 2020-03-11 HISTORY — DX: Legal blindness, as defined in USA: H54.8

## 2020-03-11 HISTORY — DX: Malignant neoplasm of unspecified site of left female breast: C50.912

## 2020-03-11 SURGERY — EXCISION MASS
Anesthesia: Monitor Anesthesia Care | Site: Breast | Laterality: Left

## 2020-03-11 MED ORDER — ACETAMINOPHEN 500 MG PO TABS
1000.0000 mg | ORAL_TABLET | ORAL | Status: AC
  Administered 2020-03-11: 650 mg via ORAL

## 2020-03-11 MED ORDER — LIDOCAINE-EPINEPHRINE (PF) 1 %-1:200000 IJ SOLN
INTRAMUSCULAR | Status: AC
  Filled 2020-03-11: qty 30

## 2020-03-11 MED ORDER — BACITRACIN ZINC 500 UNIT/GM EX OINT
TOPICAL_OINTMENT | CUTANEOUS | Status: AC
  Filled 2020-03-11: qty 0.9

## 2020-03-11 MED ORDER — ONDANSETRON HCL 4 MG/2ML IJ SOLN
INTRAMUSCULAR | Status: DC | PRN
  Administered 2020-03-11: 4 mg via INTRAVENOUS

## 2020-03-11 MED ORDER — FENTANYL CITRATE (PF) 100 MCG/2ML IJ SOLN
INTRAMUSCULAR | Status: DC | PRN
Start: 2020-03-11 — End: 2020-03-11
  Administered 2020-03-11: 50 ug via INTRAVENOUS

## 2020-03-11 MED ORDER — PROPOFOL 500 MG/50ML IV EMUL
INTRAVENOUS | Status: DC | PRN
  Administered 2020-03-11: 50 ug/kg/min via INTRAVENOUS
  Administered 2020-03-11: 20 mg via INTRAVENOUS

## 2020-03-11 MED ORDER — LIDOCAINE-EPINEPHRINE 1 %-1:100000 IJ SOLN
INTRAMUSCULAR | Status: AC
  Filled 2020-03-11: qty 1

## 2020-03-11 MED ORDER — ONDANSETRON HCL 4 MG/2ML IJ SOLN: 4.0000 mg | Freq: Once | INTRAMUSCULAR | Status: DC | PRN

## 2020-03-11 MED ORDER — FENTANYL CITRATE (PF) 100 MCG/2ML IJ SOLN: 25.0000 ug | INTRAMUSCULAR | Status: DC | PRN

## 2020-03-11 MED ORDER — CEFAZOLIN SODIUM-DEXTROSE 2-4 GM/100ML-% IV SOLN
2.0000 g | INTRAVENOUS | Status: AC
  Administered 2020-03-11: 1 g via INTRAVENOUS

## 2020-03-11 MED ORDER — OXYCODONE HCL 5 MG PO TABS: 5.0000 mg | ORAL_TABLET | Freq: Once | ORAL | Status: DC | PRN

## 2020-03-11 MED ORDER — ONDANSETRON HCL 4 MG/2ML IJ SOLN
INTRAMUSCULAR | Status: AC
  Filled 2020-03-11: qty 2

## 2020-03-11 MED ORDER — BACITRACIN ZINC 500 UNIT/GM EX OINT
TOPICAL_OINTMENT | CUTANEOUS | Status: AC
  Filled 2020-03-11: qty 28.35

## 2020-03-11 MED ORDER — LACTATED RINGERS IV SOLN: INTRAVENOUS | Status: DC

## 2020-03-11 MED ORDER — ACETAMINOPHEN 500 MG PO TABS
ORAL_TABLET | ORAL | Status: AC
  Filled 2020-03-11: qty 2

## 2020-03-11 MED ORDER — BACITRACIN ZINC 500 UNIT/GM EX OINT
TOPICAL_OINTMENT | CUTANEOUS | Status: DC | PRN
  Administered 2020-03-11: 1 via TOPICAL

## 2020-03-11 MED ORDER — OXYCODONE HCL 5 MG/5ML PO SOLN: 5.0000 mg | Freq: Once | ORAL | Status: DC | PRN

## 2020-03-11 MED ORDER — BUPIVACAINE HCL (PF) 0.25 % IJ SOLN
INTRAMUSCULAR | Status: DC | PRN
  Administered 2020-03-11: 7.5 mL

## 2020-03-11 MED ORDER — ACETAMINOPHEN 325 MG PO TABS
ORAL_TABLET | ORAL | Status: AC
  Filled 2020-03-11: qty 2

## 2020-03-11 MED ORDER — LIDOCAINE-EPINEPHRINE (PF) 1 %-1:200000 IJ SOLN
INTRAMUSCULAR | Status: DC | PRN
  Administered 2020-03-11: 7.5 mL

## 2020-03-11 MED ORDER — FENTANYL CITRATE (PF) 100 MCG/2ML IJ SOLN
INTRAMUSCULAR | Status: AC
  Filled 2020-03-11: qty 2

## 2020-03-11 SURGICAL SUPPLY — 53 items
ADH SKN CLS APL DERMABOND .7 (GAUZE/BANDAGES/DRESSINGS) ×1
APL PRP STRL LF DISP 70% ISPRP (MISCELLANEOUS) ×1
BINDER BREAST MEDIUM (GAUZE/BANDAGES/DRESSINGS) ×2 IMPLANT
BLADE CLIPPER SURG (BLADE) IMPLANT
BLADE SURG 15 STRL LF DISP TIS (BLADE) ×1 IMPLANT
BLADE SURG 15 STRL SS (BLADE) ×3
CANISTER SUCT 1200ML W/VALVE (MISCELLANEOUS) IMPLANT
CHLORAPREP W/TINT 26 (MISCELLANEOUS) ×3 IMPLANT
CLOSURE STERI-STRIP 1/2X4 (GAUZE/BANDAGES/DRESSINGS)
CLSR STERI-STRIP ANTIMIC 1/2X4 (GAUZE/BANDAGES/DRESSINGS) ×1 IMPLANT
COVER BACK TABLE 60X90IN (DRAPES) ×3 IMPLANT
COVER MAYO STAND STRL (DRAPES) ×3 IMPLANT
COVER WAND RF STERILE (DRAPES) IMPLANT
DECANTER SPIKE VIAL GLASS SM (MISCELLANEOUS) IMPLANT
DERMABOND ADVANCED (GAUZE/BANDAGES/DRESSINGS) ×2
DERMABOND ADVANCED .7 DNX12 (GAUZE/BANDAGES/DRESSINGS) ×1 IMPLANT
DRAPE LAPAROTOMY 100X72 PEDS (DRAPES) ×3 IMPLANT
DRAPE UTILITY XL STRL (DRAPES) ×3 IMPLANT
DRSG TEGADERM 4X4.75 (GAUZE/BANDAGES/DRESSINGS) IMPLANT
ELECT COATED BLADE 2.86 ST (ELECTRODE) ×2 IMPLANT
ELECT REM PT RETURN 9FT ADLT (ELECTROSURGICAL) ×3
ELECTRODE REM PT RTRN 9FT ADLT (ELECTROSURGICAL) ×1 IMPLANT
GAUZE PACKING IODOFORM 1/4X15 (PACKING) IMPLANT
GAUZE SPONGE 4X4 12PLY STRL LF (GAUZE/BANDAGES/DRESSINGS) ×2 IMPLANT
GLOVE BIO SURGEON STRL SZ7 (GLOVE) ×3 IMPLANT
GLOVE BIOGEL PI IND STRL 7.5 (GLOVE) ×1 IMPLANT
GLOVE BIOGEL PI INDICATOR 7.5 (GLOVE) ×2
GOWN STRL REUS W/ TWL LRG LVL3 (GOWN DISPOSABLE) ×3 IMPLANT
GOWN STRL REUS W/TWL LRG LVL3 (GOWN DISPOSABLE) ×6
KIT MARKER MARGIN INK (KITS) IMPLANT
NDL HYPO 25X1 1.5 SAFETY (NEEDLE) ×1 IMPLANT
NEEDLE HYPO 25X1 1.5 SAFETY (NEEDLE) ×3 IMPLANT
NS IRRIG 1000ML POUR BTL (IV SOLUTION) IMPLANT
PACK BASIN DAY SURGERY FS (CUSTOM PROCEDURE TRAY) ×3 IMPLANT
PENCIL SMOKE EVACUATOR (MISCELLANEOUS) ×3 IMPLANT
SLEEVE SCD COMPRESS KNEE MED (MISCELLANEOUS) ×1 IMPLANT
SPONGE LAP 4X18 RFD (DISPOSABLE) ×1 IMPLANT
SUT ETHILON 2 0 FS 18 (SUTURE) IMPLANT
SUT ETHILON 3 0 PS 1 (SUTURE) ×8 IMPLANT
SUT MNCRL AB 4-0 PS2 18 (SUTURE) ×3 IMPLANT
SUT SILK 2 0 SH (SUTURE) ×2 IMPLANT
SUT VIC AB 2-0 SH 27 (SUTURE) ×9
SUT VIC AB 2-0 SH 27XBRD (SUTURE) IMPLANT
SUT VICRYL 3-0 CR8 SH (SUTURE) IMPLANT
SUT VICRYL 4-0 PS2 18IN ABS (SUTURE) IMPLANT
SWAB COLLECTION DEVICE MRSA (MISCELLANEOUS) IMPLANT
SWAB CULTURE ESWAB REG 1ML (MISCELLANEOUS) IMPLANT
SYR CONTROL 10ML LL (SYRINGE) ×3 IMPLANT
TOWEL GREEN STERILE FF (TOWEL DISPOSABLE) ×3 IMPLANT
TUBE CONNECTING 20'X1/4 (TUBING) ×1
TUBE CONNECTING 20X1/4 (TUBING) ×1 IMPLANT
UNDERPAD 30X36 HEAVY ABSORB (UNDERPADS AND DIAPERS) ×2 IMPLANT
YANKAUER SUCT BULB TIP NO VENT (SUCTIONS) ×2 IMPLANT

## 2020-03-11 NOTE — Brief Op Note (Signed)
03/11/2020  12:43 PM  PATIENT:  Krystal [name]  86 y.o. female  PRE-OPERATIVE DIAGNOSIS:  RECURRENT LEFT BREAST CANCER  POST-OPERATIVE DIAGNOSIS:  RECURRENT LEFT BREAST CANCER  PROCEDURE:  Procedure(s) with comments: EXCISION CHEST WALL MASS (Left) - LOCAL  SURGEON:  Surgeon(s) and Role:    * Rolm Bookbinder, MD - Primary  PHYSICIAN ASSISTANT:   ASSISTANTS: none   ANESTHESIA:   local and MAC   EBL:  minimal  BLOOD ADMINISTERED:none  DRAINS: none   LOCAL MEDICATIONS USED:  MARCAINE    and LIDOCAINE   SPECIMEN:  Excision  DISPOSITION OF SPECIMEN:  PATHOLOGY  COUNTS:  Correct   TOURNIQUET:  * No tourniquets in log *  DICTATION: .Dragon Dictation  PLAN OF CARE: Discharge to home after PACU  PATIENT DISPOSITION:  PACU - hemodynamically stable.   Delay start of Pharmacological VTE agent (>24hrs) due to surgical blood loss or risk of bleeding: not applicable

## 2020-03-11 NOTE — Discharge Instructions (Signed)
[place] Office Phone Number [phone]  POST OP INSTRUCTIONS Take 200 mg of ibuprofen every 8 hours or 650 mg tylenol every 6 hours for next 72 hours then as needed. Use ice several times daily also. Always review your discharge instruction sheet given to you by the facility where your surgery was performed.  IF YOU HAVE DISABILITY OR FAMILY LEAVE FORMS, YOU MUST BRING THEM TO THE OFFICE FOR PROCESSING.  DO NOT GIVE THEM TO YOUR DOCTOR.  1. A prescription for pain medication may be given to you upon discharge.  Take your pain medication as prescribed, if needed.  If narcotic pain medicine is not needed, then you may take acetaminophen (Tylenol), naprosyn (Alleve) or ibuprofen (Advil) as needed. 2. Take your usually prescribed medications unless otherwise directed 3. If you need a refill on your pain medication, please contact your pharmacy.  They will contact our office to request authorization.  Prescriptions will not be filled after 5pm or on week-ends. 4. You should eat very light the first 24 hours after surgery, such as soup, crackers, pudding, etc.  Resume your normal diet the day after surgery. 5. Most patients will experience some swelling and bruising.  Ice packs and the binder will help.  Wear  binder provided for 72 hours day and night and then wear for comfort. Swelling and bruising can take several days to resolve.  6. It is common to experience some constipation if taking pain medication after surgery.  Increasing fluid intake and taking a stool softener will usually help or prevent this problem from occurring.  A mild laxative (Milk of Magnesia or Miralax) should be taken according to package directions if there are no bowel movements after 48 hours. 7. Sutures will be removed in two weeks. Leave dressing in place 48 hours then may remove.  May shower then.  Cover incision during the day after that. 8. ACTIVITIES:  You may resume regular daily activities  (gradually increasing) beginning the next day.  9. You should see your doctor in the office for a follow-up appointment approximately two weeks after your surgery.  Your doctor's nurse will typically make your follow-up appointment when she calls you with your pathology report.  Expect your pathology report 3-4 business days after your surgery.  You may call to check if you do not hear from [place] after three days. 10. OTHER INSTRUCTIONS: _______________________________________________________________________________________________ _____________________________________________________________________________________________________________________________________ _____________________________________________________________________________________________________________________________________ _____________________________________________________________________________________________________________________________________  WHEN TO CALL DR Margree Gimbel: 1. Fever over 101.0 2. Nausea and/or vomiting. 3. Extreme swelling or bruising. 4. Continued bleeding from incision. 5. Increased pain, redness, or drainage from the incision.  The clinic staff is available to answer your questions during regular business hours.  Please don't hesitate to call and ask to speak to one of the nurses for clinical concerns.  If you have a medical emergency, go to the nearest emergency room or call 911.  A surgeon from [facility] is always on call at the hospital.  For further questions, please visit [email or web address]

## 2020-03-11 NOTE — Anesthesia Preprocedure Evaluation (Signed)
Anesthesia Evaluation  Patient identified by MRN, date of birth, ID band Patient awake    Reviewed: Allergy & Precautions, NPO status , Patient's Chart, lab work & pertinent test results  Airway Mallampati: III  TM Distance: >3 FB Neck ROM: Full    Dental  (+) Teeth Intact   Pulmonary former smoker,    breath sounds clear to auscultation       Cardiovascular  Rhythm:Irregular     Neuro/Psych    GI/Hepatic   Endo/Other    Renal/GU      Musculoskeletal   Abdominal   Peds  Hematology   Anesthesia Other Findings   Reproductive/Obstetrics                             Anesthesia Physical Anesthesia Plan  ASA: III  Anesthesia Plan: MAC   Post-op Pain Management:    Induction: Intravenous  PONV Risk Score and Plan: Ondansetron and Dexamethasone  Airway Management Planned: Natural Airway and Simple Face Mask  Additional Equipment:   Intra-op Plan:   Post-operative Plan:   Informed Consent: I have reviewed the patients History and Physical, chart, labs and discussed the procedure including the risks, benefits and alternatives for the proposed anesthesia with the patient or authorized representative who has indicated his/her understanding and acceptance.       Plan Discussed with: CRNA and Anesthesiologist  Anesthesia Plan Comments:         Anesthesia Quick Evaluation

## 2020-03-11 NOTE — Op Note (Signed)
Preoperative diagnosis: recurrent left breast chest wall cancer Postoperative diagnosis: saa Procedure: wide local excision of 10x6x2 cm chest wall lesion, recurrent breast cancer Surgeon: Dr Serita Grammes Anesthesia: local MAC EBL: 20 cc Specimen: left chest wall mass marked long lateral double deep Complications none Drains none Sponge and needle count correct dispo recovery stable.  Indications:  89 yof with blindness and is dnr underwent left mrm with Dr Dalbert Batman on 03/15/19 for t4bn1a er/pr neg, her 2 pos tumor. declined adjuvant therapy and has been doing ok until mass developed at lateral portion of scar. no imaging at this time. she is overall well but does not want to pursue aggressive care for this. I attempted a punch in office that was negative. she then has undergone u/s that shows a 1.3 cm mass. biopsy is grade III IDC that is her 2 pos, er/pr neg with ki of 30. We discussed options and elected to do WLE.  Procedure: After informed consent obtained she was taken to the OR.  She was placed under MAC. She was given ancef.  She was prepped and draped in standard sterile surgical fashion. A timeout was performed.  I infiltrated local anesthetic and made a large elliptical incision. I then carried this down to the chest wall. I did remove some pectoralis muscle to obtain a clean gross margin.  I then passed this off the table.  I then obtained hemostasis.  I then closed the tissue with 2-0 vicryl.  I then closed the skin and subcutaneous tissue with 3-0 nylon sutures. Bacitracin and dressing applied.  She tolerated well and was transferred to pacu stable.

## 2020-03-11 NOTE — H&P (Signed)
.  86 yof with blindness and is dnr underwent left mrm with Dr Dalbert Batman on 03/15/19 for t4bn1a er/pr neg, her 2 pos tumor. declined adjuvant therapy and has been doing ok until mass developed at lateral portion of scar. no imaging at this time. she is overall well but does not want to pursue aggressive care for this. I attempted a punch in office that was negative. she then has undergone u/s that shows a 1.3 cm mass. biopsy is grade III IDC that is her 2 pos, er/pr neg with ki of 30. she is here to discuss results   Past Surgical History Rolm Bookbinder, MD; 02/28/2020 10:34 AM) Tonsillectomy   Allergies Geni Bers Haggett, RMA; 02/28/2020 10:21 AM) Nuts  No Known Drug Allergies  [02/07/2019]: Allergies Reconciled   Medication History Rolm Bookbinder, MD; 02/28/2020 10:34 AM) Acetaminophen (500MG Tablet, Oral) Active. Vitamin D (Cholecalciferol) (25 MCG(1000 UT) Capsule, Oral) Active. Dorzolamide HCl-Timolol Mal (22.3-6.8MG/ML Solution, Ophthalmic) Active. Multi Vitamin (Oral) Active. Omega 3 (Oral) Specific strength unknown - Active. Travatan Z (0.004% Solution, Ophthalmic) Active. Medications Reconciled Tylenol (500MG Capsule, Oral) Active.  Social History Rolm Bookbinder, MD; 02/28/2020 10:34 AM) Alcohol use  Occasional alcohol use. Caffeine use  Coffee. Tobacco use  Former smoker.  Family History Rolm Bookbinder, MD; 02/28/2020 10:34 AM) Breast Cancer  Mother. Heart Disease  Father. Heart disease in female family member before age 23  Malignant Neoplasm Of Pancreas  Mother.  Vitals [employer] Haggett RMA; 02/28/2020 10:22 AM) 02/28/2020 10:21 AM Weight: 96.2 lb Height: 66in Body Surface Area: 1.47 m Body Mass Index: 15.53 kg/m  Temp.: 98.27F (Temporal)  Pulse: 124 (Regular)  P.OX: 97% (Room air) BP: 124/82(Sitting, Left Arm, Standard)       Physical Exam Rolm Bookbinder MD; 02/28/2020 10:33 AM) Breast Note: lateral mass near  mastectomy scar feels somewhat larger than last time, mobile nontender 1. 5cm     Assessment & Plan Rolm Bookbinder MD; 02/28/2020 10:34 AM) RECURRENT BREAST CANCER, LEFT (C50.912) Story: Left chest wall mass excision under local mac we discussed diagnosis today. options are to just follow- there really isnt any oral/iv treatment to give or that she would be willing to do. I am worried this is growing and will lead to local issues. i discussed wle under local mac and I think can go back to [facility] same day.

## 2020-03-11 NOTE — Transfer of Care (Signed)
Immediate Anesthesia Transfer of Care Note  Patient: Krystal [name]  Procedure(s) Performed: EXCISION CHEST WALL MASS (Left Breast)  Patient Location: PACU  Anesthesia Type:MAC  Level of Consciousness: awake, alert  and oriented  Airway & Oxygen Therapy: Patient Spontanous Breathing and Patient connected to face mask oxygen  Post-op Assessment: Report given to RN and Post -op Vital signs reviewed and stable  Post vital signs: Reviewed and stable  Last Vitals:  Vitals Value Taken Time  BP 133/83 03/11/20 1252  Temp    Pulse 77 03/11/20 1254  Resp 15 03/11/20 1254  SpO2 100 % 03/11/20 1254  Vitals shown include unvalidated device data.  Last Pain:  Vitals:   03/11/20 1137  TempSrc: Oral  PainSc: 0-No pain         Complications: No complications documented.

## 2020-03-11 NOTE — Anesthesia Postprocedure Evaluation (Signed)
Anesthesia Post Note  Patient: Krystal [name]  Procedure(s) Performed: EXCISION CHEST WALL MASS (Left Breast)     Patient location during evaluation: PACU Anesthesia Type: MAC Level of consciousness: awake and alert Pain management: pain level controlled Vital Signs Assessment: post-procedure vital signs reviewed and stable Respiratory status: spontaneous breathing, nonlabored ventilation, respiratory function stable and patient connected to nasal cannula oxygen Cardiovascular status: blood pressure returned to baseline and stable Postop Assessment: no apparent nausea or vomiting Anesthetic complications: no   No complications documented.  Last Vitals:  Vitals:   03/11/20 1315 03/11/20 1330  BP: (!) 146/83 140/67  Pulse: 70 79  Resp: 15 14  Temp:    SpO2: 100% 100%    Last Pain:  Vitals:   03/11/20 1330  TempSrc:   PainSc: 0-No pain                 Yohannes Waibel COKER

## 2020-03-11 NOTE — Interval H&P Note (Signed)
History and Physical Interval Note:  03/11/2020 11:30 AM  Krystal [name]  has presented today for surgery, with the diagnosis of RECURRENT LEFT BREAST CANCER.  The various methods of treatment have been discussed with the patient and family. After consideration of risks, benefits and other options for treatment, the patient has consented to  Procedure(s) with comments: EXCISION CHEST WALL MASS (Left) - LOCAL as a surgical intervention.  The patient's history has been reviewed, patient examined, no change in status, stable for surgery.  I have reviewed the patient's chart and labs.  Questions were answered to the patient's satisfaction.     Rolm Bookbinder

## 2020-03-13 LAB — SURGICAL PATHOLOGY

## 2020-03-16 ENCOUNTER — Encounter (HOSPITAL_BASED_OUTPATIENT_CLINIC_OR_DEPARTMENT_OTHER): Payer: Self-pay | Admitting: General Surgery

## 2020-03-17 ENCOUNTER — Encounter: Payer: Self-pay | Admitting: Internal Medicine

## 2020-03-17 ENCOUNTER — Non-Acute Institutional Stay: Payer: Medicare Other | Admitting: Internal Medicine

## 2020-03-17 DIAGNOSIS — Z9889 Other specified postprocedural states: Secondary | ICD-10-CM

## 2020-03-17 DIAGNOSIS — H548 Legal blindness, as defined in USA: Secondary | ICD-10-CM

## 2020-03-17 DIAGNOSIS — C50912 Malignant neoplasm of unspecified site of left female breast: Secondary | ICD-10-CM | POA: Diagnosis not present

## 2020-03-17 NOTE — Progress Notes (Signed)
Location:  [facility] [place] Room Number: 176 Place of Service:  ALF (13) Provider:  Loria Lacina L. Mariea Clonts, D.O., C.M.D.  Gayland Curry, DO  Patient Care Team: Gayland Curry, DO as PCP - General (Geriatric Medicine) Gerarda Fraction, MD as Referring Physician (Ophthalmology)  Extended Emergency Contact Information Primary Emergency Contact: Andrez Grime, [place] [address] Johnnette Litter of [place] Phone: [phone] Relation: Neighbor  Code Status:  DNR, MOST Goals of care: Advanced Directive information Advanced Directives 03/17/2020  Does Patient Have a Medical Advance Directive? Yes  Type of Paramedic of [place];Living will  Does patient want to make changes to medical advance directive? No - Patient declined  Copy of [place] in Chart? Yes - validated most recent copy scanned in chart (See row information)  Pre-existing out of facility DNR order (yellow form or pink MOST form) -     Chief Complaint  Patient presents with  . Acute Visit    Post-op for breast mass removal     HPI:  Pt is a 86 y.o. female with h/o left breat cancer and prior mastectomy, blindness, osteoporosis with prior hip fx, hyperglycemia, hyponatremia seen today for an acute visit for evaluation s/p removal of recurrent breast cancer mass from left chest wall.  Alekhya was showering and her surgical site was covered.  She reported no pain.  She "is miserable" about not having privacy and having to rely on others to help her with her progressive visual loss.  Fortunately, the borders were clean when the mass was removed.  She has always been slim but has lost more weight over the past year since her cancer.  She prefers healthy food options like veggies and fruits.  Past Medical History:  Diagnosis Date  . Anxiety   . Arthritis   . Back pain   . Borderline diabetes   . Compression fracture of lumbar vertebra ([place]) 2013    . Constipation   . Displaced intertrochanteric fracture of left femur ([place])   . Fracture of unspecified part of neck of unspecified femur, initial encounter for closed fracture ([place])   . GERD (gastroesophageal reflux disease)   . Hyperglycemia   . Hypo-osmolality and hyponatremia   . Legal blindness   . Macular degeneration    both eyes, blind  . Osteoporosis   . Recurrent breast cancer, left [facility])    Past Surgical History:  Procedure Laterality Date  . CATARACT EXTRACTION    . HIP FRACTURE SURGERY Left 2016  . INTRAMEDULLARY (IM) NAIL INTERTROCHANTERIC Left 03/24/2015   Procedure: INTRAMEDULLARY (IM) NAIL INTERTROCHANTRIC;  Surgeon: Rod Can, MD;  Location: [place];  Service: Orthopedics;  Laterality: Left;  Marland Kitchen MASS EXCISION Left 03/11/2020   Procedure: EXCISION CHEST WALL MASS;  Surgeon: Rolm Bookbinder, MD;  Location: [place];  Service: General;  Laterality: Left;  LOCAL  . MASTECTOMY WITH AXILLARY LYMPH NODE DISSECTION Left 03/15/2019   Procedure: LEFT TOTAL MASTECTOMY WITH REMOVAL OF INVOLVED LYMPH NODES;  Surgeon: Fanny Skates, MD;  Location: [place];  Service: General;  Laterality: Left;  . TONSILLECTOMY      No Known Allergies  Outpatient Encounter Medications as of 03/17/2020  Medication Sig  . acetaminophen (TYLENOL) 500 MG tablet Take 1,000 mg by mouth every 6 (six) hours as needed. For pain.  . cholecalciferol (VITAMIN D) 1000 UNITS tablet Take 2,000 Units by mouth daily.  . dorzolamide-timolol (COSOPT)  22.3-6.8 MG/ML ophthalmic solution Place 1 drop into the right eye 2 (two) times daily.  . Multiple Vitamin (MULITIVITAMIN WITH MINERALS) TABS Take 1 tablet by mouth daily.  Marland Kitchen omega-3 acid ethyl esters (LOVAZA) 1 G capsule Take 1 g by mouth daily.  . TRAVATAN Z 0.004 % SOLN ophthalmic solution Place 1 drop into the right eye at bedtime.    No facility-administered encounter medications on file as of 03/17/2020.    Review of Systems   Constitutional: Negative for chills and fever.  Respiratory: Negative for shortness of breath.   Cardiovascular: Negative for chest pain and palpitations.  Gastrointestinal: Negative for abdominal pain.  Genitourinary: Negative for dysuria.  Musculoskeletal: Negative for falls and joint pain.       No pain at surgical site noted  Skin: Negative for itching and rash.  Neurological: Negative for dizziness and loss of consciousness.  Psychiatric/Behavioral: Negative for depression and memory loss. The patient is not nervous/anxious and does not have insomnia.     Immunization History  Administered Date(s) Administered  . Influenza Inj Mdck Quad Pf 04/14/2016  . Influenza Split 04/08/2013  . Influenza,inj,Quad PF,6+ Mos 03/26/2015, 04/17/2018  . Influenza-Unspecified 04/08/2014, 04/14/2016, 04/17/2017, 04/24/2019  . Moderna SARS-COVID-2 Vaccination 07/08/2019, 08/06/2019  . Pneumococcal Conjugate-13 06/06/2014  . Pneumococcal Polysaccharide-23 05/02/2018  . Zoster 07/09/2012  . Zoster Recombinat (Shingrix) 07/21/2017, 10/03/2017   Pertinent  Health Maintenance Due  Topic Date Due  . URINE MICROALBUMIN  Never done  . INFLUENZA VACCINE  01/26/2020  . DEXA SCAN  10/25/2023 (Originally 12/17/1998)  . PNA vac Low Risk Adult  Completed   Fall Risk  01/08/2020 07/31/2019 05/01/2019 01/21/2019 10/31/2018  Falls in the past year? 0 0 0 0 0  Number falls in past yr: 0 0 0 0 0  Injury with Fall? 0 - 0 0 0  Risk for fall due to : - - - Impaired balance/gait;Impaired vision -  Follow up Falls evaluation completed - - Falls evaluation completed;Falls prevention discussed -   Functional Status Survey:    Vitals:   03/17/20 1246  BP: 115/74  Pulse: 81  Temp: 97.7 F (36.5 C)  SpO2: 95%  Weight: 98 lb (44.5 kg)  Height: 5\' 10"  (1.778 m)   Body mass index is 14.06 kg/m. Physical Exam Vitals reviewed.  Constitutional:      General: She is not in acute distress.    Appearance: She is not  toxic-appearing.     Comments: Cachectic female seated in shower  HENT:     Head: Normocephalic and atraumatic.  Eyes:     Comments: Blindness progressing--less ability to recognize people now even by outfits  Cardiovascular:     Rate and Rhythm: Normal rate and regular rhythm.  Pulmonary:     Effort: Pulmonary effort is normal.     Breath sounds: Normal breath sounds. No wheezing, rhonchi or rales.  Abdominal:     General: Bowel sounds are normal.  Musculoskeletal:        General: Normal range of motion.     Right lower leg: No edema.     Left lower leg: No edema.  Skin:    Comments: Dressing in place with covering to keep dry wrapping around her left side/chest wall, no surrounding warmth, erythema, drainage  Neurological:     General: No focal deficit present.     Mental Status: She is alert and oriented to person, place, and time.  Psychiatric:  Mood and Affect: Mood normal.        Behavior: Behavior normal.     Labs reviewed: Recent Labs    04/26/19 0000 01/02/20 0000  NA 138 134*  K 3.9 4.1  CL  --  94*  CO2  --  22  BUN 18 13  12   CREATININE 0.4* 0.4*  CALCIUM  --  9.9   Recent Labs    04/26/19 0000 01/02/20 0000  AST 12* 16  ALT 9 10  ALKPHOS 79  --   ALBUMIN  --  4.5   Recent Labs    04/26/19 0000 01/02/20 0000  WBC 4.3 5.6  HGB 13.2 15.2  HCT 37 45  PLT 197 205   No results found for: TSH Lab Results  Component Value Date   HGBA1C 6.2 01/02/2020   Lab Results  Component Value Date   CHOL 159 04/26/2019   HDL 67 04/26/2019   LDLCALC 82 04/26/2019   TRIG 49 04/26/2019    Significant Diagnostic Results in last 30 days:  No results found.  Assessment/Plan 1. Recurrent malignant neoplasm of left breast (HCC) -another lump was removed from her left chest wall by Dr. Donne Hazel and margins were clear -may have metastatic disease other places but no symptoms of this -goals are comfort-based and surgery was done to avoid more  discomfort for her  2. Legal blindness -ongoing and progressive -she's losing her independence and very unhappy about it -this is why she does not want her life prolonged  3. S/P lumpectomy, left breast -appears to be recovering well from this physically  Family/ staff Communication: discussed with AL med tech  Labs/tests ordered:  No new  Keep regular appt as scheduled in [place]. Darious Rehman, D.O. [facility] [address], [place] [address] Cell Phone (Mon-Fri 8am-5pm):  [phone] On Call:  [phone] & follow prompts after 5pm & weekends Office Phone:  [phone] Office Fax:  [phone]

## 2020-04-02 DIAGNOSIS — H353211 Exudative age-related macular degeneration, right eye, with active choroidal neovascularization: Secondary | ICD-10-CM | POA: Diagnosis not present

## 2020-04-27 ENCOUNTER — Other Ambulatory Visit: Payer: Self-pay | Admitting: Internal Medicine

## 2020-05-06 ENCOUNTER — Encounter: Payer: Self-pay | Admitting: Internal Medicine

## 2020-05-06 ENCOUNTER — Non-Acute Institutional Stay: Payer: Medicare Other | Admitting: Internal Medicine

## 2020-05-06 DIAGNOSIS — H548 Legal blindness, as defined in USA: Secondary | ICD-10-CM | POA: Diagnosis not present

## 2020-05-06 DIAGNOSIS — R29898 Other symptoms and signs involving the musculoskeletal system: Secondary | ICD-10-CM | POA: Diagnosis not present

## 2020-05-06 DIAGNOSIS — Z853 Personal history of malignant neoplasm of breast: Secondary | ICD-10-CM

## 2020-05-06 DIAGNOSIS — R29810 Facial weakness: Secondary | ICD-10-CM

## 2020-05-06 DIAGNOSIS — R4781 Slurred speech: Secondary | ICD-10-CM

## 2020-05-06 NOTE — Progress Notes (Signed)
Location:  [place] Room Number: 381 Place of Service:  ALF (13) Provider:  Matilde Pottenger L. Mariea Clonts, D.O., C.M.D.  Gayland Curry, DO  Patient Care Team: Gayland Curry, DO as PCP - General (Geriatric Medicine) Gerarda Fraction, MD as Referring Physician (Ophthalmology)  Extended Emergency Contact Information Primary Emergency Contact: Andrez Grime, [place] [address] Johnnette Litter of [place] Phone: [phone] Relation: Neighbor  Code Status:  DNR, MOST Goals of care: Advanced Directive information Advanced Directives 05/06/2020  Does Patient Have a Medical Advance Directive? -  Type of Advance Directive Out of facility DNR (pink MOST or yellow form);Healthcare Power of Attorney  Does patient want to make changes to medical advance directive? No - Patient declined  Copy of [place] in Chart? -  Pre-existing out of facility DNR order (yellow form or pink MOST form) -     Chief Complaint  Patient presents with  . Acute Visit    Face drooping, slurring speach     HPI:  Pt is a 86 y.o. female with legal blindness, breast cancer s/p mastectomy and subsequent recurrent lumpectomy from left chest wall seen today for an acute visit for right facial droop, slurring speech and loss of function of her right arm.  She reported having had that happen a couple of times before.  She had asked a nurse to come check her and the nurse manager asked me to see her around 115pm.  Symptoms were improving--she continued with some slurred speech, right facial droop, but use of right arm and strength of right arm had returned to normal.   We discussed hospitalization for potential TPA since her symptoms were acute onset and seen by nursing during her meal, but she refused stating that usually her symptoms resolve.  Note that she never told me she'd had these ever before.  She reports that (consistent with all of our prior conversations),  she does not want to keep living blind and dependent on others.  I reminded her that her symptoms this time may not resolve and could even worsen leaving her more physically debilitated and dependent.  She said she did not want to go to the hospital no matter what.  She prefers to stay in her AL room where she knows where everything is.   While I was there, she asked for me to check on the area where she'd had the lump removed from her left chest wall--there is some folded over skin that she was concerned could be recurrent malignancy already (t's been about 2 mos now).  We agreed to monitor it b/c it does look like it's extra skin and there is no solution.    Past Medical History:  Diagnosis Date  . Anxiety   . Arthritis   . Back pain   . Borderline diabetes   . Compression fracture of lumbar vertebra ([place]) 2013  . Constipation   . Displaced intertrochanteric fracture of left femur ([place])   . Fracture of unspecified part of neck of unspecified femur, initial encounter for closed fracture ([place])   . GERD (gastroesophageal reflux disease)   . Hyperglycemia   . Hypo-osmolality and hyponatremia   . Legal blindness   . Macular degeneration    both eyes, blind  . Osteoporosis   . Recurrent breast cancer, left [facility])    Past Surgical History:  Procedure Laterality Date  . CATARACT EXTRACTION    . HIP  FRACTURE SURGERY Left 2016  . INTRAMEDULLARY (IM) NAIL INTERTROCHANTERIC Left 03/24/2015   Procedure: INTRAMEDULLARY (IM) NAIL INTERTROCHANTRIC;  Surgeon: Rod Can, MD;  Location: [place];  Service: Orthopedics;  Laterality: Left;  Marland Kitchen MASS EXCISION Left 03/11/2020   Procedure: EXCISION CHEST WALL MASS;  Surgeon: Rolm Bookbinder, MD;  Location: [place];  Service: General;  Laterality: Left;  LOCAL  . MASTECTOMY WITH AXILLARY LYMPH NODE DISSECTION Left 03/15/2019   Procedure: LEFT TOTAL MASTECTOMY WITH REMOVAL OF INVOLVED LYMPH NODES;  Surgeon: Fanny Skates, MD;  Location: [place];  Service: General;  Laterality: Left;  . TONSILLECTOMY      No Known Allergies  Outpatient Encounter Medications as of 05/06/2020  Medication Sig  . acetaminophen (TYLENOL) 500 MG tablet Take 1,000 mg by mouth every 6 (six) hours as needed. For pain.  . cholecalciferol (VITAMIN D) 1000 UNITS tablet Take 2,000 Units by mouth daily.  . dorzolamide-timolol (COSOPT) 22.3-6.8 MG/ML ophthalmic solution Place 1 drop into the right eye 2 (two) times daily.  . Multiple Vitamin (MULITIVITAMIN WITH MINERALS) TABS Take 1 tablet by mouth daily.  Marland Kitchen omega-3 acid ethyl esters (LOVAZA) 1 G capsule Take 1 g by mouth daily.  . Travoprost, BAK Free, (TRAVATAN) 0.004 % SOLN ophthalmic solution INSTILL 1 DROP IN THE RIGHT EYE AT BEDTIME - MAY SELF ADMINISTER   No facility-administered encounter medications on file as of 05/06/2020.    Review of Systems  Constitutional: Negative for chills and fever.  HENT: Negative for congestion and sore throat.   Eyes: Positive for blurred vision.  Respiratory: Negative for shortness of breath.   Cardiovascular: Negative for chest pain, palpitations and leg swelling.  Gastrointestinal: Negative for abdominal pain and constipation.  Genitourinary: Negative for dysuria.  Musculoskeletal: Negative for falls.  Skin: Negative for itching and rash.  Neurological: Positive for sensory change, speech change and focal weakness. Negative for dizziness, loss of consciousness and headaches.       Facial droop  Psychiatric/Behavioral: Negative for memory loss. The patient is not nervous/anxious and does not have insomnia.     Immunization History  Administered Date(s) Administered  . Influenza Inj Mdck Quad Pf 04/14/2016  . Influenza Split 04/08/2013  . Influenza,inj,Quad PF,6+ Mos 03/26/2015, 04/17/2018  . Influenza-Unspecified 04/08/2014, 04/14/2016, 04/17/2017, 04/24/2019  . Moderna SARS-COVID-2 Vaccination 07/08/2019, 08/06/2019  . Pneumococcal Conjugate-13 06/06/2014   . Pneumococcal Polysaccharide-23 05/02/2018  . Zoster 07/09/2012  . Zoster Recombinat (Shingrix) 07/21/2017, 10/03/2017   Pertinent  Health Maintenance Due  Topic Date Due  . URINE MICROALBUMIN  Never done  . INFLUENZA VACCINE  01/26/2020  . DEXA SCAN  10/25/2023 (Originally 12/17/1998)  . PNA vac Low Risk Adult  Completed   Fall Risk  05/06/2020 01/08/2020 07/31/2019 05/01/2019 01/21/2019  Falls in the past year? 0 0 0 0 0  Number falls in past yr: 0 0 0 0 0  Injury with Fall? 0 0 - 0 0  Risk for fall due to : - - - - Impaired balance/gait;Impaired vision  Follow up - Falls evaluation completed - - Falls evaluation completed;Falls prevention discussed   Functional Status Survey:    Vitals:   05/06/20 1326  BP: 108/70  Pulse: 86  Temp: (!) 97.2 F (36.2 C)  SpO2: 98%  Weight: 95 lb 9.6 oz (43.4 kg)  Height: 5\' 10"  (1.778 m)   Body mass index is 13.72 kg/m. Physical Exam Vitals reviewed.  Constitutional:      General: She is not in  acute distress.    Appearance: She is not toxic-appearing.  HENT:     Head: Normocephalic and atraumatic.     Right Ear: External ear normal.     Left Ear: External ear normal.     Nose: Nose normal.     Mouth/Throat:     Comments: Right facial droop (more flattening), dysarthric speech Eyes:     Extraocular Movements: Extraocular movements intact.     Conjunctiva/sclera: Conjunctivae normal.     Pupils: Pupils are equal, round, and reactive to light.  Cardiovascular:     Rate and Rhythm: Normal rate and regular rhythm.     Heart sounds: No murmur heard.   Pulmonary:     Effort: Pulmonary effort is normal.     Breath sounds: Normal breath sounds.  Chest:     Comments: Left mastectomy; area of second surgery has some folded up skin, nontender around midaxillary line (note pathology had shown clean margins) Abdominal:     General: Bowel sounds are normal.  Musculoskeletal:        General: Normal range of motion.     Cervical back:  Neck supple.     Right lower leg: No edema.     Left lower leg: No edema.  Skin:    General: Skin is warm and dry.  Neurological:     Mental Status: She is alert and oriented to person, place, and time.     Cranial Nerves: Cranial nerve deficit present.     Comments: Now upper extremity strength is equal and able to hold onto a cup with right hand which she could not just a few mins before  Psychiatric:        Mood and Affect: Mood normal.     Labs reviewed: Recent Labs    01/02/20 0000  NA 134*  K 4.1  CL 94*  CO2 22  BUN 13  12  CREATININE 0.4*  CALCIUM 9.9   Recent Labs    01/02/20 0000  AST 16  ALT 10  ALBUMIN 4.5   Recent Labs    01/02/20 0000  WBC 5.6  HGB 15.2  HCT 45  PLT 205   No results found for: TSH Lab Results  Component Value Date   HGBA1C 6.2 01/02/2020   Lab Results  Component Value Date   CHOL 159 04/26/2019   HDL 67 04/26/2019   LDLCALC 82 04/26/2019   TRIG 49 04/26/2019    Assessment/Plan 1. Right arm weakness -improved already, monitor vitals and neurochecks -she refuses hospitalization consistent with her otherwise expressed wishes and MOST form  2. Slurred speech -improved but some remained at our visit -monitor  3. Facial droop -improved also even over course of our visit it seemed  4. Legal blindness -continues to progress and she's becoming more dependent which she hates  5. History of breast cancer -had recurrence on left chest wall/mets that was resected with clean margins per path -has area that is lumpy to her but it appears at this point to be folded up skin  -she has said she wants nothing else done in terms of this cancer so monitor area  Family/ staff Communication: d/w AL nurse, nurse manager (present during visit)  Labs/tests ordered:  Vitals and neurochecks, may need ST if dysarthria and facial droop do not resolve  Ulyess Muto L. Alec Mcphee, D.O. [facility] [address], [place] [address] Cell Phone (Mon-Fri 8am-5pm):  [phone] On Call:  [phone] & follow prompts after 5pm & weekends Office Phone:  [phone] Office Fax:  [phone]

## 2020-05-09 DIAGNOSIS — Z23 Encounter for immunization: Secondary | ICD-10-CM | POA: Diagnosis not present

## 2020-06-04 DIAGNOSIS — Z853 Personal history of malignant neoplasm of breast: Secondary | ICD-10-CM | POA: Diagnosis not present

## 2020-06-04 DIAGNOSIS — H353231 Exudative age-related macular degeneration, bilateral, with active choroidal neovascularization: Secondary | ICD-10-CM | POA: Diagnosis not present

## 2020-06-04 DIAGNOSIS — H401114 Primary open-angle glaucoma, right eye, indeterminate stage: Secondary | ICD-10-CM | POA: Diagnosis not present

## 2020-06-04 DIAGNOSIS — Z961 Presence of intraocular lens: Secondary | ICD-10-CM | POA: Diagnosis not present

## 2020-06-23 ENCOUNTER — Telehealth: Payer: Self-pay | Admitting: Nurse Practitioner

## 2020-06-23 NOTE — Telephone Encounter (Signed)
Called patient to schedule AWV. Left vm. 

## 2020-07-12 ENCOUNTER — Telehealth: Payer: Self-pay | Admitting: Internal Medicine

## 2020-07-12 NOTE — Telephone Encounter (Signed)
Patient had episode of TIA with Right sided weakness and Facial Drop. Is comfort care so no transfers to ED. Will start her on Baby Aspirin QD. Nurse will discuss with her.

## 2020-07-12 NOTE — Telephone Encounter (Signed)
Thank you.  This is her 3rd TIA.  I doubt she'll take the aspirin.  She really wants nothing done due to her blindness.

## 2020-08-13 DIAGNOSIS — H401114 Primary open-angle glaucoma, right eye, indeterminate stage: Secondary | ICD-10-CM | POA: Diagnosis not present

## 2020-08-13 DIAGNOSIS — H353211 Exudative age-related macular degeneration, right eye, with active choroidal neovascularization: Secondary | ICD-10-CM | POA: Diagnosis not present

## 2020-08-17 ENCOUNTER — Encounter: Payer: Self-pay | Admitting: Internal Medicine

## 2020-08-18 ENCOUNTER — Non-Acute Institutional Stay: Payer: Medicare Other | Admitting: Internal Medicine

## 2020-08-18 ENCOUNTER — Encounter: Payer: Self-pay | Admitting: Internal Medicine

## 2020-08-18 DIAGNOSIS — G459 Transient cerebral ischemic attack, unspecified: Secondary | ICD-10-CM | POA: Diagnosis not present

## 2020-08-18 DIAGNOSIS — Z7189 Other specified counseling: Secondary | ICD-10-CM | POA: Diagnosis not present

## 2020-08-18 DIAGNOSIS — Z66 Do not resuscitate: Secondary | ICD-10-CM | POA: Diagnosis not present

## 2020-08-18 DIAGNOSIS — C50912 Malignant neoplasm of unspecified site of left female breast: Secondary | ICD-10-CM | POA: Diagnosis not present

## 2020-08-18 DIAGNOSIS — H548 Legal blindness, as defined in USA: Secondary | ICD-10-CM | POA: Diagnosis not present

## 2020-08-18 NOTE — Progress Notes (Addendum)
Location:  [facility] Room Number: 379-K Place of Service:  ALF (13) Provider:  Soma Lizak L. Mariea Clonts, D.O., C.M.D.  Gayland Curry, DO  Patient Care Team: Gayland Curry, DO as PCP - General (Geriatric Medicine) Gerarda Fraction, MD as Referring Physician (Ophthalmology)  Extended Emergency Contact Information Primary Emergency Contact: Andrez Grime, [place] [address] Johnnette Litter of [place] Phone: [phone] Relation: Neighbor  Code Status:  DNR Goals of care: Advanced Directive information Advanced Directives 08/18/2020  Does Patient Have a Medical Advance Directive? Yes  Type of Paramedic of [place];Out of facility DNR (pink MOST or yellow form)  Does patient want to make changes to medical advance directive? No - Patient declined  Copy of [place] in Chart? Yes - validated most recent copy scanned in chart (See row information)  Pre-existing out of facility DNR order (yellow form or pink MOST form) Yellow form placed in chart (order not valid for inpatient use);Pink MOST form placed in chart (order not valid for inpatient use)   Chief Complaint  Patient presents with  . Acute Visit    TIA 2/17 again    HPI:  Pt is a 87 y.o. female seen today for an acute visit for recurrent TIA on 2/17 and med mgt of chronic diseases as f/u appt was not scheduled after I last saw her in clinic.    Candiace continues to have decreases in her vision.  She is frustrated by this and would prefer not to live in this state.  She requires help to go through her mail and has challenges seeing even with her magnifier now.  She has difficulty feeding herself--uses her fingers.    She has no pain PHYSICALLY.  She is not aware of any new breast cancer lesions and does not want [place] looking for anything because she does not want anything done to prolong her life.  She's had at least 3 TIAs now and she did agree  begrudgingly to add a baby aspirin to her regimen after the last one.    She remains on several drops for her glaucoma.  She has osteoporosis with prior hip fx and takes vitamin D but declines other meds.  She eats small meals and prefers healthier options--was used to fresh fruits and veggies, lean protein and had eaten that way on her own for years.  She has lost 2 more lbs since November of last year.  When seen, she was attempting to eat her evening meal and stopped abruptly when I came b/c she does not want anyone to see her eating with her hands.    Past Medical History:  Diagnosis Date  . Anxiety   . Arthritis   . Back pain   . Borderline diabetes   . Compression fracture of lumbar vertebra ([place]) 2013  . Constipation   . Displaced intertrochanteric fracture of left femur ([place])   . Fracture of unspecified part of neck of unspecified femur, initial encounter for closed fracture ([place])   . GERD (gastroesophageal reflux disease)   . Hyperglycemia   . Hypo-osmolality and hyponatremia   . Legal blindness   . Macular degeneration    both eyes, blind  . Osteoporosis   . Recurrent breast cancer, left [facility])    Past Surgical History:  Procedure Laterality Date  . CATARACT EXTRACTION    . HIP FRACTURE SURGERY Left 2016  . INTRAMEDULLARY (IM) NAIL  INTERTROCHANTERIC Left 03/24/2015   Procedure: INTRAMEDULLARY (IM) NAIL INTERTROCHANTRIC;  Surgeon: Rod Can, MD;  Location: [facility];  Service: Orthopedics;  Laterality: Left;  Marland Kitchen MASS EXCISION Left 03/11/2020   Procedure: EXCISION CHEST WALL MASS;  Surgeon: Rolm Bookbinder, MD;  Location: [place] [facility];  Service: General;  Laterality: Left;  LOCAL  . MASTECTOMY WITH AXILLARY LYMPH NODE DISSECTION Left 03/15/2019   Procedure: LEFT TOTAL MASTECTOMY WITH REMOVAL OF INVOLVED LYMPH NODES;  Surgeon: Fanny Skates, MD;  Location: [facility];  Service: General;  Laterality: Left;  . TONSILLECTOMY      No Known Allergies  Outpatient  Encounter Medications as of 08/18/2020  Medication Sig  . acetaminophen (TYLENOL) 500 MG tablet Take 1,000 mg by mouth every 6 (six) hours as needed. For pain.  Marland Kitchen aspirin EC 81 MG tablet Take 81 mg by mouth daily. Swallow whole.  . cholecalciferol (VITAMIN D) 1000 UNITS tablet Take 2,000 Units by mouth daily.  . dorzolamide-timolol (COSOPT) 22.3-6.8 MG/ML ophthalmic solution Place 1 drop into the right eye 2 (two) times daily.  . Multiple Vitamin (MULITIVITAMIN WITH MINERALS) TABS Take 1 tablet by mouth daily.  Marland Kitchen omega-3 acid ethyl esters (LOVAZA) 1 G capsule Take 1 g by mouth daily.  . Travoprost, BAK Free, (TRAVATAN) 0.004 % SOLN ophthalmic solution INSTILL 1 DROP IN THE RIGHT EYE AT BEDTIME - MAY SELF ADMINISTER   No facility-administered encounter medications on file as of 08/18/2020.    Review of Systems  Constitutional: Negative for chills and fever.  HENT: Negative for congestion, hearing loss and sore throat.   Eyes: Positive for blurred vision.       Legally blind  Respiratory: Negative for cough and shortness of breath.   Cardiovascular: Negative for chest pain, palpitations and leg swelling.  Gastrointestinal: Negative for abdominal pain, constipation and diarrhea.  Genitourinary: Negative for dysuria.  Musculoskeletal: Negative for falls and joint pain.  Skin: Negative for itching and rash.  Neurological: Positive for speech change and focal weakness. Negative for dizziness and loss of consciousness.       May have had stroke as some mild residual UE weakness and speech challenges she did not have before  Endo/Heme/Allergies: Bruises/bleeds easily.  Psychiatric/Behavioral: Positive for depression. Negative for memory loss. The patient is not nervous/anxious and does not have insomnia.        Declines meds    Immunization History  Administered Date(s) Administered  . Influenza Inj Mdck Quad Pf 04/14/2016  . Influenza Split 04/08/2013  . Influenza, High Dose Seasonal PF  04/17/2020  . Influenza,inj,Quad PF,6+ Mos 03/26/2015, 04/17/2018  . Influenza-Unspecified 04/08/2014, 04/14/2016, 04/17/2017, 04/24/2019  . Moderna SARS-COV2 Booster Vaccination 05/09/2020  . Moderna Sars-Covid-2 Vaccination 07/08/2019, 08/06/2019  . Pneumococcal Conjugate-13 06/06/2014  . Pneumococcal Polysaccharide-23 05/02/2018  . Zoster 07/09/2012  . Zoster Recombinat (Shingrix) 07/21/2017, 10/03/2017   Pertinent  Health Maintenance Due  Topic Date Due  . DEXA SCAN  10/25/2023 (Originally 12/17/1998)  . INFLUENZA VACCINE  Completed  . PNA vac Low Risk Adult  Completed   Fall Risk  05/08/2020 05/06/2020 01/08/2020 07/31/2019 05/01/2019  Falls in the past year? - 0 0 0 0  Number falls in past yr: - 0 0 0 0  Injury with Fall? - 0 0 - 0  Risk for fall due to : Impaired vision;History of fall(s) - - - -  Follow up Education provided;Falls evaluation completed;Falls prevention discussed - Falls evaluation completed - -   Functional Status Survey:  Vitals:   08/18/20 1458  BP: 116/87  Pulse: 95  Resp: 20  Temp: (!) 97.3 F (36.3 C)  SpO2: 98%  Weight: 93 lb 12.8 oz (42.5 kg)  Height: 5\' 10"  (1.778 m)   Body mass index is 13.46 kg/m. Physical Exam Vitals and nursing note reviewed.  Constitutional:      Appearance: Normal appearance.     Comments: Cachectic female  HENT:     Head: Normocephalic and atraumatic.     Right Ear: External ear normal.     Left Ear: External ear normal.  Eyes:     Conjunctiva/sclera: Conjunctivae normal.  Cardiovascular:     Rate and Rhythm: Normal rate and regular rhythm.     Pulses: Normal pulses.     Heart sounds: Normal heart sounds.  Pulmonary:     Effort: Pulmonary effort is normal.     Breath sounds: Normal breath sounds. No wheezing, rhonchi or rales.  Abdominal:     General: Bowel sounds are normal.     Palpations: Abdomen is soft.  Musculoskeletal:        General: Normal range of motion.     Right lower leg: No edema.      Left lower leg: No edema.  Neurological:     Mental Status: She is alert.     Motor: Weakness present.     Gait: Gait abnormal.     Comments: 4+/5 RUE, some word-finding with slight slurring vs prior baseline; uses walker  Psychiatric:        Mood and Affect: Mood normal.        Behavior: Behavior normal.        Thought Content: Thought content normal.        Judgment: Judgment normal.     Labs reviewed: Recent Labs    01/02/20 0000  NA 134*  K 4.1  CL 94*  CO2 22  BUN 13  12  CREATININE 0.4*  CALCIUM 9.9   Recent Labs    01/02/20 0000  AST 16  ALT 10  ALBUMIN 4.5   Recent Labs    01/02/20 0000  WBC 5.6  HGB 15.2  HCT 45  PLT 205   No results found for: TSH Lab Results  Component Value Date   HGBA1C 6.2 01/02/2020   Lab Results  Component Value Date   CHOL 159 04/26/2019   HDL 67 04/26/2019   LDLCALC 82 04/26/2019   TRIG 49 04/26/2019     Assessment/Plan 1. TIA (transient ischemic attack) -may have had some permanent effects indicating true stroke this last time with slight residual RUE weakness, dysarthria, unfortunately -cont baby asa  -DO NOT HOSPITALIZE in event of recurrent symptoms--goals are comfort-based and she wants to stay in her AL apt where she knows where things are due to problem #2  2. Legal blindness -has progressed and struggles more with eating, less mobile (does not walk around facility anymore with walker like she did), eats in room -more socially isolated than before -follows with ophtho and uses drops for glaucoma but also has macular  3. Recurrent malignant neoplasm of left breast ([place]) -suspect she still has some malignancy but borders were clean with last nodule resection with Dr. Donne Hazel -she does not want anymore surgeries, has always declined XRT and chemo options -weight continues to trend down -again, goals are comfort-based  4. ACP (advance care planning) -she does NOT want more meds, to be hospitalized, IVFs,  tube feeding, abx --she does NOT  want her life prolonged by artificial means  5. Do not resuscitate - Do not attempt resuscitation (DNR)  I will continue to follow her with [facility]  Family/ staff Communication: d/w AL nurse  Labs/tests ordered:  No new  Shritha Bresee L. Duard Spiewak, D.O. [facility] [address], [place] [address] Cell Phone (Mon-Fri 8am-5pm):  [phone] On Call:  [phone] & follow prompts after 5pm & weekends Office Phone:  [phone] Office Fax:  [phone]

## 2020-09-01 ENCOUNTER — Non-Acute Institutional Stay: Payer: Medicare Other | Admitting: Internal Medicine

## 2020-09-01 DIAGNOSIS — R29898 Other symptoms and signs involving the musculoskeletal system: Secondary | ICD-10-CM | POA: Diagnosis not present

## 2020-09-01 DIAGNOSIS — H548 Legal blindness, as defined in USA: Secondary | ICD-10-CM

## 2020-09-01 DIAGNOSIS — G459 Transient cerebral ischemic attack, unspecified: Secondary | ICD-10-CM | POA: Diagnosis not present

## 2020-09-01 DIAGNOSIS — I69392 Facial weakness following cerebral infarction: Secondary | ICD-10-CM | POA: Diagnosis not present

## 2020-09-01 DIAGNOSIS — I69320 Aphasia following cerebral infarction: Secondary | ICD-10-CM | POA: Diagnosis not present

## 2020-09-01 DIAGNOSIS — R634 Abnormal weight loss: Secondary | ICD-10-CM | POA: Diagnosis not present

## 2020-09-01 DIAGNOSIS — I69322 Dysarthria following cerebral infarction: Secondary | ICD-10-CM | POA: Diagnosis not present

## 2020-09-01 NOTE — Progress Notes (Signed)
Location:  Occupational psychologist of Service:  ALF (13) Provider:  Kden Wagster L. Mariea Clonts, D.O., C.M.D.  Gayland Curry, DO  Patient Care Team: Gayland Curry, DO as PCP - General (Geriatric Medicine) Gerarda Fraction, MD as Referring Physician (Ophthalmology)  Extended Emergency Contact Information Primary Emergency Contact: Andrez Grime, [place] [address] Johnnette Litter of [place] Phone: [phone] Relation: [place]  Code Status:  DNR Goals of care: Advanced Directive information Advanced Directives 08/18/2020  Does Patient Have a Medical Advance Directive? Yes  Type of Paramedic of [place];Out of facility DNR (pink MOST or yellow form)  Does patient want to make changes to medical advance directive? No - Patient declined  Copy of [place] in Chart? Yes - validated most recent copy scanned in chart (See row information)  Pre-existing out of facility DNR order (yellow form or pink MOST form) Yellow form placed in chart (order not valid for inpatient use);Pink MOST form placed in chart (order not valid for inpatient use)   Chief Complaint  Patient presents with  . Acute Visit    Back to back TIAs, prior stroke with residual facial droop, dysarthria and some aphasia, very minimal right arm weakness    HPI:  Pt is a Krystal [name] with h/o prior stroke (ongoing symptoms), recurrent TIAs with right arm weakness, dysarthria, facial droop and aphasia, breast cancer with left lumpectomy and then later recurrence with new lump resected from left side, blindness, DMII, frailty seen today for an acute visit for back to back TIAs last night and this am.  Tamirah remains in her AL apt at her request due to her awareness of where her things are there in view of here blindness.  She has longstanding wishes for comfort care.  At one point, on call provided added aspirin to her regimen with the TIAs which she is reluctantly  taking now.  One of the events has left her with residual dysarthria and some aphasia which is frustrating her even more.  She is getting assistance with her paperwork and business matters and AL CNAs get her set up for her showers and help her some with her dressing as needed.  She gets meds from med techs.    This time, she's had the same series of symptoms including facial droop, dysarthria, and aphasia with right arm weakness both episodes.  The weakness has resolved, but she has residual facial droop, dysarthria and aphasia.  MOST she has completed calls for DNR, no hospitalizations, abx, fluids or feeding tubes.    Past Medical History:  Diagnosis Date  . Anxiety   . Arthritis   . Back pain   . Borderline diabetes   . Compression fracture of lumbar vertebra ([place]) 2013  . Constipation   . Displaced intertrochanteric fracture of left femur ([place])   . Fracture of unspecified part of neck of unspecified femur, initial encounter for closed fracture ([place])   . GERD (gastroesophageal reflux disease)   . Hyperglycemia   . Hypo-osmolality and hyponatremia   . Legal blindness   . Macular degeneration    both eyes, blind  . Osteoporosis   . Recurrent breast cancer, left [facility])    Past Surgical History:  Procedure Laterality Date  . CATARACT EXTRACTION    . HIP FRACTURE SURGERY Left 2016  . INTRAMEDULLARY (IM) NAIL INTERTROCHANTERIC Left 03/24/2015   Procedure: INTRAMEDULLARY (IM) NAIL INTERTROCHANTRIC;  Surgeon:  Rod Can, MD;  Location: [facility] [place];  Service: Orthopedics;  Laterality: Left;  Marland Kitchen MASS EXCISION Left 03/11/2020   Procedure: EXCISION CHEST WALL MASS;  Surgeon: Rolm Bookbinder, MD;  Location: [facility];  Service: General;  Laterality: Left;  LOCAL  . MASTECTOMY WITH AXILLARY LYMPH NODE DISSECTION Left 03/15/2019   Procedure: LEFT TOTAL MASTECTOMY WITH REMOVAL OF INVOLVED LYMPH NODES;  Surgeon: Fanny Skates, MD;  Location: [facility];  Service: General;  Laterality:  Left;  . TONSILLECTOMY      No Known Allergies  Outpatient Encounter Medications as of 09/01/2020  Medication Sig  . acetaminophen (TYLENOL) 500 MG tablet Take 1,000 mg by mouth every 6 (six) hours as needed. For pain.  Marland Kitchen aspirin EC 81 MG tablet Take 81 mg by mouth daily. Swallow whole.  . cholecalciferol (VITAMIN D) 1000 UNITS tablet Take 2,000 Units by mouth daily.  . dorzolamide-timolol (COSOPT) 22.3-6.8 MG/ML ophthalmic solution Place 1 drop into the right eye 2 (two) times daily.  . Multiple Vitamin (MULITIVITAMIN WITH MINERALS) TABS Take 1 tablet by mouth daily.  Marland Kitchen omega-3 acid ethyl esters (LOVAZA) 1 G capsule Take 1 g by mouth daily.  . Travoprost, BAK Free, (TRAVATAN) 0.004 % SOLN ophthalmic solution INSTILL 1 DROP IN THE RIGHT EYE AT BEDTIME - MAY SELF ADMINISTER   No facility-administered encounter medications on file as of 09/01/2020.    Review of Systems  Constitutional: Negative for chills, fever and malaise/fatigue.  Eyes: Positive for blurred vision (legal blindness now near total blindness).  Respiratory: Negative for cough and shortness of breath.   Cardiovascular: Negative for chest pain, palpitations and leg swelling.  Gastrointestinal: Negative for abdominal pain and constipation.  Genitourinary: Negative for dysuria.  Musculoskeletal: Negative for falls and joint pain.  Skin: Negative for itching and rash.  Neurological: Positive for tingling, sensory change, speech change and focal weakness. Negative for dizziness, tremors, loss of consciousness and headaches.       Tingling numbness weakness resolved  Endo/Heme/Allergies: Bruises/bleeds easily.  Psychiatric/Behavioral: Positive for depression. Negative for memory loss. The patient is not nervous/anxious and does not have insomnia.     Immunization History  Administered Date(s) Administered  . Influenza Inj Mdck Quad Pf 04/14/2016  . Influenza Split 04/08/2013  . Influenza, High Dose Seasonal PF 04/17/2020  .  Influenza,inj,Quad PF,6+ Mos 03/26/2015, 04/17/2018  . Influenza-Unspecified 04/08/2014, 04/14/2016, 04/17/2017, 04/24/2019  . Moderna SARS-COV2 Booster Vaccination 05/09/2020  . Moderna Sars-Covid-2 Vaccination 07/08/2019, 08/06/2019  . Pneumococcal Conjugate-13 06/06/2014  . Pneumococcal Polysaccharide-23 05/02/2018  . Zoster 07/09/2012  . Zoster Recombinat (Shingrix) 07/21/2017, 10/03/2017   Pertinent  Health Maintenance Due  Topic Date Due  . DEXA SCAN  10/25/2023 (Originally 12/17/1998)  . INFLUENZA VACCINE  Completed  . PNA vac Low Risk Adult  Completed   Fall Risk  05/08/2020 05/06/2020 01/08/2020 07/31/2019 05/01/2019  Falls in the past year? - 0 0 0 0  Number falls in past yr: - 0 0 0 0  Injury with Fall? - 0 0 - 0  Risk for fall due to : Impaired vision;History of fall(s) - - - -  Follow up Education provided;Falls evaluation completed;Falls prevention discussed - Falls evaluation completed - -   Functional Status Survey:    There were no vitals filed for this visit. There is no height or weight on file to calculate BMI. Physical Exam Vitals and nursing note reviewed.  Constitutional:      General: She is not in acute distress.  Appearance: She is not ill-appearing or toxic-appearing.     Comments: cachectic [name] seated on her sofa  HENT:     Head: Normocephalic and atraumatic.  Eyes:     Extraocular Movements: Extraocular movements intact.     Conjunctiva/sclera: Conjunctivae normal.     Pupils: Pupils are equal, round, and reactive to light.     Comments: No longer able to recognize me close up  Cardiovascular:     Rate and Rhythm: Normal rate and regular rhythm.  Pulmonary:     Effort: Pulmonary effort is normal.     Breath sounds: Normal breath sounds.  Abdominal:     General: Bowel sounds are normal.  Skin:    General: Skin is warm and dry.     Coloration: Skin is pale.  Neurological:     Mental Status: She is alert and oriented to person, place, and  time.     Cranial Nerves: Cranial nerve deficit present.     Sensory: No sensory deficit.     Motor: No weakness.     Coordination: Coordination normal.     Gait: Gait abnormal.     Deep Tendon Reflexes: Reflexes normal.     Comments: Uses walker; right facial droop, dysarthria and some aphasia  Psychiatric:        Mood and Affect: Mood normal.     Labs reviewed: Recent Labs    01/02/20 0000  NA 134*  K 4.1  CL 94*  CO2 22  BUN 13  12  CREATININE 0.4*  CALCIUM 9.9   Recent Labs    01/02/20 0000  AST 16  ALT 10  ALBUMIN 4.5   Recent Labs    01/02/20 0000  WBC 5.6  HGB 15.2  HCT 45  PLT 205   No results found for: TSH Lab Results  Component Value Date   HGBA1C 6.2 01/02/2020   Lab Results  Component Value Date   CHOL 159 04/26/2019   HDL 67 04/26/2019   LDLCALC 82 04/26/2019   TRIG 49 04/26/2019    Significant Diagnostic Results in last 30 days:  No results found.  Assessment/Plan 1. Facial droop due to old stroke -persists though right arm weakness better -suspect imaging would show chronic changes now -cont comfort care, asa only, no imaging or hospitalization  2. Dysarthria as late effect of cerebrovascular accident (CVA) -persists as well  3. Aphasia as late effect of cerebrovascular accident -persists and frustrates her (already frustrated by her visual loss that's led to functional dependence she never wanted)  4. Right arm weakness -resolved, from #5  5. TIA (transient ischemic attack) -appears one of these was a true ischemic stroke given persistent symptoms--not clear which event it was  6. Legal blindness -progressing and she does not want her life prolonged in with this  -has always been staunchly independent and nothing saddens her more than functional dependence due to her visual and now other medical decline  7. Weight loss, non-intentional -this is ongoing and she may have more metastatic disease from her breast cancer, but  she does not want this looked into  Family/ staff Communication: discussed with AL nurse  Labs/tests ordered:  No new  I will continue to follow [place] with palliative care.  Fremont Skalicky L. Gitel Beste, D.O. [facility] [address], [place] [address] Cell Phone (Mon-Fri 8am-5pm):  [phone] On Call:  [phone] & follow prompts after 5pm & weekends Office Phone:  [phone]  Office Fax:  [phone]

## 2020-09-06 ENCOUNTER — Encounter: Payer: Self-pay | Admitting: Internal Medicine

## 2020-09-22 ENCOUNTER — Telehealth: Payer: Self-pay | Admitting: Internal Medicine

## 2020-09-22 NOTE — Telephone Encounter (Signed)
Called patient and told them to call me back to schedule an awv

## 2020-10-02 DIAGNOSIS — M25562 Pain in left knee: Secondary | ICD-10-CM | POA: Diagnosis not present

## 2020-11-12 ENCOUNTER — Non-Acute Institutional Stay: Payer: Medicare Other | Admitting: Adult Health

## 2020-11-12 ENCOUNTER — Encounter: Payer: Self-pay | Admitting: Adult Health

## 2020-11-12 DIAGNOSIS — H401114 Primary open-angle glaucoma, right eye, indeterminate stage: Secondary | ICD-10-CM | POA: Diagnosis not present

## 2020-11-12 DIAGNOSIS — M546 Pain in thoracic spine: Secondary | ICD-10-CM | POA: Diagnosis not present

## 2020-11-12 DIAGNOSIS — H353231 Exudative age-related macular degeneration, bilateral, with active choroidal neovascularization: Secondary | ICD-10-CM | POA: Diagnosis not present

## 2020-11-12 DIAGNOSIS — L89102 Pressure ulcer of unspecified part of back, stage 2: Secondary | ICD-10-CM

## 2020-11-12 DIAGNOSIS — I639 Cerebral infarction, unspecified: Secondary | ICD-10-CM | POA: Diagnosis not present

## 2020-11-12 DIAGNOSIS — H353211 Exudative age-related macular degeneration, right eye, with active choroidal neovascularization: Secondary | ICD-10-CM | POA: Diagnosis not present

## 2020-11-12 DIAGNOSIS — R634 Abnormal weight loss: Secondary | ICD-10-CM

## 2020-11-12 DIAGNOSIS — Z961 Presence of intraocular lens: Secondary | ICD-10-CM | POA: Diagnosis not present

## 2020-11-12 MED ORDER — TRAMADOL HCL 50 MG PO TABS: 25.0000 mg | ORAL_TABLET | Freq: Four times a day (QID) | ORAL | 0 refills | Status: AC | PRN

## 2020-11-12 NOTE — Progress Notes (Signed)
Location:  Occupational psychologist of Service:  ALF (13) Provider:   Cindi Carbon, [place] [phone]   Virgie Dad, MD  Patient Care Team: Virgie Dad, MD as PCP - General (Internal Medicine) Gerarda Fraction, MD as Referring Physician (Ophthalmology)  Extended Emergency Contact Information Primary Emergency Contact: Andrez Grime, [place] [address] Johnnette Litter of [place] Phone: [phone] Relation: Neighbor  Code Status:  DNR Goals of care: Advanced Directive information Advanced Directives 08/18/2020  Does Patient Have a Medical Advance Directive? Yes  Type of Paramedic of [place];Out of facility DNR (pink MOST or yellow form)  Does patient want to make changes to medical advance directive? No - Patient declined  Copy of [place] in Chart? Yes - validated most recent copy scanned in chart (See row information)  Pre-existing out of facility DNR order (yellow form or pink MOST form) Yellow form placed in chart (order not valid for inpatient use);Pink MOST form placed in chart (order not valid for inpatient use)     Chief Complaint  Patient presents with  . Acute Visit    Back pain due to pressure area    HPI:  Pt is a 87 y.o. female seen today for an acute visit for back pain due to a pressure area. She has a PMH significant for macular degeneration with blindiness, CVA with facial droop, breast cancer s/p left total mastectomy 2020 and left chest wall mass excision 03/11/20.    She has weight loss and is declining functionally and now requires 24 hr care. She has an area of pressure to the thoracic spine. She is now 88 lbs, lost 5 lbs in the past three months. Eats small amts. Doesn't want to turn and relieve pressure to her back. Doesn't want to change bed. She has been taking tylenol for her back pain but now feels she needs something stronger. She takes prune juice  for constipation   Wt Readings from Last 3 Encounters:  11/12/20 88 lb (39.9 kg)  09/01/20 94 lb (42.6 kg)  08/18/20 93 lb 12.8 oz (42.5 kg)      Past Medical History:  Diagnosis Date  . Anxiety   . Arthritis   . Back pain   . Borderline diabetes   . Compression fracture of lumbar vertebra ([place]) 2013  . Constipation   . Displaced intertrochanteric fracture of left femur ([place])   . Fracture of unspecified part of neck of unspecified femur, initial encounter for closed fracture ([place])   . GERD (gastroesophageal reflux disease)   . Hyperglycemia   . Hypo-osmolality and hyponatremia   . Legal blindness   . Macular degeneration    both eyes, blind  . Osteoporosis   . Recurrent breast cancer, left [facility])    Past Surgical History:  Procedure Laterality Date  . CATARACT EXTRACTION    . HIP FRACTURE SURGERY Left 2016  . INTRAMEDULLARY (IM) NAIL INTERTROCHANTERIC Left 03/24/2015   Procedure: INTRAMEDULLARY (IM) NAIL INTERTROCHANTRIC;  Surgeon: Rod Can, MD;  Location: [place];  Service: Orthopedics;  Laterality: Left;  Marland Kitchen MASS EXCISION Left 03/11/2020   Procedure: EXCISION CHEST WALL MASS;  Surgeon: Rolm Bookbinder, MD;  Location: [place];  Service: General;  Laterality: Left;  LOCAL  . MASTECTOMY WITH AXILLARY LYMPH NODE DISSECTION Left 03/15/2019   Procedure: LEFT TOTAL MASTECTOMY WITH REMOVAL OF INVOLVED LYMPH NODES;  Surgeon: Dalbert Batman,  Renelda Loma, MD;  Location: [place];  Service: General;  Laterality: Left;  . TONSILLECTOMY      No Known Allergies  Outpatient Encounter Medications as of 11/12/2020  Medication Sig  . traMADol (ULTRAM) 50 MG tablet Take 0.5 tablets (25 mg total) by mouth every 6 (six) hours as needed.  Marland Kitchen acetaminophen (TYLENOL) 500 MG tablet Take 1,000 mg by mouth every 6 (six) hours as needed. For pain.  Marland Kitchen aspirin EC 81 MG tablet Take 81 mg by mouth daily. Swallow whole.  . dorzolamide-timolol (COSOPT) 22.3-6.8 MG/ML ophthalmic solution Place 1  drop into the right eye 2 (two) times daily.  . Travoprost, BAK Free, (TRAVATAN) 0.004 % SOLN ophthalmic solution INSTILL 1 DROP IN THE RIGHT EYE AT BEDTIME - MAY SELF ADMINISTER  . [DISCONTINUED] cholecalciferol (VITAMIN D) 1000 UNITS tablet Take 2,000 Units by mouth daily.  . [DISCONTINUED] Multiple Vitamin (MULITIVITAMIN WITH MINERALS) TABS Take 1 tablet by mouth daily.  . [DISCONTINUED] omega-3 acid ethyl esters (LOVAZA) 1 G capsule Take 1 g by mouth daily.   No facility-administered encounter medications on file as of 11/12/2020.    Review of Systems  Constitutional: Positive for activity change, appetite change and unexpected weight change. Negative for fever.  Eyes: Positive for visual disturbance.  Respiratory: Negative for cough and shortness of breath.   Cardiovascular: Negative for chest pain and leg swelling.  Gastrointestinal: Negative for abdominal distention, constipation, diarrhea and nausea.  Musculoskeletal: Positive for back pain and gait problem.  Skin: Positive for wound.  Neurological: Positive for facial asymmetry and weakness.    Immunization History  Administered Date(s) Administered  . Influenza Inj Mdck Quad Pf 04/14/2016  . Influenza Split 04/08/2013  . Influenza, High Dose Seasonal PF 04/17/2020  . Influenza,inj,Quad PF,6+ Mos 03/26/2015, 04/17/2018  . Influenza-Unspecified 04/08/2014, 04/14/2016, 04/17/2017, 04/24/2019  . Moderna SARS-COV2 Booster Vaccination 05/09/2020  . Moderna Sars-Covid-2 Vaccination 07/08/2019, 08/06/2019  . Pneumococcal Conjugate-13 06/06/2014  . Pneumococcal Polysaccharide-23 05/02/2018  . Zoster 07/09/2012  . Zoster Recombinat (Shingrix) 07/21/2017, 10/03/2017   Pertinent  Health Maintenance Due  Topic Date Due  . URINE MICROALBUMIN  Never done  . DEXA SCAN  10/25/2023 (Originally 12/17/1998)  . INFLUENZA VACCINE  01/25/2021  . PNA vac Low Risk Adult  Completed   Fall Risk  05/08/2020 05/06/2020 01/08/2020 07/31/2019  05/01/2019  Falls in the past year? - 0 0 0 0  Number falls in past yr: - 0 0 0 0  Injury with Fall? - 0 0 - 0  Risk for fall due to : Impaired vision;History of fall(s) - - - -  Follow up Education provided;Falls evaluation completed;Falls prevention discussed - Falls evaluation completed - -   Functional Status Survey:    Vitals:   11/12/20 1424  Weight: 88 lb (39.9 kg)   Body mass index is 12.63 kg/m. Physical Exam Vitals and nursing note reviewed.  Constitutional:      General: She is not in acute distress.    Appearance: She is not diaphoretic.     Comments: Frail thin  HENT:     Head: Normocephalic and atraumatic.  Neck:     Vascular: No JVD.  Cardiovascular:     Rate and Rhythm: Normal rate and regular rhythm.     Heart sounds: No murmur heard.   Pulmonary:     Effort: Pulmonary effort is normal. No respiratory distress.     Breath sounds: Normal breath sounds. No wheezing.  Musculoskeletal:     Right lower  leg: No edema.     Left lower leg: No edema.  Skin:    General: Skin is warm and dry.     Comments: Pressure area stage 2 to thoracic spine with surrounding erythema. Pink tissue, no drainage or swelling. Not tender.   Neurological:     Mental Status: She is alert. Mental status is at baseline.     Labs reviewed: Recent Labs    01/02/20 0000  NA 134*  K 4.1  CL 94*  CO2 22  BUN 13  12  CREATININE 0.4*  CALCIUM 9.9   Recent Labs    01/02/20 0000  AST 16  ALT 10  ALBUMIN 4.5   Recent Labs    01/02/20 0000  WBC 5.6  HGB 15.2  HCT 45  PLT 205   No results found for: TSH Lab Results  Component Value Date   HGBA1C 6.2 01/02/2020   Lab Results  Component Value Date   CHOL 159 04/26/2019   HDL 67 04/26/2019   LDLCALC 82 04/26/2019   TRIG 49 04/26/2019    Significant Diagnostic Results in last 30 days:  No results found.  Assessment/Plan 1. Acute midline thoracic back pain Due to pressure injury - traMADol (ULTRAM) 50 MG  tablet; Take 0.5 tablets (25 mg total) by mouth every 6 (six) hours as needed.  Dispense: 30 tablet; Refill: 0 Add miralax 17 grams qd prn  2. Pressure injury of back, stage 2 (HCC) Dressing changes qod with aquacel per nursing staff Provide turning, pressure relief when pt is agreeable  Has egg crate, doesn't want to change bed from a queen to allow for an air mattress  3. Weight loss Due to decreased intake and possible metastatic breast ca which we are not working up due to her goals of care Will add breeze   D/ C vitamins as the nurse states she is no longer taking them  She is appropriate for hospice but declined their services at this time. She has support at [facility] and 24 hr care givers. Was on palliative care, not sure when her next visit will be   Family/ staff Communication: F/U with me in the clinic in 2 weeks   Labs/tests ordered:  NA

## 2020-11-26 ENCOUNTER — Non-Acute Institutional Stay: Payer: Medicare Other | Admitting: Adult Health

## 2020-11-26 ENCOUNTER — Encounter: Payer: Self-pay | Admitting: Adult Health

## 2020-11-26 DIAGNOSIS — M810 Age-related osteoporosis without current pathological fracture: Secondary | ICD-10-CM

## 2020-11-26 DIAGNOSIS — L89102 Pressure ulcer of unspecified part of back, stage 2: Secondary | ICD-10-CM

## 2020-11-26 DIAGNOSIS — R4589 Other symptoms and signs involving emotional state: Secondary | ICD-10-CM | POA: Diagnosis not present

## 2020-11-26 DIAGNOSIS — G8929 Other chronic pain: Secondary | ICD-10-CM | POA: Diagnosis not present

## 2020-11-26 DIAGNOSIS — R634 Abnormal weight loss: Secondary | ICD-10-CM | POA: Diagnosis not present

## 2020-11-26 DIAGNOSIS — R531 Weakness: Secondary | ICD-10-CM | POA: Diagnosis not present

## 2020-11-26 DIAGNOSIS — M546 Pain in thoracic spine: Secondary | ICD-10-CM

## 2020-11-26 DIAGNOSIS — C50912 Malignant neoplasm of unspecified site of left female breast: Secondary | ICD-10-CM

## 2020-11-26 DIAGNOSIS — H548 Legal blindness, as defined in USA: Secondary | ICD-10-CM | POA: Diagnosis not present

## 2020-11-26 NOTE — Progress Notes (Signed)
Location:  [place] Room Number: 761-P Place of Service:  ALF [phone]) Provider:  Royal Hawthorn, NP  Patient Care Team: Virgie Dad, MD as PCP - General (Internal Medicine) Gerarda Fraction, MD as Referring Physician (Ophthalmology)  Extended Emergency Contact Information Primary Emergency Contact: Andrez Grime, [place] [address] Johnnette Litter of [place] Phone: [phone] Relation: Neighbor  Code Status:  DNR Goals of care: Advanced Directive information Advanced Directives 08/18/2020  Does Patient Have a Medical Advance Directive? Yes  Type of Paramedic of [place];Out of facility DNR (pink MOST or yellow form)  Does patient want to make changes to medical advance directive? No - Patient declined  Copy of [place] in Chart? Yes - validated most recent copy scanned in chart (See row information)  Pre-existing out of facility DNR order (yellow form or pink MOST form) Yellow form placed in chart (order not valid for inpatient use);Pink MOST form placed in chart (order not valid for inpatient use)     Chief Complaint  Patient presents with  . Medical Management of Chronic Issues    Routine visit and discuss need for TD/tdap, 2nd covid booster, and MALB     HPI:  Pt is a 87 y.o. female seen today for medical management of chronic diseases.   PMH significant for macular degeneration with blindiness, CVA with facial droop, breast cancer s/p left total mastectomy 2020 and left chest wall mass excision 03/11/20.  She has been declining with decreased appetite and weight loss for the past several months. She eats small amts of food and does not like the food. She is not having any swallowing issues related to food but sometimes with pills. She has a nodule at the left breast where her prior mastectomy is located. She has a stage 2 pressure injury at the thoracic spine. She has been experiencing  anger/depression due to a loss of her vision and the fact that she has had to live in [place] for several years. Now she has 24 hr care givers and technically is a skilled level of care. Her goals of care are comfort based and she has declined hospice care.   Past Medical History:  Diagnosis Date  . Anxiety   . Arthritis   . Back pain   . Borderline diabetes   . Compression fracture of lumbar vertebra ([place]) 2013  . Constipation   . Displaced intertrochanteric fracture of left femur ([place])   . Fracture of unspecified part of neck of unspecified femur, initial encounter for closed fracture ([place])   . GERD (gastroesophageal reflux disease)   . Hyperglycemia   . Hypo-osmolality and hyponatremia   . Legal blindness   . Macular degeneration    both eyes, blind  . Osteoporosis   . Recurrent breast cancer, left [facility])    Past Surgical History:  Procedure Laterality Date  . CATARACT EXTRACTION    . HIP FRACTURE SURGERY Left 2016  . INTRAMEDULLARY (IM) NAIL INTERTROCHANTERIC Left 03/24/2015   Procedure: INTRAMEDULLARY (IM) NAIL INTERTROCHANTRIC;  Surgeon: Rod Can, MD;  Location: [place];  Service: Orthopedics;  Laterality: Left;  Marland Kitchen MASS EXCISION Left 03/11/2020   Procedure: EXCISION CHEST WALL MASS;  Surgeon: Rolm Bookbinder, MD;  Location: [place];  Service: General;  Laterality: Left;  LOCAL  . MASTECTOMY WITH AXILLARY LYMPH NODE DISSECTION Left 03/15/2019   Procedure: LEFT TOTAL MASTECTOMY WITH REMOVAL OF INVOLVED LYMPH  NODES;  Surgeon: Fanny Skates, MD;  Location: [place];  Service: General;  Laterality: Left;  . TONSILLECTOMY      No Known Allergies  Outpatient Encounter Medications as of 11/26/2020  Medication Sig  . acetaminophen (TYLENOL) 500 MG tablet Take 1,000 mg by mouth in the morning, at noon, and at bedtime. For pain.  Marland Kitchen aspirin EC 81 MG tablet Take 81 mg by mouth daily. Swallow whole.  . chlorhexidine (PERIDEX) 0.12 % solution Use as directed 5 mLs in the  mouth or throat daily.  . dorzolamide-timolol (COSOPT) 22.3-6.8 MG/ML ophthalmic solution Place 1 drop into the right eye 2 (two) times daily.  . feeding supplement (BOOST / RESOURCE BREEZE) LIQD Take 1 Container by mouth daily.  . polyethylene glycol (MIRALAX / GLYCOLAX) 17 g packet Take 17 g by mouth daily as needed.  . traMADol (ULTRAM) 50 MG tablet Take 0.5 tablets (25 mg total) by mouth every 6 (six) hours as needed.  . Travoprost, BAK Free, (TRAVATAN) 0.004 % SOLN ophthalmic solution INSTILL 1 DROP IN THE RIGHT EYE AT BEDTIME - MAY SELF ADMINISTER   No facility-administered encounter medications on file as of 11/26/2020.    Review of Systems  Constitutional: Positive for activity change and appetite change. Negative for chills, diaphoresis, fatigue, fever and unexpected weight change.  HENT: Negative for congestion.   Respiratory: Negative for cough, shortness of breath and wheezing.   Cardiovascular: Negative for chest pain, palpitations and leg swelling.  Gastrointestinal: Negative for abdominal distention, abdominal pain, constipation and diarrhea.  Genitourinary: Negative for difficulty urinating and dysuria.  Musculoskeletal: Positive for back pain and gait problem. Negative for arthralgias, joint swelling and myalgias.  Skin: Positive for wound.  Neurological: Negative for dizziness, tremors, seizures, syncope, facial asymmetry, speech difficulty, weakness, light-headedness, numbness and headaches.  Psychiatric/Behavioral: Positive for confusion and dysphoric mood. Negative for agitation and behavioral problems.    Immunization History  Administered Date(s) Administered  . Influenza Inj Mdck Quad Pf 04/14/2016  . Influenza Split 04/08/2013  . Influenza, High Dose Seasonal PF 04/17/2020  . Influenza,inj,Quad PF,6+ Mos 03/26/2015, 04/17/2018  . Influenza-Unspecified 04/08/2014, 04/14/2016, 04/17/2017, 04/24/2019  . Moderna SARS-COV2 Booster Vaccination 05/09/2020  . Moderna  Sars-Covid-2 Vaccination 07/08/2019, 08/06/2019  . Pneumococcal Conjugate-13 06/06/2014  . Pneumococcal Polysaccharide-23 05/02/2018  . Zoster Recombinat (Shingrix) 07/21/2017, 10/03/2017  . Zoster, Live 07/09/2012   Pertinent  Health Maintenance Due  Topic Date Due  . URINE MICROALBUMIN  Never done  . DEXA SCAN  10/25/2023 (Originally 12/17/1998)  . INFLUENZA VACCINE  01/25/2021  . PNA vac Low Risk Adult  Completed   Fall Risk  05/08/2020 05/06/2020 01/08/2020 07/31/2019 05/01/2019  Falls in the past year? - 0 0 0 0  Number falls in past yr: - 0 0 0 0  Injury with Fall? - 0 0 - 0  Risk for fall due to : Impaired vision;History of fall(s) - - - -  Follow up Education provided;Falls evaluation completed;Falls prevention discussed - Falls evaluation completed - -   Functional Status Survey:    Vitals:   11/26/20 1021  BP: 128/87  Pulse: 61  Resp: 18  Temp: 97.7 F (36.5 C)  SpO2: 94%  Weight: 83 lb 9.6 oz (37.9 kg)  Height: 5\' 10"  (1.778 m)   Body mass index is 12 kg/m.  Wt Readings from Last 3 Encounters:  11/26/20 83 lb 9.6 oz (37.9 kg)  11/12/20 88 lb (39.9 kg)  09/01/20 94 lb (42.6 kg)  Physical Exam Vitals and nursing note reviewed.  Constitutional:      General: She is not in acute distress.    Appearance: She is not diaphoretic.     Comments: Frail thin appearing  HENT:     Head: Normocephalic and atraumatic.  Neck:     Vascular: No JVD.  Cardiovascular:     Rate and Rhythm: Normal rate and regular rhythm.     Heart sounds: No murmur heard.   Pulmonary:     Effort: Pulmonary effort is normal. No respiratory distress.     Breath sounds: Normal breath sounds. No wheezing.  Chest:  Breasts:     Left: Mass (raise area to lef tupper outer quad with mild erythema. ) present.    Musculoskeletal:        General: No swelling, tenderness, deformity (kyphotic) or signs of injury.     Right lower leg: No edema.     Left lower leg: No edema.  Skin:     General: Skin is warm and dry.  Neurological:     Mental Status: She is alert and oriented to person, place, and time.     Comments: Right facial droop     Labs reviewed: Recent Labs    01/02/20 0000  NA 134*  K 4.1  CL 94*  CO2 22  BUN 13  12  CREATININE 0.4*  CALCIUM 9.9   Recent Labs    01/02/20 0000  AST 16  ALT 10  ALBUMIN 4.5   Recent Labs    01/02/20 0000  WBC 5.6  HGB 15.2  HCT 45  PLT 205   No results found for: TSH Lab Results  Component Value Date   HGBA1C 6.2 01/02/2020   Lab Results  Component Value Date   CHOL 159 04/26/2019   HDL 67 04/26/2019   LDLCALC 82 04/26/2019   TRIG 49 04/26/2019    Significant Diagnostic Results in last 30 days:  No results found.  Assessment/Plan  1. Invasive ductal carcinoma of breast, female, left ([place]) Likely has a recurrence and possibles mets given her weight loss. She expresses that she only wants comfort care, hates living in assisted living and is ready to pass. Followed by palliative and declined hospice at this time.   2. Pressure injury of back, stage 2 ([place]) Dressing changes qod with aquacel per nursing staff Provide turning, pressure relief when pt is agreeable  Has egg crate, doesn't want to change bed from a queen to allow for an air mattress  3. Chronic midline thoracic back pain Controlled with tylenol and prn ultram   4. Legal blindness Led to assisted living status  5. Senile osteoporosis Minimal weight bearing at this time. No further intervention indicated.   6. Weight loss Due to decreased intake and possible cancer recurrence Continue boost/breeze  7. Weakness Now requiring 1:1 care in [place] due to the above issues.   8. Dysphoric mood Appears depressed about her state of health but declined trying antidepressants.    Family/ staff Communication: discussed with the resident and her nurse Tabitha  Labs/tests ordered:  Na due to goals of care

## 2020-11-30 ENCOUNTER — Encounter: Payer: Self-pay | Admitting: Adult Health

## 2020-12-02 DIAGNOSIS — L814 Other melanin hyperpigmentation: Secondary | ICD-10-CM | POA: Diagnosis not present

## 2020-12-02 DIAGNOSIS — D485 Neoplasm of uncertain behavior of skin: Secondary | ICD-10-CM | POA: Diagnosis not present

## 2020-12-02 DIAGNOSIS — C44329 Squamous cell carcinoma of skin of other parts of face: Secondary | ICD-10-CM | POA: Diagnosis not present

## 2020-12-28 DIAGNOSIS — Z87891 Personal history of nicotine dependence: Secondary | ICD-10-CM | POA: Diagnosis not present

## 2020-12-28 DIAGNOSIS — C50912 Malignant neoplasm of unspecified site of left female breast: Secondary | ICD-10-CM | POA: Diagnosis not present

## 2020-12-28 DIAGNOSIS — R296 Repeated falls: Secondary | ICD-10-CM | POA: Diagnosis not present

## 2020-12-28 DIAGNOSIS — Z8673 Personal history of transient ischemic attack (TIA), and cerebral infarction without residual deficits: Secondary | ICD-10-CM | POA: Diagnosis not present

## 2020-12-28 DIAGNOSIS — R222 Localized swelling, mass and lump, trunk: Secondary | ICD-10-CM | POA: Diagnosis not present

## 2020-12-28 DIAGNOSIS — H409 Unspecified glaucoma: Secondary | ICD-10-CM | POA: Diagnosis not present

## 2020-12-28 DIAGNOSIS — E119 Type 2 diabetes mellitus without complications: Secondary | ICD-10-CM | POA: Diagnosis not present

## 2020-12-28 DIAGNOSIS — E43 Unspecified severe protein-calorie malnutrition: Secondary | ICD-10-CM | POA: Diagnosis not present

## 2020-12-28 DIAGNOSIS — K219 Gastro-esophageal reflux disease without esophagitis: Secondary | ICD-10-CM | POA: Diagnosis not present

## 2020-12-28 DIAGNOSIS — M810 Age-related osteoporosis without current pathological fracture: Secondary | ICD-10-CM | POA: Diagnosis not present

## 2020-12-28 DIAGNOSIS — F419 Anxiety disorder, unspecified: Secondary | ICD-10-CM | POA: Diagnosis not present

## 2020-12-28 DIAGNOSIS — K0510 Chronic gingivitis, plaque induced: Secondary | ICD-10-CM | POA: Diagnosis not present

## 2020-12-29 DIAGNOSIS — Z8673 Personal history of transient ischemic attack (TIA), and cerebral infarction without residual deficits: Secondary | ICD-10-CM | POA: Diagnosis not present

## 2020-12-29 DIAGNOSIS — E43 Unspecified severe protein-calorie malnutrition: Secondary | ICD-10-CM | POA: Diagnosis not present

## 2020-12-29 DIAGNOSIS — R222 Localized swelling, mass and lump, trunk: Secondary | ICD-10-CM | POA: Diagnosis not present

## 2020-12-29 DIAGNOSIS — E119 Type 2 diabetes mellitus without complications: Secondary | ICD-10-CM | POA: Diagnosis not present

## 2020-12-29 DIAGNOSIS — R296 Repeated falls: Secondary | ICD-10-CM | POA: Diagnosis not present

## 2020-12-29 DIAGNOSIS — C50912 Malignant neoplasm of unspecified site of left female breast: Secondary | ICD-10-CM | POA: Diagnosis not present

## 2020-12-30 DIAGNOSIS — Z8673 Personal history of transient ischemic attack (TIA), and cerebral infarction without residual deficits: Secondary | ICD-10-CM | POA: Diagnosis not present

## 2020-12-30 DIAGNOSIS — C50912 Malignant neoplasm of unspecified site of left female breast: Secondary | ICD-10-CM | POA: Diagnosis not present

## 2020-12-30 DIAGNOSIS — E119 Type 2 diabetes mellitus without complications: Secondary | ICD-10-CM | POA: Diagnosis not present

## 2020-12-30 DIAGNOSIS — R296 Repeated falls: Secondary | ICD-10-CM | POA: Diagnosis not present

## 2020-12-30 DIAGNOSIS — R222 Localized swelling, mass and lump, trunk: Secondary | ICD-10-CM | POA: Diagnosis not present

## 2020-12-30 DIAGNOSIS — E43 Unspecified severe protein-calorie malnutrition: Secondary | ICD-10-CM | POA: Diagnosis not present

## 2020-12-31 DIAGNOSIS — E43 Unspecified severe protein-calorie malnutrition: Secondary | ICD-10-CM | POA: Diagnosis not present

## 2020-12-31 DIAGNOSIS — E119 Type 2 diabetes mellitus without complications: Secondary | ICD-10-CM | POA: Diagnosis not present

## 2020-12-31 DIAGNOSIS — R296 Repeated falls: Secondary | ICD-10-CM | POA: Diagnosis not present

## 2020-12-31 DIAGNOSIS — R222 Localized swelling, mass and lump, trunk: Secondary | ICD-10-CM | POA: Diagnosis not present

## 2020-12-31 DIAGNOSIS — C50912 Malignant neoplasm of unspecified site of left female breast: Secondary | ICD-10-CM | POA: Diagnosis not present

## 2020-12-31 DIAGNOSIS — Z8673 Personal history of transient ischemic attack (TIA), and cerebral infarction without residual deficits: Secondary | ICD-10-CM | POA: Diagnosis not present

## 2021-01-05 DIAGNOSIS — E43 Unspecified severe protein-calorie malnutrition: Secondary | ICD-10-CM | POA: Diagnosis not present

## 2021-01-05 DIAGNOSIS — R296 Repeated falls: Secondary | ICD-10-CM | POA: Diagnosis not present

## 2021-01-05 DIAGNOSIS — C50912 Malignant neoplasm of unspecified site of left female breast: Secondary | ICD-10-CM | POA: Diagnosis not present

## 2021-01-05 DIAGNOSIS — E119 Type 2 diabetes mellitus without complications: Secondary | ICD-10-CM | POA: Diagnosis not present

## 2021-01-05 DIAGNOSIS — Z8673 Personal history of transient ischemic attack (TIA), and cerebral infarction without residual deficits: Secondary | ICD-10-CM | POA: Diagnosis not present

## 2021-01-05 DIAGNOSIS — R222 Localized swelling, mass and lump, trunk: Secondary | ICD-10-CM | POA: Diagnosis not present

## 2021-01-06 DIAGNOSIS — R296 Repeated falls: Secondary | ICD-10-CM | POA: Diagnosis not present

## 2021-01-06 DIAGNOSIS — Z8673 Personal history of transient ischemic attack (TIA), and cerebral infarction without residual deficits: Secondary | ICD-10-CM | POA: Diagnosis not present

## 2021-01-06 DIAGNOSIS — C50912 Malignant neoplasm of unspecified site of left female breast: Secondary | ICD-10-CM | POA: Diagnosis not present

## 2021-01-06 DIAGNOSIS — R222 Localized swelling, mass and lump, trunk: Secondary | ICD-10-CM | POA: Diagnosis not present

## 2021-01-06 DIAGNOSIS — E119 Type 2 diabetes mellitus without complications: Secondary | ICD-10-CM | POA: Diagnosis not present

## 2021-01-06 DIAGNOSIS — E43 Unspecified severe protein-calorie malnutrition: Secondary | ICD-10-CM | POA: Diagnosis not present

## 2021-01-13 DIAGNOSIS — R222 Localized swelling, mass and lump, trunk: Secondary | ICD-10-CM | POA: Diagnosis not present

## 2021-01-13 DIAGNOSIS — E119 Type 2 diabetes mellitus without complications: Secondary | ICD-10-CM | POA: Diagnosis not present

## 2021-01-13 DIAGNOSIS — E43 Unspecified severe protein-calorie malnutrition: Secondary | ICD-10-CM | POA: Diagnosis not present

## 2021-01-13 DIAGNOSIS — Z8673 Personal history of transient ischemic attack (TIA), and cerebral infarction without residual deficits: Secondary | ICD-10-CM | POA: Diagnosis not present

## 2021-01-13 DIAGNOSIS — R296 Repeated falls: Secondary | ICD-10-CM | POA: Diagnosis not present

## 2021-01-13 DIAGNOSIS — C50912 Malignant neoplasm of unspecified site of left female breast: Secondary | ICD-10-CM | POA: Diagnosis not present

## 2021-01-20 DIAGNOSIS — E43 Unspecified severe protein-calorie malnutrition: Secondary | ICD-10-CM | POA: Diagnosis not present

## 2021-01-20 DIAGNOSIS — E119 Type 2 diabetes mellitus without complications: Secondary | ICD-10-CM | POA: Diagnosis not present

## 2021-01-20 DIAGNOSIS — Z8673 Personal history of transient ischemic attack (TIA), and cerebral infarction without residual deficits: Secondary | ICD-10-CM | POA: Diagnosis not present

## 2021-01-20 DIAGNOSIS — C50912 Malignant neoplasm of unspecified site of left female breast: Secondary | ICD-10-CM | POA: Diagnosis not present

## 2021-01-20 DIAGNOSIS — R222 Localized swelling, mass and lump, trunk: Secondary | ICD-10-CM | POA: Diagnosis not present

## 2021-01-20 DIAGNOSIS — R296 Repeated falls: Secondary | ICD-10-CM | POA: Diagnosis not present

## 2021-01-22 DIAGNOSIS — R296 Repeated falls: Secondary | ICD-10-CM | POA: Diagnosis not present

## 2021-01-22 DIAGNOSIS — E43 Unspecified severe protein-calorie malnutrition: Secondary | ICD-10-CM | POA: Diagnosis not present

## 2021-01-22 DIAGNOSIS — E119 Type 2 diabetes mellitus without complications: Secondary | ICD-10-CM | POA: Diagnosis not present

## 2021-01-22 DIAGNOSIS — R222 Localized swelling, mass and lump, trunk: Secondary | ICD-10-CM | POA: Diagnosis not present

## 2021-01-22 DIAGNOSIS — C50912 Malignant neoplasm of unspecified site of left female breast: Secondary | ICD-10-CM | POA: Diagnosis not present

## 2021-01-22 DIAGNOSIS — Z8673 Personal history of transient ischemic attack (TIA), and cerebral infarction without residual deficits: Secondary | ICD-10-CM | POA: Diagnosis not present

## 2021-01-25 DIAGNOSIS — E119 Type 2 diabetes mellitus without complications: Secondary | ICD-10-CM | POA: Diagnosis not present

## 2021-01-25 DIAGNOSIS — M810 Age-related osteoporosis without current pathological fracture: Secondary | ICD-10-CM | POA: Diagnosis not present

## 2021-01-25 DIAGNOSIS — H409 Unspecified glaucoma: Secondary | ICD-10-CM | POA: Diagnosis not present

## 2021-01-25 DIAGNOSIS — K0510 Chronic gingivitis, plaque induced: Secondary | ICD-10-CM | POA: Diagnosis not present

## 2021-01-25 DIAGNOSIS — C50912 Malignant neoplasm of unspecified site of left female breast: Secondary | ICD-10-CM | POA: Diagnosis not present

## 2021-01-25 DIAGNOSIS — Z8673 Personal history of transient ischemic attack (TIA), and cerebral infarction without residual deficits: Secondary | ICD-10-CM | POA: Diagnosis not present

## 2021-01-25 DIAGNOSIS — R222 Localized swelling, mass and lump, trunk: Secondary | ICD-10-CM | POA: Diagnosis not present

## 2021-01-25 DIAGNOSIS — R296 Repeated falls: Secondary | ICD-10-CM | POA: Diagnosis not present

## 2021-01-25 DIAGNOSIS — F419 Anxiety disorder, unspecified: Secondary | ICD-10-CM | POA: Diagnosis not present

## 2021-01-25 DIAGNOSIS — K219 Gastro-esophageal reflux disease without esophagitis: Secondary | ICD-10-CM | POA: Diagnosis not present

## 2021-01-25 DIAGNOSIS — E43 Unspecified severe protein-calorie malnutrition: Secondary | ICD-10-CM | POA: Diagnosis not present

## 2021-01-25 DIAGNOSIS — Z87891 Personal history of nicotine dependence: Secondary | ICD-10-CM | POA: Diagnosis not present

## 2021-01-26 DIAGNOSIS — E43 Unspecified severe protein-calorie malnutrition: Secondary | ICD-10-CM | POA: Diagnosis not present

## 2021-01-26 DIAGNOSIS — R222 Localized swelling, mass and lump, trunk: Secondary | ICD-10-CM | POA: Diagnosis not present

## 2021-01-26 DIAGNOSIS — R296 Repeated falls: Secondary | ICD-10-CM | POA: Diagnosis not present

## 2021-01-26 DIAGNOSIS — Z8673 Personal history of transient ischemic attack (TIA), and cerebral infarction without residual deficits: Secondary | ICD-10-CM | POA: Diagnosis not present

## 2021-01-26 DIAGNOSIS — E119 Type 2 diabetes mellitus without complications: Secondary | ICD-10-CM | POA: Diagnosis not present

## 2021-01-26 DIAGNOSIS — C50912 Malignant neoplasm of unspecified site of left female breast: Secondary | ICD-10-CM | POA: Diagnosis not present

## 2021-01-28 DIAGNOSIS — E119 Type 2 diabetes mellitus without complications: Secondary | ICD-10-CM | POA: Diagnosis not present

## 2021-01-28 DIAGNOSIS — Z8673 Personal history of transient ischemic attack (TIA), and cerebral infarction without residual deficits: Secondary | ICD-10-CM | POA: Diagnosis not present

## 2021-01-28 DIAGNOSIS — E43 Unspecified severe protein-calorie malnutrition: Secondary | ICD-10-CM | POA: Diagnosis not present

## 2021-01-28 DIAGNOSIS — R222 Localized swelling, mass and lump, trunk: Secondary | ICD-10-CM | POA: Diagnosis not present

## 2021-01-28 DIAGNOSIS — C50912 Malignant neoplasm of unspecified site of left female breast: Secondary | ICD-10-CM | POA: Diagnosis not present

## 2021-01-28 DIAGNOSIS — R296 Repeated falls: Secondary | ICD-10-CM | POA: Diagnosis not present

## 2021-01-29 DIAGNOSIS — Z8673 Personal history of transient ischemic attack (TIA), and cerebral infarction without residual deficits: Secondary | ICD-10-CM | POA: Diagnosis not present

## 2021-01-29 DIAGNOSIS — E119 Type 2 diabetes mellitus without complications: Secondary | ICD-10-CM | POA: Diagnosis not present

## 2021-01-29 DIAGNOSIS — E43 Unspecified severe protein-calorie malnutrition: Secondary | ICD-10-CM | POA: Diagnosis not present

## 2021-01-29 DIAGNOSIS — C50912 Malignant neoplasm of unspecified site of left female breast: Secondary | ICD-10-CM | POA: Diagnosis not present

## 2021-01-29 DIAGNOSIS — R296 Repeated falls: Secondary | ICD-10-CM | POA: Diagnosis not present

## 2021-01-29 DIAGNOSIS — R222 Localized swelling, mass and lump, trunk: Secondary | ICD-10-CM | POA: Diagnosis not present

## 2021-01-31 DIAGNOSIS — C50912 Malignant neoplasm of unspecified site of left female breast: Secondary | ICD-10-CM | POA: Diagnosis not present

## 2021-01-31 DIAGNOSIS — E119 Type 2 diabetes mellitus without complications: Secondary | ICD-10-CM | POA: Diagnosis not present

## 2021-01-31 DIAGNOSIS — R296 Repeated falls: Secondary | ICD-10-CM | POA: Diagnosis not present

## 2021-01-31 DIAGNOSIS — E43 Unspecified severe protein-calorie malnutrition: Secondary | ICD-10-CM | POA: Diagnosis not present

## 2021-01-31 DIAGNOSIS — R222 Localized swelling, mass and lump, trunk: Secondary | ICD-10-CM | POA: Diagnosis not present

## 2021-01-31 DIAGNOSIS — Z8673 Personal history of transient ischemic attack (TIA), and cerebral infarction without residual deficits: Secondary | ICD-10-CM | POA: Diagnosis not present

## 2021-02-01 DIAGNOSIS — C50912 Malignant neoplasm of unspecified site of left female breast: Secondary | ICD-10-CM | POA: Diagnosis not present

## 2021-02-01 DIAGNOSIS — R222 Localized swelling, mass and lump, trunk: Secondary | ICD-10-CM | POA: Diagnosis not present

## 2021-02-01 DIAGNOSIS — E119 Type 2 diabetes mellitus without complications: Secondary | ICD-10-CM | POA: Diagnosis not present

## 2021-02-01 DIAGNOSIS — E43 Unspecified severe protein-calorie malnutrition: Secondary | ICD-10-CM | POA: Diagnosis not present

## 2021-02-01 DIAGNOSIS — Z8673 Personal history of transient ischemic attack (TIA), and cerebral infarction without residual deficits: Secondary | ICD-10-CM | POA: Diagnosis not present

## 2021-02-01 DIAGNOSIS — R296 Repeated falls: Secondary | ICD-10-CM | POA: Diagnosis not present

## 2021-02-02 DIAGNOSIS — R296 Repeated falls: Secondary | ICD-10-CM | POA: Diagnosis not present

## 2021-02-02 DIAGNOSIS — C50912 Malignant neoplasm of unspecified site of left female breast: Secondary | ICD-10-CM | POA: Diagnosis not present

## 2021-02-02 DIAGNOSIS — E43 Unspecified severe protein-calorie malnutrition: Secondary | ICD-10-CM | POA: Diagnosis not present

## 2021-02-02 DIAGNOSIS — Z8673 Personal history of transient ischemic attack (TIA), and cerebral infarction without residual deficits: Secondary | ICD-10-CM | POA: Diagnosis not present

## 2021-02-02 DIAGNOSIS — R222 Localized swelling, mass and lump, trunk: Secondary | ICD-10-CM | POA: Diagnosis not present

## 2021-02-02 DIAGNOSIS — E119 Type 2 diabetes mellitus without complications: Secondary | ICD-10-CM | POA: Diagnosis not present

## 2021-02-03 DIAGNOSIS — E43 Unspecified severe protein-calorie malnutrition: Secondary | ICD-10-CM | POA: Diagnosis not present

## 2021-02-03 DIAGNOSIS — R222 Localized swelling, mass and lump, trunk: Secondary | ICD-10-CM | POA: Diagnosis not present

## 2021-02-03 DIAGNOSIS — Z8673 Personal history of transient ischemic attack (TIA), and cerebral infarction without residual deficits: Secondary | ICD-10-CM | POA: Diagnosis not present

## 2021-02-03 DIAGNOSIS — R296 Repeated falls: Secondary | ICD-10-CM | POA: Diagnosis not present

## 2021-02-03 DIAGNOSIS — E119 Type 2 diabetes mellitus without complications: Secondary | ICD-10-CM | POA: Diagnosis not present

## 2021-02-03 DIAGNOSIS — C50912 Malignant neoplasm of unspecified site of left female breast: Secondary | ICD-10-CM | POA: Diagnosis not present

## 2021-02-04 DIAGNOSIS — C50912 Malignant neoplasm of unspecified site of left female breast: Secondary | ICD-10-CM | POA: Diagnosis not present

## 2021-02-04 DIAGNOSIS — E43 Unspecified severe protein-calorie malnutrition: Secondary | ICD-10-CM | POA: Diagnosis not present

## 2021-02-04 DIAGNOSIS — Z8673 Personal history of transient ischemic attack (TIA), and cerebral infarction without residual deficits: Secondary | ICD-10-CM | POA: Diagnosis not present

## 2021-02-04 DIAGNOSIS — E119 Type 2 diabetes mellitus without complications: Secondary | ICD-10-CM | POA: Diagnosis not present

## 2021-02-04 DIAGNOSIS — R296 Repeated falls: Secondary | ICD-10-CM | POA: Diagnosis not present

## 2021-02-04 DIAGNOSIS — R222 Localized swelling, mass and lump, trunk: Secondary | ICD-10-CM | POA: Diagnosis not present

## 2021-02-05 DIAGNOSIS — R222 Localized swelling, mass and lump, trunk: Secondary | ICD-10-CM | POA: Diagnosis not present

## 2021-02-05 DIAGNOSIS — R296 Repeated falls: Secondary | ICD-10-CM | POA: Diagnosis not present

## 2021-02-05 DIAGNOSIS — Z8673 Personal history of transient ischemic attack (TIA), and cerebral infarction without residual deficits: Secondary | ICD-10-CM | POA: Diagnosis not present

## 2021-02-05 DIAGNOSIS — E43 Unspecified severe protein-calorie malnutrition: Secondary | ICD-10-CM | POA: Diagnosis not present

## 2021-02-05 DIAGNOSIS — C50912 Malignant neoplasm of unspecified site of left female breast: Secondary | ICD-10-CM | POA: Diagnosis not present

## 2021-02-05 DIAGNOSIS — E119 Type 2 diabetes mellitus without complications: Secondary | ICD-10-CM | POA: Diagnosis not present

## 2021-02-08 DIAGNOSIS — Z8673 Personal history of transient ischemic attack (TIA), and cerebral infarction without residual deficits: Secondary | ICD-10-CM | POA: Diagnosis not present

## 2021-02-08 DIAGNOSIS — R222 Localized swelling, mass and lump, trunk: Secondary | ICD-10-CM | POA: Diagnosis not present

## 2021-02-08 DIAGNOSIS — R296 Repeated falls: Secondary | ICD-10-CM | POA: Diagnosis not present

## 2021-02-08 DIAGNOSIS — E43 Unspecified severe protein-calorie malnutrition: Secondary | ICD-10-CM | POA: Diagnosis not present

## 2021-02-08 DIAGNOSIS — C50912 Malignant neoplasm of unspecified site of left female breast: Secondary | ICD-10-CM | POA: Diagnosis not present

## 2021-02-08 DIAGNOSIS — E119 Type 2 diabetes mellitus without complications: Secondary | ICD-10-CM | POA: Diagnosis not present

## 2021-02-09 DIAGNOSIS — C50912 Malignant neoplasm of unspecified site of left female breast: Secondary | ICD-10-CM | POA: Diagnosis not present

## 2021-02-09 DIAGNOSIS — E43 Unspecified severe protein-calorie malnutrition: Secondary | ICD-10-CM | POA: Diagnosis not present

## 2021-02-09 DIAGNOSIS — Z8673 Personal history of transient ischemic attack (TIA), and cerebral infarction without residual deficits: Secondary | ICD-10-CM | POA: Diagnosis not present

## 2021-02-09 DIAGNOSIS — R296 Repeated falls: Secondary | ICD-10-CM | POA: Diagnosis not present

## 2021-02-09 DIAGNOSIS — R222 Localized swelling, mass and lump, trunk: Secondary | ICD-10-CM | POA: Diagnosis not present

## 2021-02-09 DIAGNOSIS — E119 Type 2 diabetes mellitus without complications: Secondary | ICD-10-CM | POA: Diagnosis not present

## 2021-02-10 DIAGNOSIS — E119 Type 2 diabetes mellitus without complications: Secondary | ICD-10-CM | POA: Diagnosis not present

## 2021-02-10 DIAGNOSIS — Z8673 Personal history of transient ischemic attack (TIA), and cerebral infarction without residual deficits: Secondary | ICD-10-CM | POA: Diagnosis not present

## 2021-02-10 DIAGNOSIS — R222 Localized swelling, mass and lump, trunk: Secondary | ICD-10-CM | POA: Diagnosis not present

## 2021-02-10 DIAGNOSIS — C50912 Malignant neoplasm of unspecified site of left female breast: Secondary | ICD-10-CM | POA: Diagnosis not present

## 2021-02-10 DIAGNOSIS — E43 Unspecified severe protein-calorie malnutrition: Secondary | ICD-10-CM | POA: Diagnosis not present

## 2021-02-10 DIAGNOSIS — R296 Repeated falls: Secondary | ICD-10-CM | POA: Diagnosis not present

## 2021-02-11 DIAGNOSIS — R296 Repeated falls: Secondary | ICD-10-CM | POA: Diagnosis not present

## 2021-02-11 DIAGNOSIS — R222 Localized swelling, mass and lump, trunk: Secondary | ICD-10-CM | POA: Diagnosis not present

## 2021-02-11 DIAGNOSIS — C50912 Malignant neoplasm of unspecified site of left female breast: Secondary | ICD-10-CM | POA: Diagnosis not present

## 2021-02-11 DIAGNOSIS — E119 Type 2 diabetes mellitus without complications: Secondary | ICD-10-CM | POA: Diagnosis not present

## 2021-02-11 DIAGNOSIS — E43 Unspecified severe protein-calorie malnutrition: Secondary | ICD-10-CM | POA: Diagnosis not present

## 2021-02-11 DIAGNOSIS — Z8673 Personal history of transient ischemic attack (TIA), and cerebral infarction without residual deficits: Secondary | ICD-10-CM | POA: Diagnosis not present

## 2021-02-12 DIAGNOSIS — R296 Repeated falls: Secondary | ICD-10-CM | POA: Diagnosis not present

## 2021-02-12 DIAGNOSIS — C50912 Malignant neoplasm of unspecified site of left female breast: Secondary | ICD-10-CM | POA: Diagnosis not present

## 2021-02-12 DIAGNOSIS — E119 Type 2 diabetes mellitus without complications: Secondary | ICD-10-CM | POA: Diagnosis not present

## 2021-02-12 DIAGNOSIS — R222 Localized swelling, mass and lump, trunk: Secondary | ICD-10-CM | POA: Diagnosis not present

## 2021-02-12 DIAGNOSIS — E43 Unspecified severe protein-calorie malnutrition: Secondary | ICD-10-CM | POA: Diagnosis not present

## 2021-02-12 DIAGNOSIS — Z8673 Personal history of transient ischemic attack (TIA), and cerebral infarction without residual deficits: Secondary | ICD-10-CM | POA: Diagnosis not present

## 2021-02-15 DIAGNOSIS — R222 Localized swelling, mass and lump, trunk: Secondary | ICD-10-CM | POA: Diagnosis not present

## 2021-02-15 DIAGNOSIS — C50912 Malignant neoplasm of unspecified site of left female breast: Secondary | ICD-10-CM | POA: Diagnosis not present

## 2021-02-15 DIAGNOSIS — R296 Repeated falls: Secondary | ICD-10-CM | POA: Diagnosis not present

## 2021-02-15 DIAGNOSIS — E119 Type 2 diabetes mellitus without complications: Secondary | ICD-10-CM | POA: Diagnosis not present

## 2021-02-15 DIAGNOSIS — E43 Unspecified severe protein-calorie malnutrition: Secondary | ICD-10-CM | POA: Diagnosis not present

## 2021-02-15 DIAGNOSIS — Z8673 Personal history of transient ischemic attack (TIA), and cerebral infarction without residual deficits: Secondary | ICD-10-CM | POA: Diagnosis not present

## 2021-02-17 DIAGNOSIS — R222 Localized swelling, mass and lump, trunk: Secondary | ICD-10-CM | POA: Diagnosis not present

## 2021-02-17 DIAGNOSIS — R296 Repeated falls: Secondary | ICD-10-CM | POA: Diagnosis not present

## 2021-02-17 DIAGNOSIS — E43 Unspecified severe protein-calorie malnutrition: Secondary | ICD-10-CM | POA: Diagnosis not present

## 2021-02-17 DIAGNOSIS — Z8673 Personal history of transient ischemic attack (TIA), and cerebral infarction without residual deficits: Secondary | ICD-10-CM | POA: Diagnosis not present

## 2021-02-17 DIAGNOSIS — E119 Type 2 diabetes mellitus without complications: Secondary | ICD-10-CM | POA: Diagnosis not present

## 2021-02-17 DIAGNOSIS — C50912 Malignant neoplasm of unspecified site of left female breast: Secondary | ICD-10-CM | POA: Diagnosis not present

## 2021-02-22 DIAGNOSIS — R222 Localized swelling, mass and lump, trunk: Secondary | ICD-10-CM | POA: Diagnosis not present

## 2021-02-22 DIAGNOSIS — Z8673 Personal history of transient ischemic attack (TIA), and cerebral infarction without residual deficits: Secondary | ICD-10-CM | POA: Diagnosis not present

## 2021-02-22 DIAGNOSIS — C50912 Malignant neoplasm of unspecified site of left female breast: Secondary | ICD-10-CM | POA: Diagnosis not present

## 2021-02-22 DIAGNOSIS — E43 Unspecified severe protein-calorie malnutrition: Secondary | ICD-10-CM | POA: Diagnosis not present

## 2021-02-22 DIAGNOSIS — R296 Repeated falls: Secondary | ICD-10-CM | POA: Diagnosis not present

## 2021-02-22 DIAGNOSIS — E119 Type 2 diabetes mellitus without complications: Secondary | ICD-10-CM | POA: Diagnosis not present

## 2021-02-25 DIAGNOSIS — E119 Type 2 diabetes mellitus without complications: Secondary | ICD-10-CM | POA: Diagnosis not present

## 2021-02-25 DIAGNOSIS — E43 Unspecified severe protein-calorie malnutrition: Secondary | ICD-10-CM | POA: Diagnosis not present

## 2021-02-25 DIAGNOSIS — K219 Gastro-esophageal reflux disease without esophagitis: Secondary | ICD-10-CM | POA: Diagnosis not present

## 2021-02-25 DIAGNOSIS — K0510 Chronic gingivitis, plaque induced: Secondary | ICD-10-CM | POA: Diagnosis not present

## 2021-02-25 DIAGNOSIS — R296 Repeated falls: Secondary | ICD-10-CM | POA: Diagnosis not present

## 2021-02-25 DIAGNOSIS — C50912 Malignant neoplasm of unspecified site of left female breast: Secondary | ICD-10-CM | POA: Diagnosis not present

## 2021-02-25 DIAGNOSIS — H409 Unspecified glaucoma: Secondary | ICD-10-CM | POA: Diagnosis not present

## 2021-02-25 DIAGNOSIS — M810 Age-related osteoporosis without current pathological fracture: Secondary | ICD-10-CM | POA: Diagnosis not present

## 2021-02-25 DIAGNOSIS — R222 Localized swelling, mass and lump, trunk: Secondary | ICD-10-CM | POA: Diagnosis not present

## 2021-02-25 DIAGNOSIS — Z87891 Personal history of nicotine dependence: Secondary | ICD-10-CM | POA: Diagnosis not present

## 2021-02-25 DIAGNOSIS — Z8673 Personal history of transient ischemic attack (TIA), and cerebral infarction without residual deficits: Secondary | ICD-10-CM | POA: Diagnosis not present

## 2021-02-25 DIAGNOSIS — F419 Anxiety disorder, unspecified: Secondary | ICD-10-CM | POA: Diagnosis not present

## 2021-03-02 DIAGNOSIS — E43 Unspecified severe protein-calorie malnutrition: Secondary | ICD-10-CM | POA: Diagnosis not present

## 2021-03-02 DIAGNOSIS — E119 Type 2 diabetes mellitus without complications: Secondary | ICD-10-CM | POA: Diagnosis not present

## 2021-03-02 DIAGNOSIS — R296 Repeated falls: Secondary | ICD-10-CM | POA: Diagnosis not present

## 2021-03-02 DIAGNOSIS — R222 Localized swelling, mass and lump, trunk: Secondary | ICD-10-CM | POA: Diagnosis not present

## 2021-03-02 DIAGNOSIS — Z8673 Personal history of transient ischemic attack (TIA), and cerebral infarction without residual deficits: Secondary | ICD-10-CM | POA: Diagnosis not present

## 2021-03-02 DIAGNOSIS — C50912 Malignant neoplasm of unspecified site of left female breast: Secondary | ICD-10-CM | POA: Diagnosis not present

## 2021-03-04 DIAGNOSIS — E43 Unspecified severe protein-calorie malnutrition: Secondary | ICD-10-CM | POA: Diagnosis not present

## 2021-03-04 DIAGNOSIS — Z8673 Personal history of transient ischemic attack (TIA), and cerebral infarction without residual deficits: Secondary | ICD-10-CM | POA: Diagnosis not present

## 2021-03-04 DIAGNOSIS — E119 Type 2 diabetes mellitus without complications: Secondary | ICD-10-CM | POA: Diagnosis not present

## 2021-03-04 DIAGNOSIS — R222 Localized swelling, mass and lump, trunk: Secondary | ICD-10-CM | POA: Diagnosis not present

## 2021-03-04 DIAGNOSIS — R296 Repeated falls: Secondary | ICD-10-CM | POA: Diagnosis not present

## 2021-03-04 DIAGNOSIS — C50912 Malignant neoplasm of unspecified site of left female breast: Secondary | ICD-10-CM | POA: Diagnosis not present

## 2021-03-05 DIAGNOSIS — R296 Repeated falls: Secondary | ICD-10-CM | POA: Diagnosis not present

## 2021-03-05 DIAGNOSIS — R222 Localized swelling, mass and lump, trunk: Secondary | ICD-10-CM | POA: Diagnosis not present

## 2021-03-05 DIAGNOSIS — E43 Unspecified severe protein-calorie malnutrition: Secondary | ICD-10-CM | POA: Diagnosis not present

## 2021-03-05 DIAGNOSIS — Z8673 Personal history of transient ischemic attack (TIA), and cerebral infarction without residual deficits: Secondary | ICD-10-CM | POA: Diagnosis not present

## 2021-03-05 DIAGNOSIS — E119 Type 2 diabetes mellitus without complications: Secondary | ICD-10-CM | POA: Diagnosis not present

## 2021-03-05 DIAGNOSIS — C50912 Malignant neoplasm of unspecified site of left female breast: Secondary | ICD-10-CM | POA: Diagnosis not present

## 2021-03-12 DIAGNOSIS — Z8673 Personal history of transient ischemic attack (TIA), and cerebral infarction without residual deficits: Secondary | ICD-10-CM | POA: Diagnosis not present

## 2021-03-12 DIAGNOSIS — E43 Unspecified severe protein-calorie malnutrition: Secondary | ICD-10-CM | POA: Diagnosis not present

## 2021-03-12 DIAGNOSIS — R222 Localized swelling, mass and lump, trunk: Secondary | ICD-10-CM | POA: Diagnosis not present

## 2021-03-12 DIAGNOSIS — R296 Repeated falls: Secondary | ICD-10-CM | POA: Diagnosis not present

## 2021-03-12 DIAGNOSIS — C50912 Malignant neoplasm of unspecified site of left female breast: Secondary | ICD-10-CM | POA: Diagnosis not present

## 2021-03-12 DIAGNOSIS — E119 Type 2 diabetes mellitus without complications: Secondary | ICD-10-CM | POA: Diagnosis not present

## 2021-03-15 ENCOUNTER — Other Ambulatory Visit: Payer: Self-pay | Admitting: Adult Health

## 2021-03-15 ENCOUNTER — Encounter: Payer: Self-pay | Admitting: Adult Health

## 2021-03-15 ENCOUNTER — Non-Acute Institutional Stay: Payer: Medicare Other | Admitting: Adult Health

## 2021-03-15 DIAGNOSIS — R634 Abnormal weight loss: Secondary | ICD-10-CM | POA: Diagnosis not present

## 2021-03-15 DIAGNOSIS — H548 Legal blindness, as defined in USA: Secondary | ICD-10-CM | POA: Diagnosis not present

## 2021-03-15 DIAGNOSIS — K5903 Drug induced constipation: Secondary | ICD-10-CM | POA: Diagnosis not present

## 2021-03-15 DIAGNOSIS — L89102 Pressure ulcer of unspecified part of back, stage 2: Secondary | ICD-10-CM | POA: Diagnosis not present

## 2021-03-15 DIAGNOSIS — C50912 Malignant neoplasm of unspecified site of left female breast: Secondary | ICD-10-CM | POA: Diagnosis not present

## 2021-03-15 DIAGNOSIS — G8929 Other chronic pain: Secondary | ICD-10-CM

## 2021-03-15 DIAGNOSIS — M546 Pain in thoracic spine: Secondary | ICD-10-CM

## 2021-03-15 MED ORDER — MORPHINE SULFATE (CONCENTRATE) 20 MG/ML PO SOLN
5.0000 mg | ORAL | 0 refills | Status: AC
Start: 2021-03-15 — End: ?

## 2021-03-15 MED ORDER — MORPHINE SULFATE (CONCENTRATE) 20 MG/ML PO SOLN
5.0000 mg | ORAL | 0 refills | Status: AC | PRN
Start: 2021-03-15 — End: ?

## 2021-03-15 NOTE — Progress Notes (Signed)
Nurse from [facility] notified me that prescription refills for this resident for end of life were not sent by hospice and they asked me to fill the prescriptions.

## 2021-03-15 NOTE — Progress Notes (Signed)
Location:  Occupational psychologist of Service:  ALF [phone]) Provider:  Royal Hawthorn, NP  Patient Care Team: Virgie Dad, MD as PCP - General (Internal Medicine) Gerarda Fraction, MD as Referring Physician (Ophthalmology)  Extended Emergency Contact Information Primary Emergency Contact: Andrez Grime, [place] [address] Johnnette Litter of [place] Phone: [phone] Relation: Neighbor  Code Status:  DNR Goals of care: Advanced Directive information Advanced Directives 03/15/2021  Does Patient Have a Medical Advance Directive? Yes  Type of Paramedic of [place];Living will;Out of facility DNR (pink MOST or yellow form)  Does patient want to make changes to medical advance directive? -  Copy of [place] in Chart? Yes - validated most recent copy scanned in chart (See row information)  Pre-existing out of facility DNR order (yellow form or pink MOST form) Yellow form placed in chart (order not valid for inpatient use);Pink MOST form placed in chart (order not valid for inpatient use)     Chief Complaint  Patient presents with   Medical Management of Chronic Issues    HPI:  Pt is a 87 y.o. female seen today for medical management of chronic diseases.   PMH significant for macular degeneration with blindiness, CVA with facial droop, breast cancer s/p left total mastectomy 2020 and left chest wall mass excision 03/11/20.    She is followed by hospice and is at the end of life. She is not eating and drinking and has lost a significant amount of weight. She is no longer taking boost. She has an area of pressure to the thoracic spine with dressing changes performed by the staff at [facility]. They also apply skin prep to her heals. She also has a wound to her left chest wall due to ca and there is a polymem applied to this area. She remains able to f/c and communicate but spends most of the day sleeping. The staff  report her pain is well controlled with roxanol. She denied pain during the visit. She is also on haldol for agitation which has been helpful. Her weight and vitals are no longer taken due to her goals of care. She has 24/hr care in the AL environment.   Past Medical History:  Diagnosis Date   Anxiety    Arthritis    Back pain    Borderline diabetes    Compression fracture of lumbar vertebra ([place]) 2013   Constipation    Displaced intertrochanteric fracture of left femur (HCC)    Fracture of unspecified part of neck of unspecified femur, initial encounter for closed fracture (HCC)    GERD (gastroesophageal reflux disease)    Hyperglycemia    Hypo-osmolality and hyponatremia    Legal blindness    Macular degeneration    both eyes, blind   Osteoporosis    Recurrent breast cancer, left [facility])    Past Surgical History:  Procedure Laterality Date   CATARACT EXTRACTION     HIP FRACTURE SURGERY Left 2016   INTRAMEDULLARY (IM) NAIL INTERTROCHANTERIC Left 03/24/2015   Procedure: INTRAMEDULLARY (IM) NAIL INTERTROCHANTRIC;  Surgeon: Rod Can, MD;  Location: [place];  Service: Orthopedics;  Laterality: Left;   MASS EXCISION Left 03/11/2020   Procedure: EXCISION CHEST WALL MASS;  Surgeon: Rolm Bookbinder, MD;  Location: [place];  Service: General;  Laterality: Left;  LOCAL   MASTECTOMY WITH AXILLARY LYMPH NODE DISSECTION Left 03/15/2019   Procedure: LEFT TOTAL MASTECTOMY  WITH REMOVAL OF INVOLVED LYMPH NODES;  Surgeon: Fanny Skates, MD;  Location: [place];  Service: General;  Laterality: Left;   TONSILLECTOMY      No Known Allergies  Outpatient Encounter Medications as of 03/15/2021  Medication Sig   bisacodyl (DULCOLAX) 10 MG suppository Place 10 mg rectally 3 (three) times a week. Sunday, Wednesday, Friday   bisacodyl (DULCOLAX) 5 MG EC tablet Take 10 mg by mouth at bedtime.   haloperidol (HALDOL) 5 MG tablet Take 2.5 mg by mouth every 6 (six) hours.   morphine  (ROXANOL) 20 MG/ML concentrated solution Take 0.25 mLs (5 mg total) by mouth every 4 (four) hours.   morphine (ROXANOL) 20 MG/ML concentrated solution Take 0.25 mLs (5 mg total) by mouth every 2 (two) hours as needed for severe pain.   polyethylene glycol (MIRALAX / GLYCOLAX) 17 g packet Take 17 g by mouth daily.   [DISCONTINUED] acetaminophen (TYLENOL) 500 MG tablet Take 1,000 mg by mouth in the morning, at noon, and at bedtime. For pain.   [DISCONTINUED] aspirin EC 81 MG tablet Take 81 mg by mouth daily. Swallow whole.   [DISCONTINUED] chlorhexidine (PERIDEX) 0.12 % solution Use as directed 5 mLs in the mouth or throat daily.   [DISCONTINUED] dorzolamide-timolol (COSOPT) 22.3-6.8 MG/ML ophthalmic solution Place 1 drop into the right eye 2 (two) times daily.   [DISCONTINUED] feeding supplement (BOOST / RESOURCE BREEZE) LIQD Take 1 Container by mouth daily.   [DISCONTINUED] Travoprost, BAK Free, (TRAVATAN) 0.004 % SOLN ophthalmic solution INSTILL 1 DROP IN THE RIGHT EYE AT BEDTIME - MAY SELF ADMINISTER   No facility-administered encounter medications on file as of 03/15/2021.    Review of Systems  Constitutional:  Positive for activity change and appetite change. Negative for chills, diaphoresis, fatigue, fever and unexpected weight change.  HENT:  Negative for congestion.   Respiratory:  Negative for cough, shortness of breath and wheezing.   Cardiovascular:  Negative for chest pain, palpitations and leg swelling.  Gastrointestinal:  Negative for abdominal distention, abdominal pain, constipation and diarrhea.  Genitourinary:  Negative for difficulty urinating and dysuria.  Musculoskeletal:  Positive for back pain and gait problem. Negative for arthralgias, joint swelling and myalgias.  Skin:  Positive for wound.  Neurological:  Negative for dizziness, tremors, seizures, syncope, facial asymmetry, speech difficulty, weakness, light-headedness, numbness and headaches.  Psychiatric/Behavioral:   Positive for confusion and dysphoric mood. Negative for agitation and behavioral problems.    Immunization History  Administered Date(s) Administered   Influenza Inj Mdck Quad Pf 04/14/2016   Influenza Split 04/08/2013   Influenza, High Dose Seasonal PF 04/17/2020   Influenza,inj,Quad PF,6+ Mos 03/26/2015, 04/17/2018   Influenza-Unspecified 04/08/2014, 04/14/2016, 04/17/2017, 04/24/2019   Moderna SARS-COV2 Booster Vaccination 05/09/2020   Moderna Sars-Covid-2 Vaccination 07/08/2019, 08/06/2019   Pneumococcal Conjugate-13 06/06/2014   Pneumococcal Polysaccharide-23 05/02/2018   Zoster Recombinat (Shingrix) 07/21/2017, 10/03/2017   Zoster, Live 07/09/2012   Pertinent  Health Maintenance Due  Topic Date Due   URINE MICROALBUMIN  Never done   INFLUENZA VACCINE  01/25/2021   DEXA SCAN  10/25/2023 (Originally 12/17/1998)   Fall Risk  05/08/2020 05/06/2020 01/08/2020 07/31/2019 05/01/2019  Falls in the past year? - 0 0 0 0  Number falls in past yr: - 0 0 0 0  Injury with Fall? - 0 0 - 0  Risk for fall due to : Impaired vision;History of fall(s) - - - -  Follow up Education provided;Falls evaluation completed;Falls prevention discussed - Falls evaluation completed - -  Functional Status Survey:    There were no vitals filed for this visit.  There is no height or weight on file to calculate BMI.  Wt Readings from Last 3 Encounters:  11/26/20 83 lb 9.6 oz (37.9 kg)  11/12/20 88 lb (39.9 kg)  09/01/20 94 lb (42.6 kg)    Physical Exam Vitals and nursing note reviewed.  Constitutional:      General: She is not in acute distress.    Appearance: She is not diaphoretic.     Comments: Frail thin appearing  HENT:     Head: Normocephalic and atraumatic.  Neck:     Vascular: No JVD.  Cardiovascular:     Rate and Rhythm: Normal rate and regular rhythm.     Heart sounds: No murmur heard. Pulmonary:     Effort: Pulmonary effort is normal. No respiratory distress.     Breath sounds:  Normal breath sounds. No wheezing.  Chest:  Breasts:    Left: Mass (raise area to lef tupper outer quad with mild erythema. ) present.  Musculoskeletal:        General: No swelling, tenderness, deformity (kyphotic) or signs of injury.     Right lower leg: No edema.     Left lower leg: No edema.  Skin:    General: Skin is warm and dry.  Neurological:     Mental Status: She is alert and oriented to person, place, and time.     Comments: Right facial droop    Labs reviewed: No results for input(s): NA, K, CL, CO2, GLUCOSE, BUN, CREATININE, CALCIUM, MG, PHOS in the last 8760 hours.  No results for input(s): AST, ALT, ALKPHOS, BILITOT, PROT, ALBUMIN in the last 8760 hours.  No results for input(s): WBC, NEUTROABS, HGB, HCT, MCV, PLT in the last 8760 hours.  No results found for: TSH Lab Results  Component Value Date   HGBA1C 6.2 01/02/2020   Lab Results  Component Value Date   CHOL 159 04/26/2019   HDL 67 04/26/2019   LDLCALC 82 04/26/2019   TRIG 49 04/26/2019    Significant Diagnostic Results in last 30 days:  No results found.  Assessment/Plan  1. Invasive ductal carcinoma of breast, female, left ([place]) Recurrence with wound to the chest wall, weight loss, and decline.  Followed by hospice. Pain is well controlled.  End of life, appears comfortable  2. Pressure injury of back, stage 2 (HCC) Dressing changes with polymem silver and tegaderm weekly per WS  Pressure relief mattress.   3. Chronic midline thoracic back pain Controlled with roxanol  4. Legal blindness Led to assisted living status  5. Weight loss Due to decreased intake and metastatic ca  6. Constipation Continue biscodyl orally each evening and supp three times weekly.   Family/ staff Communication: discussed with the resident and her caregiver  Labs/tests ordered:  Na due to goals of care

## 2021-03-17 DIAGNOSIS — R296 Repeated falls: Secondary | ICD-10-CM | POA: Diagnosis not present

## 2021-03-17 DIAGNOSIS — C50912 Malignant neoplasm of unspecified site of left female breast: Secondary | ICD-10-CM | POA: Diagnosis not present

## 2021-03-17 DIAGNOSIS — R222 Localized swelling, mass and lump, trunk: Secondary | ICD-10-CM | POA: Diagnosis not present

## 2021-03-17 DIAGNOSIS — E119 Type 2 diabetes mellitus without complications: Secondary | ICD-10-CM | POA: Diagnosis not present

## 2021-03-17 DIAGNOSIS — Z8673 Personal history of transient ischemic attack (TIA), and cerebral infarction without residual deficits: Secondary | ICD-10-CM | POA: Diagnosis not present

## 2021-03-17 DIAGNOSIS — E43 Unspecified severe protein-calorie malnutrition: Secondary | ICD-10-CM | POA: Diagnosis not present

## 2021-03-21 DIAGNOSIS — C50912 Malignant neoplasm of unspecified site of left female breast: Secondary | ICD-10-CM | POA: Diagnosis not present

## 2021-03-21 DIAGNOSIS — E119 Type 2 diabetes mellitus without complications: Secondary | ICD-10-CM | POA: Diagnosis not present

## 2021-03-21 DIAGNOSIS — R296 Repeated falls: Secondary | ICD-10-CM | POA: Diagnosis not present

## 2021-03-21 DIAGNOSIS — R222 Localized swelling, mass and lump, trunk: Secondary | ICD-10-CM | POA: Diagnosis not present

## 2021-03-21 DIAGNOSIS — Z8673 Personal history of transient ischemic attack (TIA), and cerebral infarction without residual deficits: Secondary | ICD-10-CM | POA: Diagnosis not present

## 2021-03-21 DIAGNOSIS — E43 Unspecified severe protein-calorie malnutrition: Secondary | ICD-10-CM | POA: Diagnosis not present

## 2021-03-22 DIAGNOSIS — Z8673 Personal history of transient ischemic attack (TIA), and cerebral infarction without residual deficits: Secondary | ICD-10-CM | POA: Diagnosis not present

## 2021-03-22 DIAGNOSIS — R296 Repeated falls: Secondary | ICD-10-CM | POA: Diagnosis not present

## 2021-03-22 DIAGNOSIS — R222 Localized swelling, mass and lump, trunk: Secondary | ICD-10-CM | POA: Diagnosis not present

## 2021-03-22 DIAGNOSIS — C50912 Malignant neoplasm of unspecified site of left female breast: Secondary | ICD-10-CM | POA: Diagnosis not present

## 2021-03-22 DIAGNOSIS — E119 Type 2 diabetes mellitus without complications: Secondary | ICD-10-CM | POA: Diagnosis not present

## 2021-03-22 DIAGNOSIS — E43 Unspecified severe protein-calorie malnutrition: Secondary | ICD-10-CM | POA: Diagnosis not present

## 2021-03-24 DIAGNOSIS — E119 Type 2 diabetes mellitus without complications: Secondary | ICD-10-CM | POA: Diagnosis not present

## 2021-03-24 DIAGNOSIS — C50912 Malignant neoplasm of unspecified site of left female breast: Secondary | ICD-10-CM | POA: Diagnosis not present

## 2021-03-24 DIAGNOSIS — E43 Unspecified severe protein-calorie malnutrition: Secondary | ICD-10-CM | POA: Diagnosis not present

## 2021-03-24 DIAGNOSIS — Z8673 Personal history of transient ischemic attack (TIA), and cerebral infarction without residual deficits: Secondary | ICD-10-CM | POA: Diagnosis not present

## 2021-03-24 DIAGNOSIS — R222 Localized swelling, mass and lump, trunk: Secondary | ICD-10-CM | POA: Diagnosis not present

## 2021-03-24 DIAGNOSIS — R296 Repeated falls: Secondary | ICD-10-CM | POA: Diagnosis not present

## 2021-03-27 DIAGNOSIS — F419 Anxiety disorder, unspecified: Secondary | ICD-10-CM | POA: Diagnosis not present

## 2021-03-27 DIAGNOSIS — Z87891 Personal history of nicotine dependence: Secondary | ICD-10-CM | POA: Diagnosis not present

## 2021-03-27 DIAGNOSIS — M810 Age-related osteoporosis without current pathological fracture: Secondary | ICD-10-CM | POA: Diagnosis not present

## 2021-03-27 DIAGNOSIS — R222 Localized swelling, mass and lump, trunk: Secondary | ICD-10-CM | POA: Diagnosis not present

## 2021-03-27 DIAGNOSIS — K0510 Chronic gingivitis, plaque induced: Secondary | ICD-10-CM | POA: Diagnosis not present

## 2021-03-27 DIAGNOSIS — Z8673 Personal history of transient ischemic attack (TIA), and cerebral infarction without residual deficits: Secondary | ICD-10-CM | POA: Diagnosis not present

## 2021-03-27 DIAGNOSIS — R296 Repeated falls: Secondary | ICD-10-CM | POA: Diagnosis not present

## 2021-03-27 DIAGNOSIS — E119 Type 2 diabetes mellitus without complications: Secondary | ICD-10-CM | POA: Diagnosis not present

## 2021-03-27 DIAGNOSIS — K219 Gastro-esophageal reflux disease without esophagitis: Secondary | ICD-10-CM | POA: Diagnosis not present

## 2021-03-27 DIAGNOSIS — H409 Unspecified glaucoma: Secondary | ICD-10-CM | POA: Diagnosis not present

## 2021-03-27 DIAGNOSIS — E43 Unspecified severe protein-calorie malnutrition: Secondary | ICD-10-CM | POA: Diagnosis not present

## 2021-03-27 DIAGNOSIS — C50912 Malignant neoplasm of unspecified site of left female breast: Secondary | ICD-10-CM | POA: Diagnosis not present

## 2021-03-31 DIAGNOSIS — Z8673 Personal history of transient ischemic attack (TIA), and cerebral infarction without residual deficits: Secondary | ICD-10-CM | POA: Diagnosis not present

## 2021-03-31 DIAGNOSIS — R296 Repeated falls: Secondary | ICD-10-CM | POA: Diagnosis not present

## 2021-03-31 DIAGNOSIS — E43 Unspecified severe protein-calorie malnutrition: Secondary | ICD-10-CM | POA: Diagnosis not present

## 2021-03-31 DIAGNOSIS — E119 Type 2 diabetes mellitus without complications: Secondary | ICD-10-CM | POA: Diagnosis not present

## 2021-03-31 DIAGNOSIS — R222 Localized swelling, mass and lump, trunk: Secondary | ICD-10-CM | POA: Diagnosis not present

## 2021-03-31 DIAGNOSIS — C50912 Malignant neoplasm of unspecified site of left female breast: Secondary | ICD-10-CM | POA: Diagnosis not present

## 2021-04-01 DIAGNOSIS — C50912 Malignant neoplasm of unspecified site of left female breast: Secondary | ICD-10-CM | POA: Diagnosis not present

## 2021-04-01 DIAGNOSIS — R296 Repeated falls: Secondary | ICD-10-CM | POA: Diagnosis not present

## 2021-04-01 DIAGNOSIS — E43 Unspecified severe protein-calorie malnutrition: Secondary | ICD-10-CM | POA: Diagnosis not present

## 2021-04-01 DIAGNOSIS — Z8673 Personal history of transient ischemic attack (TIA), and cerebral infarction without residual deficits: Secondary | ICD-10-CM | POA: Diagnosis not present

## 2021-04-01 DIAGNOSIS — R222 Localized swelling, mass and lump, trunk: Secondary | ICD-10-CM | POA: Diagnosis not present

## 2021-04-01 DIAGNOSIS — E119 Type 2 diabetes mellitus without complications: Secondary | ICD-10-CM | POA: Diagnosis not present

## 2021-04-27 DEATH — deceased

## 2021-05-20 IMAGING — MG MM CLIP PLACEMENT
4 series · 4 of 12 positions shown · non-contrast
Comparison: Previous exam(s).

CLINICAL DATA: Status post ultrasound-guided core biopsies of a
mass in the left breast and the left axilla. Images are limited
secondary to the patient not being able to tolerate compression.

EXAM:
3D DIAGNOSTIC LEFT MAMMOGRAM POST ULTRASOUND GUIDED BIOPSIES

[L MLO synth-2D]
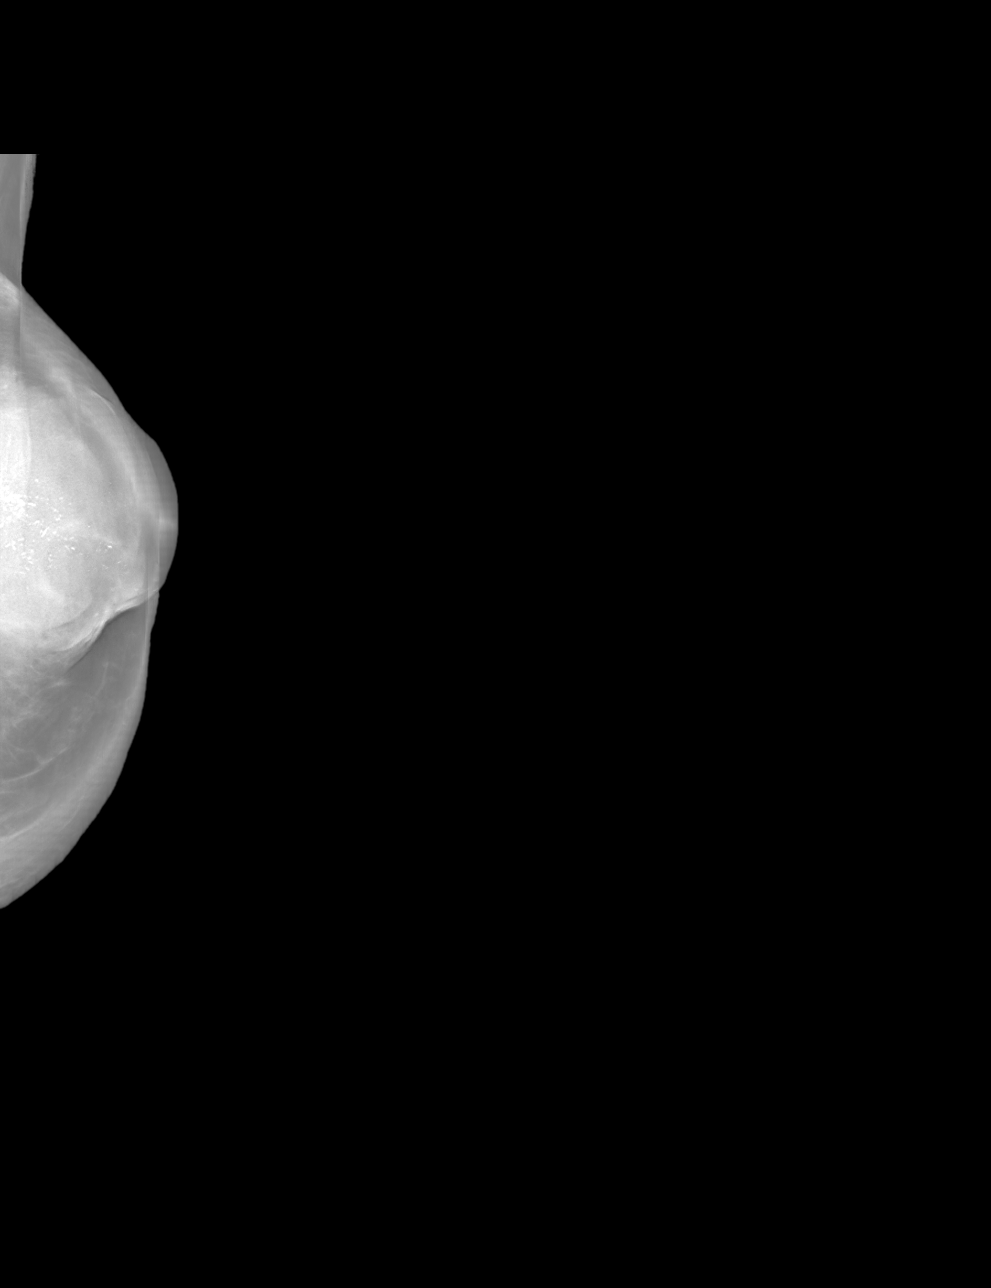

[L CC synth-2D]
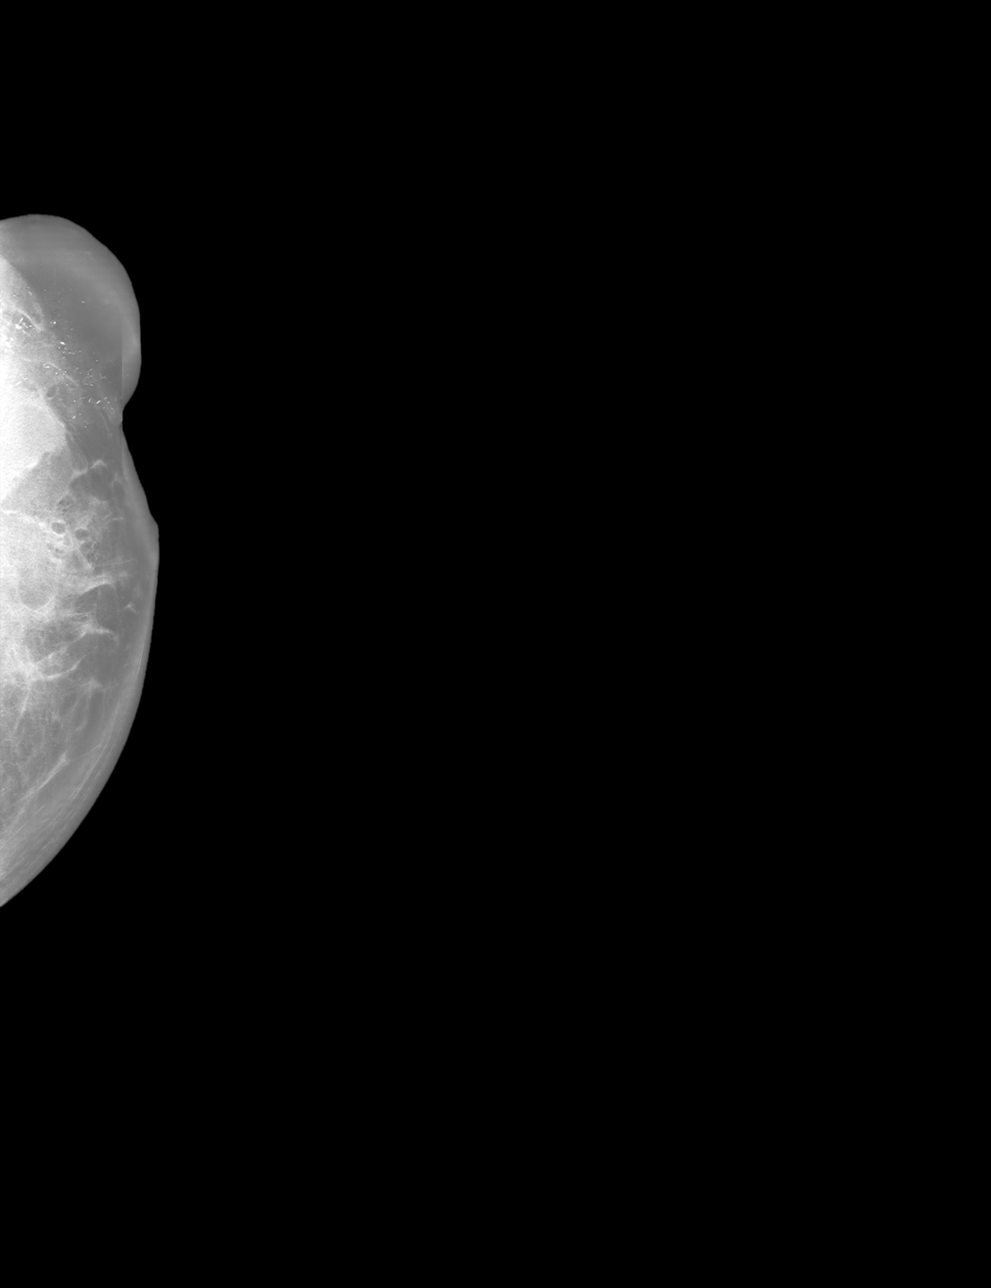

[L CC tomo · tomo slice 41/80.0]
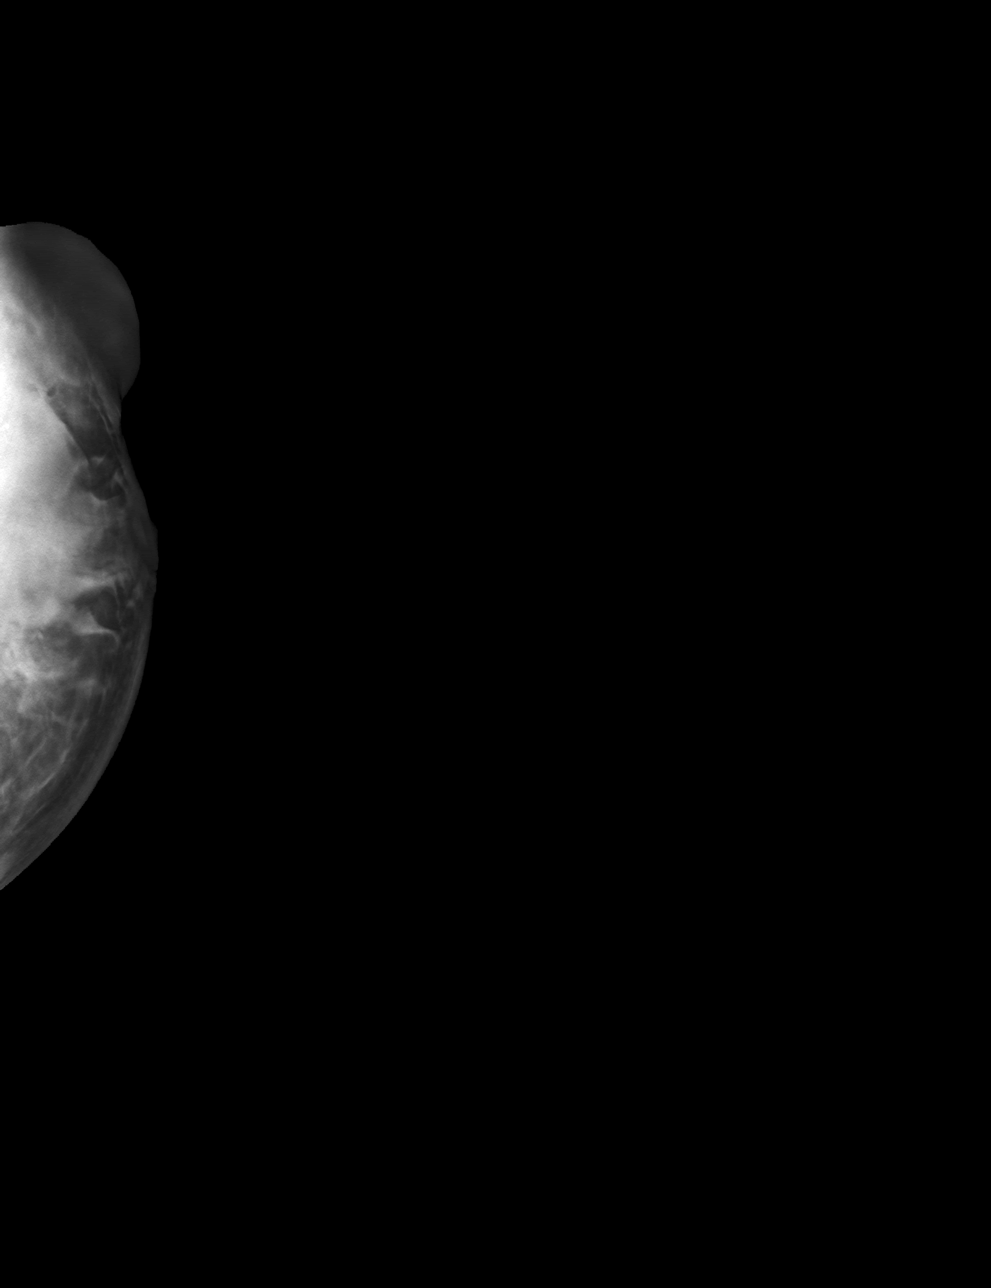

[L MLO tomo · tomo slice 50/99.0]
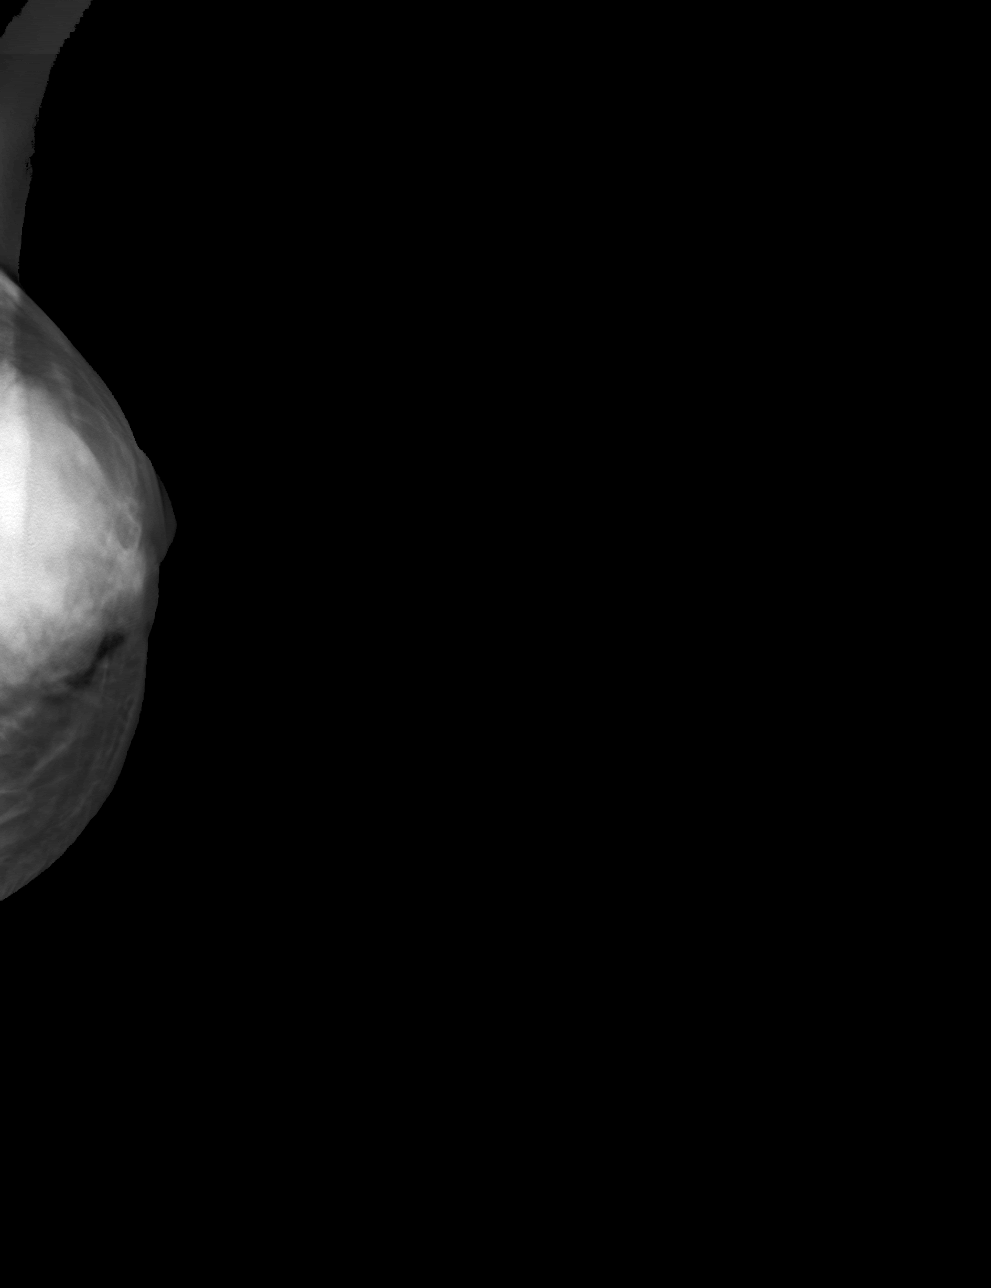

[4 of 12 positions shown; findings below may reference images not displayed]

FINDINGS: 3D Mammographic images were obtained following ultrasound guided
biopsies of the left breast and left axilla. Mammographic images
show a large mass with pleomorphic calcifications in the lateral
aspect of the breast. The posterior aspect of the mass was not fully
imaged. The ribbon shaped clip is not visualized on these images
likely secondary to it being in a more posterior location. The clip
in the left axilla is also not visualized on the images.
IMPRESSION: Status post ultrasound-guided core biopsies of the left breast and
the left axilla. Neither clip was visualized on the limited
mammographic images.

Final Assessment: Post Procedure Mammograms for Marker Placement

## 2022-05-16 IMAGING — US US BREAST BX W LOC DEV 1ST LESION IMG BX SPEC US GUIDE*L*
1 series · 10 of 10 positions shown · non-contrast
Comparison: Previous exam(s).
COMPARISON: Previous exam(s).

Addendum:
CLINICAL DATA: 88-year-old underwent malignant LEFT mastectomy
[DATE], grade 3 IDC and high-grade DCIS with lymphovascular
invasion and skin invasion with ulceration. Now with a 1.3 cm mass
at the LATERAL aspect of the mastectomy site. Punch biopsy
01/20/2020 = dermatitis.

EXAM:
ULTRASOUND GUIDED LEFT BREAST/MASTECTOMY SITE CORE NEEDLE BIOPSY

[Series 1: us breast bx w loc dev 1st lesion img bx spec us g · 0.07mm/px · 10 of 10 slices shown]
[im 1/10]
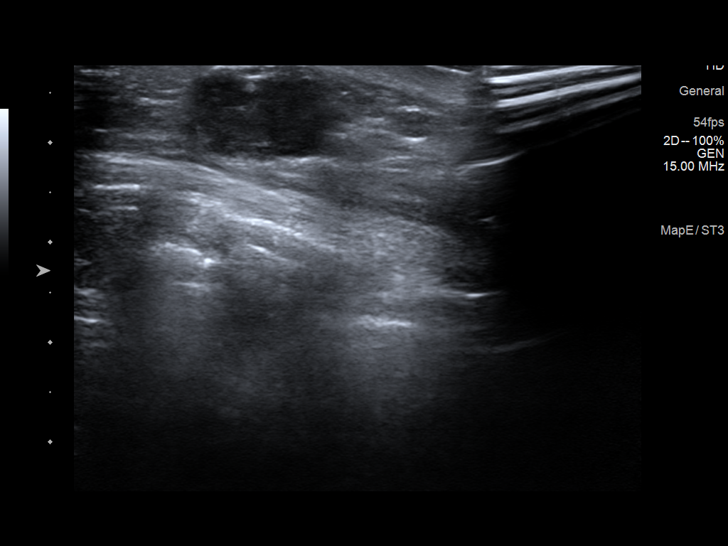
[im 2/10]
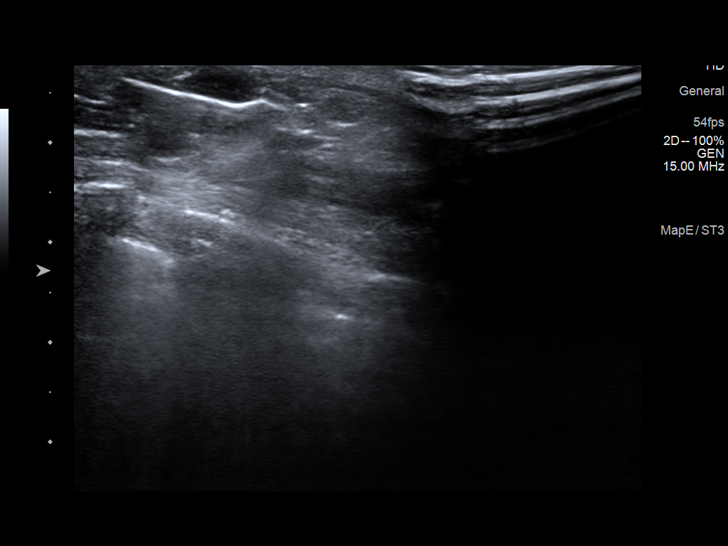
[im 3/10]
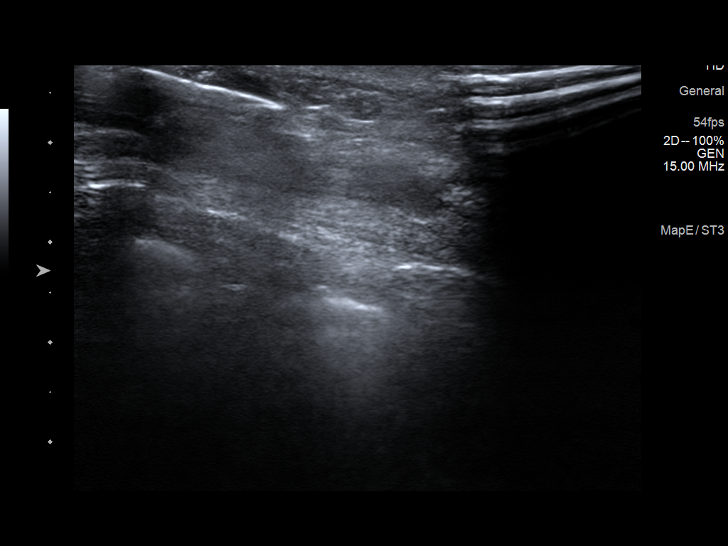
[im 4/10]
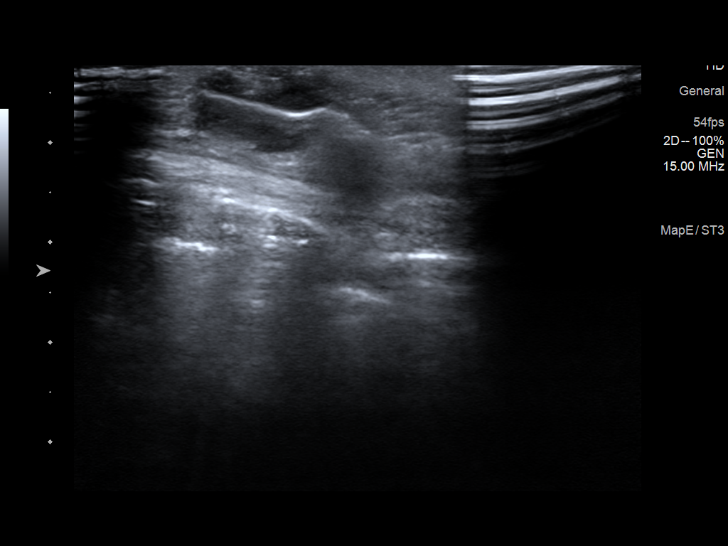
[im 5/10]
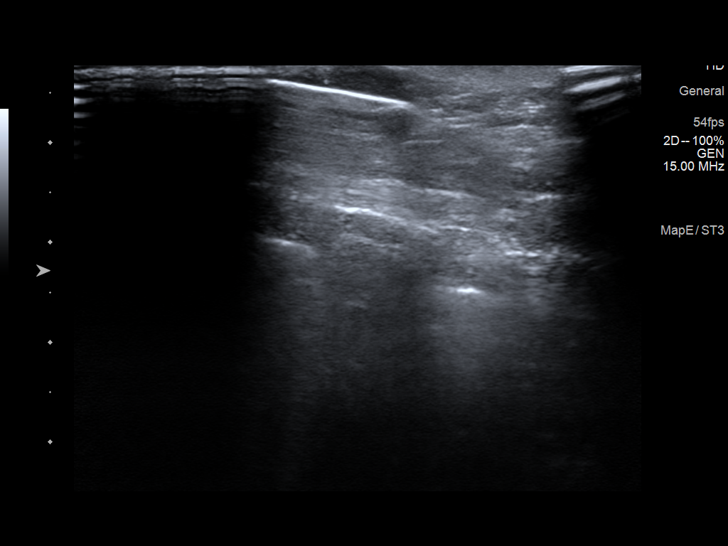
[im 6/10]
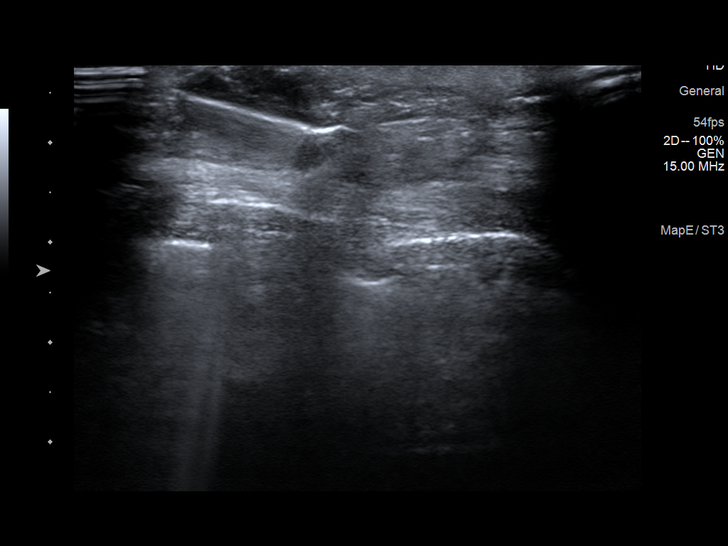
[im 7/10]
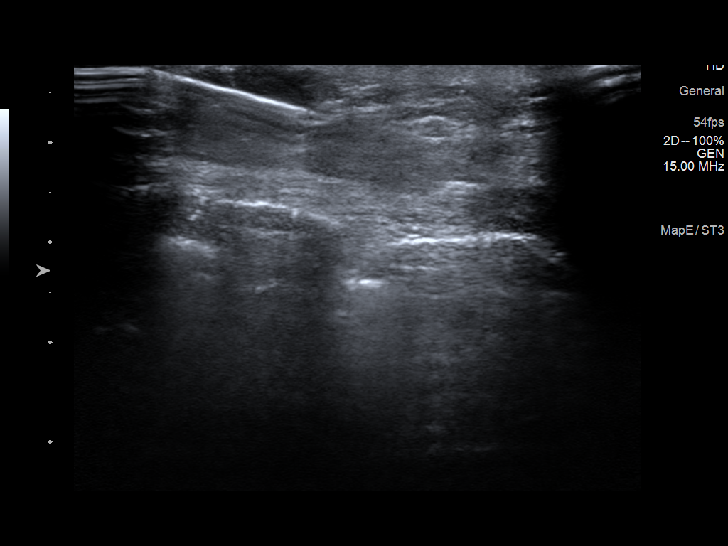
[im 8/10]
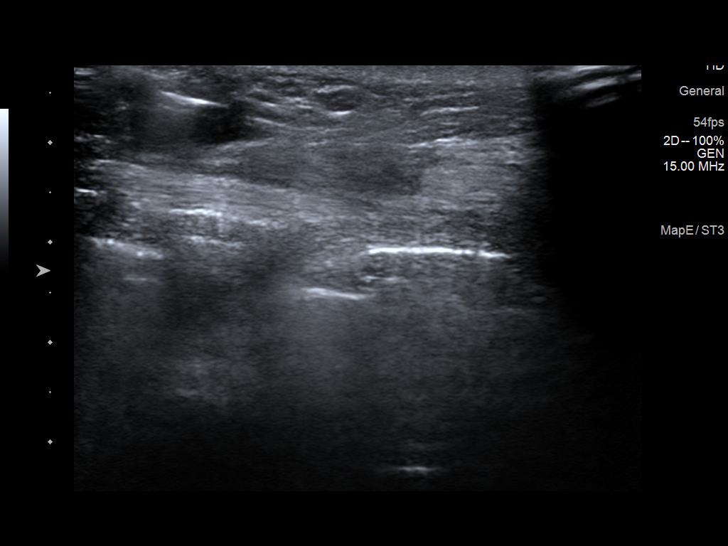
[im 9/10]
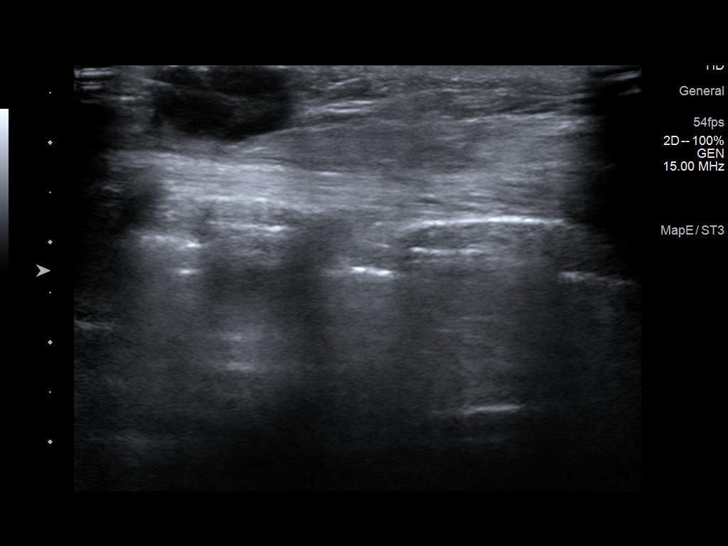
[im 10/10]
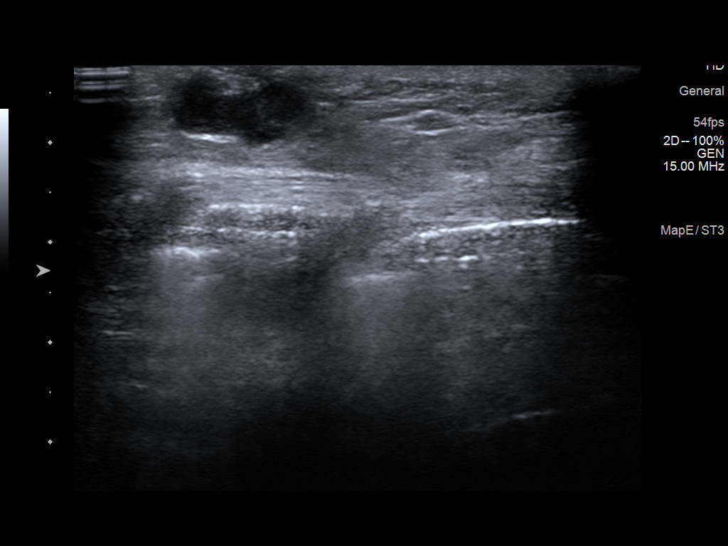

[10 of 10 positions shown; findings below may reference images not displayed]



Lesion quadrant: LATERAL aspect of the mastectomy site.

Using sterile technique with chlorhexidine as skin antisepsis, 1%
Lidocaine as local anesthetic, under direct ultrasound
visualization, a 14 gauge Denyse Cabanas core needle device was used
to perform biopsy of the subcutaneous mass involving the LEFT
mastectomy site using a medial approach. At the conclusion of the
procedure a ribbon shaped tissue marker clip was deployed into the
biopsy cavity.
IMPRESSION: Ultrasound guided biopsy of a subcutaneous mass involving the LEFT
breast mastectomy site. No apparent complications.

ADDENDUM:
Pathology revealed GRADE III INVASIVE DUCTAL CARCINOMA of the
lateral aspect of the mastectomy site. This was found to be
concordant by Dr. Poolscentre Pudev.

Pathology results were discussed with the patient by telephone and
reported to Nursing Supervisor Dm, Grassl patient request.
The patient reported doing well after the biopsy with tenderness at
the site. Post biopsy instructions and care were reviewed and
questions were answered. The patient was encouraged to call The

Follow up surgical consultation was scheduled with Dr. Don Lolito
Khirdine at [REDACTED] on February 17, 2020. Nursing
Supervisor Barbaro Story, RN reported this appointment will be
rescheduled at appropriate time.

Pathology results reported by Valeska Fok RN on 02/10/2020.



Lesion quadrant: LATERAL aspect of the mastectomy site.

Using sterile technique with chlorhexidine as skin antisepsis, 1%
Lidocaine as local anesthetic, under direct ultrasound
visualization, a 14 gauge Denyse Cabanas core needle device was used
to perform biopsy of the subcutaneous mass involving the LEFT
mastectomy site using a medial approach. At the conclusion of the
procedure a ribbon shaped tissue marker clip was deployed into the
biopsy cavity.
IMPRESSION: Ultrasound guided biopsy of a subcutaneous mass involving the LEFT
breast mastectomy site. No apparent complications.
